# Patient Record
Sex: Male | Born: 1955 | Race: White | Hispanic: No | Marital: Married | Smoking: Former smoker
Health system: Southern US, Community
[De-identification: ages and names within clinical notes are randomized; demographics above are authoritative.]

## PROBLEM LIST (undated history)

## (undated) DIAGNOSIS — I10 Essential (primary) hypertension: Secondary | ICD-10-CM

## (undated) DIAGNOSIS — Z87891 Personal history of nicotine dependence: Secondary | ICD-10-CM

## (undated) DIAGNOSIS — IMO0002 Reserved for concepts with insufficient information to code with codable children: Secondary | ICD-10-CM

## (undated) DIAGNOSIS — Z86010 Personal history of colonic polyps: Secondary | ICD-10-CM

## (undated) DIAGNOSIS — F1011 Alcohol abuse, in remission: Secondary | ICD-10-CM

## (undated) DIAGNOSIS — M1710 Unilateral primary osteoarthritis, unspecified knee: Secondary | ICD-10-CM

## (undated) DIAGNOSIS — T7840XA Allergy, unspecified, initial encounter: Secondary | ICD-10-CM

## (undated) DIAGNOSIS — R7302 Impaired glucose tolerance (oral): Secondary | ICD-10-CM

## (undated) DIAGNOSIS — E782 Mixed hyperlipidemia: Secondary | ICD-10-CM

## (undated) DIAGNOSIS — G479 Sleep disorder, unspecified: Secondary | ICD-10-CM

## (undated) DIAGNOSIS — N529 Male erectile dysfunction, unspecified: Secondary | ICD-10-CM

## (undated) DIAGNOSIS — S335XXA Sprain of ligaments of lumbar spine, initial encounter: Secondary | ICD-10-CM

## (undated) DIAGNOSIS — E119 Type 2 diabetes mellitus without complications: Secondary | ICD-10-CM

## (undated) DIAGNOSIS — Z0000 Encounter for general adult medical examination without abnormal findings: Secondary | ICD-10-CM

## (undated) DIAGNOSIS — J309 Allergic rhinitis, unspecified: Secondary | ICD-10-CM

## (undated) DIAGNOSIS — E669 Obesity, unspecified: Secondary | ICD-10-CM

## (undated) DIAGNOSIS — F1010 Alcohol abuse, uncomplicated: Secondary | ICD-10-CM

## (undated) DIAGNOSIS — Z9889 Other specified postprocedural states: Secondary | ICD-10-CM

## (undated) DIAGNOSIS — K279 Peptic ulcer, site unspecified, unspecified as acute or chronic, without hemorrhage or perforation: Secondary | ICD-10-CM

## (undated) DIAGNOSIS — R7989 Other specified abnormal findings of blood chemistry: Secondary | ICD-10-CM

## (undated) DIAGNOSIS — F17200 Nicotine dependence, unspecified, uncomplicated: Secondary | ICD-10-CM

## (undated) DIAGNOSIS — J40 Bronchitis, not specified as acute or chronic: Secondary | ICD-10-CM

## (undated) HISTORY — DX: Nicotine dependence, unspecified, uncomplicated: F17.200

## (undated) HISTORY — DX: Sleep disorder, unspecified: G47.9

## (undated) HISTORY — DX: Impaired glucose tolerance (oral): R73.02

## (undated) HISTORY — DX: Obesity, unspecified: E66.9

## (undated) HISTORY — DX: Other specified abnormal findings of blood chemistry: R79.89

## (undated) HISTORY — DX: Peptic ulcer, site unspecified, unspecified as acute or chronic, without hemorrhage or perforation: K27.9

## (undated) HISTORY — DX: Other specified postprocedural states: Z98.890

## (undated) HISTORY — DX: Personal history of colonic polyps: Z86.010

## (undated) HISTORY — DX: Essential (primary) hypertension: I10

## (undated) HISTORY — DX: Allergic rhinitis, unspecified: J30.9

## (undated) HISTORY — DX: Sprain of ligaments of lumbar spine, initial encounter: S33.5XXA

## (undated) HISTORY — DX: Bronchitis, not specified as acute or chronic: J40

## (undated) HISTORY — DX: Mixed hyperlipidemia: E78.2

## (undated) HISTORY — DX: Unilateral primary osteoarthritis, unspecified knee: M17.10

## (undated) HISTORY — DX: Encounter for general adult medical examination without abnormal findings: Z00.00

## (undated) HISTORY — DX: Alcohol abuse, uncomplicated: F10.10

## (undated) HISTORY — DX: Reserved for concepts with insufficient information to code with codable children: IMO0002

## (undated) HISTORY — DX: Male erectile dysfunction, unspecified: N52.9

## (undated) HISTORY — DX: Personal history of nicotine dependence: Z87.891

## (undated) HISTORY — DX: Alcohol abuse, in remission: F10.11

## (undated) HISTORY — DX: Type 2 diabetes mellitus without complications: E11.9

## (undated) HISTORY — DX: Allergy, unspecified, initial encounter: T78.40XA

---

## 1980-08-18 DIAGNOSIS — IMO0002 Reserved for concepts with insufficient information to code with codable children: Secondary | ICD-10-CM

## 1980-08-18 HISTORY — DX: Reserved for concepts with insufficient information to code with codable children: IMO0002

## 1980-08-18 HISTORY — PX: VASECTOMY: SHX75

## 1985-08-18 HISTORY — PX: OTHER SURGICAL HISTORY: SHX169

## 1998-08-18 HISTORY — PX: ROTATOR CUFF REPAIR: SHX139

## 2006-06-27 ENCOUNTER — Ambulatory Visit (HOSPITAL_COMMUNITY): Admission: RE | Admit: 2006-06-27 | Discharge: 2006-06-27 | Payer: Self-pay | Admitting: Orthopedic Surgery

## 2006-08-05 ENCOUNTER — Ambulatory Visit (HOSPITAL_BASED_OUTPATIENT_CLINIC_OR_DEPARTMENT_OTHER): Admission: RE | Admit: 2006-08-05 | Discharge: 2006-08-05 | Payer: Self-pay | Admitting: Emergency Medicine

## 2006-08-16 ENCOUNTER — Ambulatory Visit: Payer: Self-pay | Admitting: Internal Medicine

## 2006-08-18 HISTORY — PX: OTHER SURGICAL HISTORY: SHX169

## 2006-11-16 ENCOUNTER — Ambulatory Visit (HOSPITAL_COMMUNITY): Admission: RE | Admit: 2006-11-16 | Discharge: 2006-11-16 | Payer: Self-pay | Admitting: Orthopedic Surgery

## 2007-01-19 ENCOUNTER — Ambulatory Visit: Payer: Self-pay | Admitting: Internal Medicine

## 2007-02-15 ENCOUNTER — Encounter: Payer: Self-pay | Admitting: Internal Medicine

## 2007-02-15 DIAGNOSIS — E669 Obesity, unspecified: Secondary | ICD-10-CM | POA: Insufficient documentation

## 2007-02-15 DIAGNOSIS — Z9889 Other specified postprocedural states: Secondary | ICD-10-CM

## 2007-02-15 DIAGNOSIS — K279 Peptic ulcer, site unspecified, unspecified as acute or chronic, without hemorrhage or perforation: Secondary | ICD-10-CM | POA: Insufficient documentation

## 2007-02-15 DIAGNOSIS — I10 Essential (primary) hypertension: Secondary | ICD-10-CM | POA: Insufficient documentation

## 2007-02-15 DIAGNOSIS — J309 Allergic rhinitis, unspecified: Secondary | ICD-10-CM

## 2007-02-15 HISTORY — DX: Peptic ulcer, site unspecified, unspecified as acute or chronic, without hemorrhage or perforation: K27.9

## 2007-02-15 HISTORY — DX: Allergic rhinitis, unspecified: J30.9

## 2007-02-15 HISTORY — DX: Essential (primary) hypertension: I10

## 2007-02-15 HISTORY — DX: Obesity, unspecified: E66.9

## 2007-02-15 HISTORY — DX: Other specified postprocedural states: Z98.890

## 2007-05-04 ENCOUNTER — Ambulatory Visit: Payer: Self-pay | Admitting: Internal Medicine

## 2007-08-13 ENCOUNTER — Encounter: Payer: Self-pay | Admitting: Internal Medicine

## 2007-09-28 ENCOUNTER — Ambulatory Visit: Payer: Self-pay | Admitting: Internal Medicine

## 2007-09-28 DIAGNOSIS — R252 Cramp and spasm: Secondary | ICD-10-CM

## 2007-09-28 DIAGNOSIS — J209 Acute bronchitis, unspecified: Secondary | ICD-10-CM

## 2007-09-28 LAB — CONVERTED CEMR LAB
BUN: 7 mg/dL (ref 6–23)
CO2: 30 meq/L (ref 19–32)
Calcium: 9.1 mg/dL (ref 8.4–10.5)
GFR calc Af Amer: 132 mL/min
GFR calc non Af Amer: 109 mL/min
Glucose, Bld: 136 mg/dL — ABNORMAL HIGH (ref 70–99)
Potassium: 4.2 meq/L (ref 3.5–5.1)

## 2008-01-25 ENCOUNTER — Telehealth: Payer: Self-pay | Admitting: Internal Medicine

## 2008-01-28 ENCOUNTER — Ambulatory Visit: Payer: Self-pay | Admitting: Internal Medicine

## 2008-01-28 LAB — CONVERTED CEMR LAB
Albumin: 4.2 g/dL (ref 3.5–5.2)
Alkaline Phosphatase: 66 units/L (ref 39–117)
BUN: 13 mg/dL (ref 6–23)
Basophils Relative: 0.9 % (ref 0.0–1.0)
Bilirubin Urine: NEGATIVE
Calcium: 9.3 mg/dL (ref 8.4–10.5)
Creatinine, Ser: 0.8 mg/dL (ref 0.4–1.5)
Direct LDL: 101.3 mg/dL
Eosinophils Relative: 2.8 % (ref 0.0–5.0)
GFR calc Af Amer: 132 mL/min
Glucose, Bld: 119 mg/dL — ABNORMAL HIGH (ref 70–99)
HCT: 44.3 % (ref 39.0–52.0)
Hemoglobin: 15.3 g/dL (ref 13.0–17.0)
MCV: 95.7 fL (ref 78.0–100.0)
Monocytes Absolute: 0.7 10*3/uL (ref 0.1–1.0)
Monocytes Relative: 8.5 % (ref 3.0–12.0)
Neutro Abs: 4.3 10*3/uL (ref 1.4–7.7)
Nitrite: NEGATIVE
Potassium: 3.6 meq/L (ref 3.5–5.1)
Specific Gravity, Urine: >=1.03
TSH: 0.93 microintl units/mL (ref 0.35–5.50)
Total CHOL/HDL Ratio: 5.6
Total Protein: 7.2 g/dL (ref 6.0–8.3)
Urobilinogen, UA: 0.2
WBC: 8.3 10*3/uL (ref 4.5–10.5)

## 2008-02-07 ENCOUNTER — Ambulatory Visit: Payer: Self-pay | Admitting: Internal Medicine

## 2008-08-07 ENCOUNTER — Ambulatory Visit: Payer: Self-pay | Admitting: Internal Medicine

## 2008-08-07 DIAGNOSIS — J069 Acute upper respiratory infection, unspecified: Secondary | ICD-10-CM | POA: Insufficient documentation

## 2008-08-14 ENCOUNTER — Telehealth: Payer: Self-pay | Admitting: Family Medicine

## 2009-05-10 ENCOUNTER — Ambulatory Visit: Payer: Self-pay | Admitting: Internal Medicine

## 2009-05-10 DIAGNOSIS — L03119 Cellulitis of unspecified part of limb: Secondary | ICD-10-CM

## 2009-05-10 DIAGNOSIS — L02419 Cutaneous abscess of limb, unspecified: Secondary | ICD-10-CM | POA: Insufficient documentation

## 2010-07-03 ENCOUNTER — Telehealth: Payer: Self-pay | Admitting: Family Medicine

## 2010-07-04 ENCOUNTER — Ambulatory Visit: Payer: Self-pay | Admitting: Family Medicine

## 2010-07-04 DIAGNOSIS — R7309 Other abnormal glucose: Secondary | ICD-10-CM

## 2010-07-04 DIAGNOSIS — Z87891 Personal history of nicotine dependence: Secondary | ICD-10-CM

## 2010-07-04 DIAGNOSIS — E782 Mixed hyperlipidemia: Secondary | ICD-10-CM

## 2010-07-04 DIAGNOSIS — F17200 Nicotine dependence, unspecified, uncomplicated: Secondary | ICD-10-CM

## 2010-07-04 HISTORY — DX: Nicotine dependence, unspecified, uncomplicated: F17.200

## 2010-07-04 HISTORY — DX: Personal history of nicotine dependence: Z87.891

## 2010-07-04 HISTORY — DX: Mixed hyperlipidemia: E78.2

## 2010-07-05 ENCOUNTER — Telehealth: Payer: Self-pay | Admitting: Family Medicine

## 2010-09-17 NOTE — Progress Notes (Signed)
Summary: switch pcp  Phone Note Call from Patient Call back at [phone]   Caller: Patient Call For: Gordy Savers  MD Summary of Call: pt would like to switch from dr k to dr blyth. Can I switch pt? Initial call taken by: Heron Sabins,  July 05, 2010 3:45 PM  Follow-up for Phone Call        yes Follow-up by: Gordy Savers  MD,  July 05, 2010 4:03 PM

## 2010-09-17 NOTE — Progress Notes (Signed)
Summary: PCP Change  Phone Note Other Incoming   Summary of Call: Wife wants both of them to be seen By Dr. Abner Greenspan & has requested change of PCP 07-03-2010.  Initial call taken by: Georgian Co,  July 03, 2010 11:37 AM  Follow-up for Phone Call        ok Follow-up by: Gordy Savers  MD,  July 03, 2010 12:04 PM  Additional Follow-up for Phone Call Additional follow up Details #1::        ok,  Additional Follow-up by: Danise Edge MD,  July 03, 2010 1:55 PM

## 2010-09-17 NOTE — Assessment & Plan Note (Signed)
Summary: New To Est//alp   Vital Signs:  Patient profile:   55 year old male Height:      69 inches (175.26 cm) Weight:      232 pounds (105.45 kg) BMI:     34.38 O2 Sat:      94 % on Room air Temp:     98.7 degrees F (37.06 degrees C) oral Pulse rate:   118 / minute BP sitting:   178 / 94  (left arm) Cuff size:   regular  Vitals Entered By: Josph Macho RMA (July 04, 2010 9:45 AM)  O2 Flow:  Room air CC: Establish new pt/ sinus and chest congestion, cough w/ phlegm (green) X off and on for 2 months/ CF Is Patient Diabetic? No   History of Present Illness: Patient is a 75-year-old Caucasian male in  today for worsening and persistent cough. his wife has recently begun here in our practice and he has decided he would like to do the same. He is a persistent smoker though he has cut back significantly and now smokes less than a pack per day. He has been struggling with sinus and chest congestion for roughly 2 months now his cough and his nose are productive of greenish phlegm. He become short of breath very easily minimal exertion. He is struggling with low-grade fevers chills, malaise myalgias, wheezing. Notes dyspnea on exertion and worsening cough occur when he exerts himself. He reports a long history of tachycardia and can feel when his heart races especially with exertion. Denies chest pressure. Denies any recent sore throat or ear pain no headache no nausea, vomiting, diarrhea or constipation. He has been taking multiple medications including NyQuil daytime and nighttime preparations which he has taken the last 24 hours. He has been taking some Tylenol arthritis for occasional back pain as well.  Preventive Screening-Counseling & Management  Alcohol-Tobacco     Smoking Cessation Counseling: YES  Caffeine-Diet-Exercise     Does Patient Exercise: no      Drug Use:  no.    Problems Prior to Update: 1)  Tobacco Abuse  (ICD-305.1) 2)  Mixed Hyperlipidemia  (ICD-272.2) 3)   Hyperglycemia  (ICD-790.29) 4)  Cellulitis, Legs  (ICD-682.6) 5)  Uri  (ICD-465.9) 6)  Physical Examination  (ICD-V70.0) 7)  Leg Cramps  (ICD-729.82) 8)  Acute Bronchitis  (ICD-466.0) 9)  Nissen Fundoplication, Hx of  (ICD-V15.2) 10)  Exogenous Obesity  (ICD-278.00) 11)  Peptic Ulcer Disease  (ICD-533.90) 12)  Hypertension  (ICD-401.9) 13)  Allergic Rhinitis  (ICD-477.9)  Current Problems (verified): 1)  Mixed Hyperlipidemia  (ICD-272.2) 2)  Hyperglycemia  (ICD-790.29) 3)  Cellulitis, Legs  (ICD-682.6) 4)  Uri  (ICD-465.9) 5)  Physical Examination  (ICD-V70.0) 6)  Leg Cramps  (ICD-729.82) 7)  Acute Bronchitis  (ICD-466.0) 8)  Nissen Fundoplication, Hx of  (ICD-V15.2) 9)  Exogenous Obesity  (ICD-278.00) 10)  Peptic Ulcer Disease  (ICD-533.90) 11)  Hypertension  (ICD-401.9) 12)  Allergic Rhinitis  (ICD-477.9)  Medications Prior to Update: 1)  Lisinopril-Hydrochlorothiazide 20-25 Mg Tabs (Lisinopril-Hydrochlorothiazide) .Marland Kitchen.. 1 Once Daily 2)  Toprol Xl 100 Mg Tb24 (Metoprolol Succinate) .... Take 1 Tablet By Mouth Once A Day 3)  Chantix Starting Month Pak 0.5 Mg X 11 & 1 Mg X 42  Misc (Varenicline Tartrate) 4)  Cephalexin 500 Mg Caps (Cephalexin) .... One Twice Daily  Current Medications (verified): 1)  Lisinopril-Hydrochlorothiazide 20-25 Mg Tabs (Lisinopril-Hydrochlorothiazide) .Marland Kitchen.. 1 Once Daily 2)  Toprol Xl 100 Mg Tb24 (Metoprolol Succinate) .Marland KitchenMarland KitchenMarland Kitchen  Take 1 Tablet By Mouth Once A Day 3)  Chantix Starting Month Pak 0.5 Mg X 11 & 1 Mg X 42  Misc (Varenicline Tartrate) 4)  Cephalexin 500 Mg Caps (Cephalexin) .... 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 No apparent alopecia or balding. Eyes:  No corneal or conjunctival inflammation noted. EOMI.  Ears:  External ear exam shows no significant lesions or deformities.  Otoscopic examination reveals clear canals, tympanic membranes are intact bilaterally without bulging, retraction, inflammation or discharge. Hearing is grossly normal bilaterally. Nose:  External nasal examination shows no deformity or inflammation. Nasal mucosa are pink and moist without lesions or exudates. Mouth:  Oral mucosa and oropharynx without lesions or exudates.  Difficult to visualize posterior oropharynx. Neck:  No deformities, masses, or tenderness noted. Lungs:  no wheezes, R decreased breath sounds, and L decreased breath sounds.   Heart:  Normal rate and regular rhythm. S1 and S2 normal without gallop, murmur, click, rub or other extra sounds. Abdomen:  Bowel sounds positive,abdomen soft and non-tender without masses, organomegaly or hernias noted. Obese Msk:  No deformity or scoliosis noted of thoracic or lumbar spine.   Pulses:  R and L carotid,dorsalis pedis and posterior tibial pulses are full and equal bilaterally Extremities:  No clubbing, cyanosis, edema, or deformity noted  Neurologic:  No cranial nerve deficits noted. Station and gait are normal. Plantar reflexes are down-going bilaterally. DTRs are symmetrical throughout. Sensory, motor and  coordinative functions appear intact. Cervical Nodes:  No lymphadenopathy noted Psych:  Cognition and judgment appear intact. Alert and cooperative with normal attention span and concentration. No apparent delusions, illusions, hallucinations   Impression & Recommendations:  Problem # 1:  ACUTE BRONCHITIS (ICD-466.0)  The following medications were removed from the medication list:    Cephalexin 500 Mg Caps (Cephalexin) ..... One twice daily His updated medication list for this problem includes:    Proair Hfa 108 (90 Base) Mcg/act Aers (Albuterol sulfate) .Marland Kitchen... 1-2 puffs by mouth q 4-6 hours as needed sob/wheeze/cough    Cefdinir 300 Mg Caps (Cefdinir) .Marland Kitchen... 1 cap by mouth two times a day x 10 days    Mucinex 600 Mg Xr12h-tab (Guaifenesin) .Marland Kitchen... 1 tab by mouth two times a day x 10 days    Tussionex Pennkinetic Er 10-8 Mg/7ml Lqcr (Hydrocod polst-chlorphen polst) .Marland Kitchen... 1 tsp by mouth at bedtime as needed cough Call if any concerns.  Problem # 2:  TOBACCO ABUSE (ICD-305.1)  The following medications were removed from the medication list:    Chantix Starting Month Pak 0.5 Mg X 11 & 1 Mg X 42 Misc (Varenicline tartrate) His updated medication list for this problem includes:    Nicotrol 10 Mg Inha (Nicotine) .Marland KitchenMarland KitchenMarland KitchenMarland Kitchen  1 cartridge by mouth q 2 hours as needed cravings. max of 10 a day  Orders: Tobacco use cessation intermediate 3-10 minutes (16109) Patient is willing to try to quit smoking again.  Problem # 3:  MIXED HYPERLIPIDEMIA (ICD-272.2) Avoid trans fats and check an FLP prior to next visit  Problem # 4:  HYPERTENSION (ICD-401.9)  His updated medication list for this problem includes:    Lisinopril-hydrochlorothiazide 20-25 Mg Tabs (Lisinopril-hydrochlorothiazide) .Marland Kitchen... 1 once daily    Toprol Xl 100 Mg Tb24 (Metoprolol succinate) .Marland Kitchen... Take 1 tablet by mouth at bedtime with the 50 mg tab    Metoprolol Succinate 50 Mg Xr24h-tab (Metoprolol succinate) .Marland Kitchen... 1 tab by mouth at bedtime  with the 100mg  tab minimize sodium  Problem # 5:  HYPERGLYCEMIA (ICD-790.29) Avoid simple carbs, check renal panel prior to next visit  Complete Medication List: 1)  Lisinopril-hydrochlorothiazide 20-25 Mg Tabs (Lisinopril-hydrochlorothiazide) .Marland Kitchen.. 1 once daily 2)  Toprol Xl 100 Mg Tb24 (Metoprolol succinate) .... Take 1 tablet by mouth at bedtime with the 50 mg tab 3)  Proair Hfa 108 (90 Base) Mcg/act Aers (Albuterol sulfate) .Marland Kitchen.. 1-2 puffs by mouth q 4-6 hours as needed sob/wheeze/cough 4)  Cefdinir 300 Mg Caps (Cefdinir) .Marland Kitchen.. 1 cap by mouth two times a day x 10 days 5)  Mucinex 600 Mg Xr12h-tab (Guaifenesin) .Marland Kitchen.. 1 tab by mouth two times a day x 10 days 6)  Nicotrol 10 Mg Inha (Nicotine) .Marland Kitchen.. 1 cartridge by mouth q 2 hours as needed cravings. max of 10 a day 7)  Metoprolol Succinate 50 Mg Xr24h-tab (Metoprolol succinate) .Marland Kitchen.. 1 tab by mouth at bedtime with the 100mg  tab 8)  Tussionex Pennkinetic Er 10-8 Mg/80ml Lqcr (Hydrocod polst-chlorphen polst) .Marland Kitchen.. 1 tsp by mouth at bedtime as needed cough  Patient Instructions: 1)  Please schedule a follow-up appointment in 1 month or as needed 2)  Tobacco is very bad for your health and your loved ones ! You should stop smoking !  3)  Stop smoking tips: Choose a quit date. Cut down before the quit date. Decide what you will do as a substitute when you feel the urge to smoke(gum, toothpick, exercise).  4)  It is important that you exercise reguarly at least 20 minutes 5 times a week. If you develop chest pain, have severe difficulty breathing, or feel very tired, stop exercising immediately and seek medical attention.  5)  You need to lose weight. Consider a lower calorie diet and regular exercise.  6)  It is not healthy for men to drink more then 2-3 drinks per day or for women to drink more then 1-2 drinks per day.  7)  Take 650 - 1000 mg of tylenol every 4-6 hours as needed for relief of pain or comfort of fever. Avoid taking more than 4000 mg in a  24 hour period( can cause liver damage in higher doses).  8)  Take your antibiotic as prescribed until ALL of it is gone, but stop if you develop a rash or swelling and contact our office as soon as possible.  9)  Acute Bronchitis symptoms for less then 10 days are not  helped by antibiotics. Take over the counter cough medications. Call if no improvement in 5-7 days, sooner if increasing cough, fever, or new symptoms ( shortness of breath, chest pain) .  10)  BMP prior to visit, ICD-9: 401.9 11)  Hepatic Panel prior to visit ICD-9: 401.9 12)  Lipid panel prior to visit ICD-9 :  272.4 13)  TSH prior to visit ICD-9 : 401.9 14)  CBC w/ Diff prior to visit ICD-9 : 401.9 15)  Urine- dip prior to visit ICD-9 : 401.9 16)  PSA prior to visit ICD-9: v70.0 17)  HgBA1c prior to visit  ICD-9: 790.29 18)  Urine Microalbumin prior to visit ICD-9 : 790.29 19)  Minimize white or simple carbs Prescriptions: TUSSIONEX PENNKINETIC ER 10-8 MG/5ML LQCR (HYDROCOD POLST-CHLORPHEN POLST) 1 tsp by mouth at bedtime as needed cough  #4 oz x 1   Entered and Authorized by:   Danise Edge MD   Signed by:   Danise Edge MD on 07/04/2010   Method used:   Print then Give to Patient   RxID:   [id number] TOPROL XL 100 MG TB24 (METOPROLOL SUCCINATE) Take 1 tablet by mouth at bedtime with the 50 mg tab  #90 x 1   Entered and Authorized by:   Danise Edge MD   Signed by:   Danise Edge MD on 07/04/2010   Method used:   Electronically to        [facility] [address] [id number] (retail)       [address]       [place], [place]  [address]       Ph: [phone]       Fax: [phone]   RxID:   [id number] METOPROLOL SUCCINATE 50 MG XR24H-TAB (METOPROLOL SUCCINATE) 1 tab by mouth at bedtime with the 100mg  tab  #90 x 1   Entered and Authorized by:   Danise Edge MD   Signed by:   Danise Edge MD on 07/04/2010   Method used:   Electronically to        [id number] ([facility]ail)       [address]       [place], [place]  [address]       Ph: [phone]       Fax: [phone]   RxID:   [id number] NICOTROL 10 MG INHA (NICOTINE) 1 cartridge by mouth q 2 hours as needed cravings. Max of 10 a day  #300 x 1   Entered and Authorized by:   Danise Edge MD   Signed by:   Danise Edge MD on 07/04/2010   Method used:   Electronically to        [facility] [phone][facility] (retail)       [address]       [place], [place]  [address]       Ph: [phone]       Fax: [phone]   RxID:   [id number] CEFDINIR 300 MG CAPS (CEFDINIR) 1 cap by mouth two times a day x 10 days  #20 x 0   Entered and Authorized by:   Danise Edge MD   Signed by:   Danise Edge MD on 07/04/2010   Method used:   [facility]ronically to        [facility] [phone][facility] (retail)       [address]       [place], [place]  [address]       Ph: [phone]       Fax: [phone]   RxID:   [id number] PROAIR HFA 108 (90 BASE) MCG/ACT AERS (  ALBUTEROL SULFATE) 1-2 puffs by mouth q 4-6 hours as needed sob/wheeze/cough  #1 x 0   Entered and Authorized by:   Danise Edge MD   Signed by:   Danise Edge MD on 07/04/2010   Method used:   Samples Given   RxID:   [id number]    Orders Added: 1)  Tobacco use cessation intermediate 3-10 minutes [99406] 2)  Est. Patient Level IV [[address]]

## 2010-10-17 ENCOUNTER — Ambulatory Visit (INDEPENDENT_AMBULATORY_CARE_PROVIDER_SITE_OTHER): Payer: BC Managed Care – PPO | Admitting: Family Medicine

## 2010-10-17 ENCOUNTER — Encounter: Payer: Self-pay | Admitting: Family Medicine

## 2010-10-17 DIAGNOSIS — R05 Cough: Secondary | ICD-10-CM | POA: Insufficient documentation

## 2010-10-17 DIAGNOSIS — S335XXA Sprain of ligaments of lumbar spine, initial encounter: Secondary | ICD-10-CM

## 2010-10-17 DIAGNOSIS — F17200 Nicotine dependence, unspecified, uncomplicated: Secondary | ICD-10-CM

## 2010-10-17 DIAGNOSIS — Z23 Encounter for immunization: Secondary | ICD-10-CM

## 2010-10-17 HISTORY — DX: Sprain of ligaments of lumbar spine, initial encounter: S33.5XXA

## 2010-10-21 ENCOUNTER — Encounter: Payer: Self-pay | Admitting: *Deleted

## 2010-10-23 ENCOUNTER — Ambulatory Visit (HOSPITAL_BASED_OUTPATIENT_CLINIC_OR_DEPARTMENT_OTHER)
Admission: RE | Admit: 2010-10-23 | Discharge: 2010-10-23 | Disposition: A | Discharge status: Home / Self Care | Payer: BC Managed Care – PPO | Source: Ambulatory Visit | Attending: Family Medicine | Admitting: Family Medicine

## 2010-10-23 ENCOUNTER — Encounter: Payer: Self-pay | Admitting: Family Medicine

## 2010-10-23 ENCOUNTER — Other Ambulatory Visit: Payer: Self-pay | Admitting: Family Medicine

## 2010-10-23 DIAGNOSIS — M549 Dorsalgia, unspecified: Secondary | ICD-10-CM

## 2010-10-23 DIAGNOSIS — M47817 Spondylosis without myelopathy or radiculopathy, lumbosacral region: Secondary | ICD-10-CM | POA: Insufficient documentation

## 2010-10-24 DIAGNOSIS — E119 Type 2 diabetes mellitus without complications: Secondary | ICD-10-CM

## 2010-10-24 DIAGNOSIS — E1169 Type 2 diabetes mellitus with other specified complication: Secondary | ICD-10-CM | POA: Insufficient documentation

## 2010-10-24 DIAGNOSIS — E669 Obesity, unspecified: Secondary | ICD-10-CM | POA: Insufficient documentation

## 2010-10-24 HISTORY — DX: Type 2 diabetes mellitus without complications: E11.9

## 2010-10-24 LAB — CONVERTED CEMR LAB
ALT: 25 units/L (ref 0–53)
AST: 17 units/L (ref 0–37)
Alkaline Phosphatase: 88 units/L (ref 39–117)
BUN: 15 mg/dL (ref 6–23)
Bacteria, UA: NONE SEEN
Basophils Relative: 1 % (ref 0–1)
Blood, UA: NEGATIVE
Casts: NONE SEEN /lpf
Cholesterol: 237 mg/dL — ABNORMAL HIGH (ref 0–200)
Creatinine, Ser: 0.85 mg/dL (ref 0.40–1.50)
Eosinophils Absolute: 0.2 10*3/uL (ref 0.0–0.7)
Indirect Bilirubin: 0.6 mg/dL (ref 0.0–0.9)
MCHC: 33.9 g/dL (ref 30.0–36.0)
MCV: 94.6 fL (ref 78.0–100.0)
Monocytes Absolute: 0.7 10*3/uL (ref 0.1–1.0)
Monocytes Relative: 8 % (ref 3–12)
Neutrophils Relative %: 58 % (ref 43–77)
Potassium: 4.7 meq/L (ref 3.5–5.3)
RBC: 5.33 M/uL (ref 4.22–5.81)
Total Protein: 7.7 g/dL (ref 6.0–8.3)
Triglycerides: 281 mg/dL — ABNORMAL HIGH (ref <150)
Urine Glucose: >1000 mg/dL — AB
pH: 6 (ref 5.0–8.0)

## 2010-10-24 NOTE — Assessment & Plan Note (Signed)
Summary: pain in back radiating into leg also needs bloodwork/dt   Vital Signs:  Patient profile:   55 year old male Height:      69 inches Weight:      230 pounds BMI:     34.09 O2 Sat:      94 % on Room air Temp:     98.3 degrees F Pulse rate:   107 / minute BP sitting:   139 / 91  (right arm) Cuff size:   large  Vitals Entered By: Francee Piccolo CMA Duncan Dull) (October 17, 2010 8:55 AM)  O2 Flow:  Room air CC: back pain//SP Is Patient Diabetic? No   History of Present Illness: Patient is in today for follow up on multiple medical concerns. His breathing is much better, he is coughing less and breathing better. Is only smoking 3 cigarettes or less a day. He is denying CP/palp/SOB at rest/fevers/chills/GI or GU c/o. His biggest concern today is some low back pain which started about 3 days ago. He as working in the yard doing some heavy lifting and his lower back began to ache that night. it was around the upper lumbar region and radiated to b/l flanks. on Saturday morning he woke up with the pain wrapping around actually to his abdomen both sides as well. He denies any incontinence or radicular symptoms although he does note some long standing issues with achiong in b/l legs which is unchanged. He reports position changes can make the pain significantly better and worse. Certain twists or bending can bring on sharper pains and when he changes positions again it improves back to a duller achy feeling which has been more persistent. He is moving his bowels daily without difficulty no bloody or tarry stool.   Current Medications (verified): 1)  Lisinopril-Hydrochlorothiazide 20-25 Mg Tabs (Lisinopril-Hydrochlorothiazide) .Marland Kitchen.. 1 Once Daily 2)  Toprol Xl 100 Mg Tb24 (Metoprolol Succinate) .... Take 1 Tablet By Mouth At Bedtime With The 50 Mg Tab 3)  Proair Hfa 108 (90 Base) Mcg/act Aers (Albuterol Sulfate) .Marland Kitchen.. 1-2 Puffs By Mouth Q 4-6 Hours As Needed Sob/wheeze/cough 4)  Nicotrol 10 Mg Inha  (Nicotine) .Marland Kitchen.. 1 Cartridge By Mouth Q 2 Hours As Needed Cravings. Max of 10 A Day 5)  Metoprolol Succinate 50 Mg Xr24h-Tab (Metoprolol Succinate) .Marland Kitchen.. 1 Tab By Mouth At Bedtime With The 100mg  Tab  Allergies (verified): No Known Drug Allergies  Past History:  Past medical history reviewed for relevance to current acute and chronic problems. Social history (including risk factors) reviewed for relevance to current acute and chronic problems.  Past Medical History: Reviewed history from 02/07/2008 and no changes required. Allergic rhinitis Hypertension Obesity Peptic ulcer disease 1982 impaired glucose tolerance  Social History: Reviewed history from 07/04/2010 and no changes required. 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 No apparent alopecia or balding. Mouth:  Oral mucosa and oropharynx without lesions or exudates.  Teeth in good repair. Lungs:  Normal respiratory effort, chest expands symmetrically. Lungs are clear to auscultation, no crackles or wheezes. Heart:  Normal rate and regular rhythm. S1 and S2 normal without gallop, murmur, click, rub or other extra sounds. Abdomen:  Bowel sounds positive,abdomen soft  and non-tender without masses, organomegaly or hernias noted. Extremities:  No clubbing, cyanosis, edema, or deformity noted with normal full range of motion of all joints.   Psych:  Cognition and judgment appear intact. Alert and cooperative with normal attention span and concentration. No apparent delusions, illusions, hallucinations   Impression & Recommendations:  Problem # 1:  BACK PAIN, LUMBAR  (ICD-724.2)  His updated medication list for this problem includes:    Cyclobenzaprine Hcl 10 Mg Tabs (Cyclobenzaprine hcl) .Marland Kitchen... 1 tab by mouth three times a day as needed pain may cause sedation so reserve for at bedtime use on  most days  Orders: T-PSA ([address]-60454) T-CBC w/Diff (09811-91478) T-Urinalysis (29562-13086) Radiology Referral (Radiology) Given 2 sample boxes Flector patches, to try and can alternate this with advil depending which one gives him relief. Call if symptoms worsen  Problem # 2:  TOBACCO ABUSE (ICD-305.1)  His updated medication list for this problem includes:    Nicotrol 10 Mg Inha (Nicotine) .Marland Kitchen... 1 cartridge by mouth q 2 hours as needed cravings. max of 10 a day Has cut down to roughly 3 or less cigarettes daily, he is committed to quitting completely as he is feeling better since he cut back  Orders: Tobacco use cessation intermediate 3-10 minutes (57846)  Problem # 3:  MIXED HYPERLIPIDEMIA (ICD-272.2)  Orders: T-Lipid Profile (96295-28413) Avoid trans fats and recheck levels. encouraged increased activity  Problem # 4:  HYPERTENSION (ICD-401.9)  His updated medication list for this problem includes:    Lisinopril-hydrochlorothiazide 20-25 Mg Tabs (Lisinopril-hydrochlorothiazide) .Marland Kitchen... 1 once daily    Toprol Xl 100 Mg Tb24 (Metoprolol succinate) .Marland Kitchen... Take 1 tablet by mouth at bedtime with the 50 mg tab    Metoprolol Succinate 50 Mg Xr24h-tab (Metoprolol succinate) .Marland Kitchen... 1 tab by mouth at bedtime with the 100mg  tab  Orders: T-Basic Metabolic Panel ([id number]) T-Hepatic Function [id number]) Well controlled, no changes  Problem # 5:  Preventive Health Care (ICD-V70.0) Given Tdap and flu shot today  Complete Medication List: 1)  Lisinopril-hydrochlorothiazide 20-25 Mg Tabs (Lisinopril-hydrochlorothiazide) .Marland Kitchen.. 1 once daily 2)  Toprol Xl 100 Mg Tb24 (Metoprolol succinate) .... Take 1 tablet by mouth at bedtime with the 50 mg tab 3)  Proair  Hfa 108 (90 Base) Mcg/act Aers (Albuterol sulfate) .Marland Kitchen.. 1-2 puffs by mouth q 4-6 hours as needed sob/wheeze/cough 4)  Nicotrol 10 Mg Inha (Nicotine) .Marland Kitchen.. 1 cartridge by mouth q 2 hours as needed cravings. max of 10 a day 5)  Metoprolol Succinate 50 Mg Xr24h-tab (Metoprolol succinate) .Marland Kitchen.. 1 tab by mouth at bedtime with the 100mg  tab 6)  Cyclobenzaprine Hcl 10 Mg Tabs (Cyclobenzaprine hcl) .Marland Kitchen.. 1 tab by mouth three times a day as needed pain may cause sedation so reserve for at bedtime use on  most days 7)  Flector 1.3 % Ptch (Diclofenac epolamine) .Marland Kitchen.. 1 patch to affected area topically q 12 hours as needed  Other Orders: T-TSH ([address]956-38756) Tdap => 46yrs IM (43329) Admin 1st Vaccine (51884) Flu Vaccine 11yrs + (16606) Admin of Any Addtl Vaccine (30160)  Patient Instructions: 1)  Please schedule a follow-up appointment in 1 to 2 months 2)  apply moist heat and stretch daily as tolerated Prescriptions: FLECTOR 1.3 % PTCH (DICLOFENAC EPOLAMINE) 1 patch to affected area topically q 12 hours as needed  #60 x 1   Entered and Authorized by:   Danise Edge MD   Signed by:   Danise Edge MD on 10/17/2010   Method used:   [facility] to  [id number]       [address]       [place], [place]  [address]       Ph: [phone]       Fax: [phone]   RxID:   [id number] CYCLOBENZAPRINE HCL 10 MG TABS (CYCLOBENZAPRINE HCL) 1 tab by mouth three times a day as needed pain May cause sedation so reserve for at bedtime use on  most days  #40 x 1   Entered and Authorized by:   Danise Edge MD   Signed by:   Danise Edge MD on 10/17/2010   Method used:   [facility] to        [facility] [phone]* (retail)       [address]       [place], [place]  [address]       Ph: [phone]       Fax: [phone]   RxID:   [id number]    Orders Added: 1)  T-Basic Metabolic Panel [phone] 2)  T-Hepatic  Function [phone] 3)  T-Lipid Profile [80061-22930] 4)  T-TSH [37628-31517] 5)  T-PSA [61607-37106] 6)  T-CBC w/Diff [26948-54627] 7)  T-Urinalysis [81003-65000] 8)  Tdap => 32yrs IM [90715] 9)  Admin 1st Vaccine [90471] 10)  Flu Vaccine 59yrs + [03500] 11)  Admin of Any Addtl Vaccine [93818] 12)  Radiology Referral [Radiology] 13)  Tobacco use cessation intermediate 3-10 minutes [99406]   Immunizations Administered:  Tetanus Vaccine:    Vaccine Type: Tdap    Site: right deltoid    Mfr: GlaxoSmithKline    Dose: 0.5 ml    Route: IM    Given by: Francee Piccolo CMA (AAMA)    Exp. Date: 06/06/2012    Lot #: [id number]  Influenza Vaccine # 1:    Vaccine Type: Fluvax 3+    Site: left deltoid    Mfr: Sanofi Pasteur    Dose: 0.5 ml    Route: IM    Given by: Francee Piccolo CMA (AAMA)    Exp. Date: 02/15/2011    Lot #: [id number]    VIS given: 03/12/10 version given October 17, 2010.  Flu Vaccine Consent Questions:    Do you have a history of severe allergic reactions to this vaccine? no    Any prior history of allergic reactions to egg and/or gelatin? no    Do you have a sensitivity to the preservative Thimersol? no    Do you have a past history of Guillan-Barre Syndrome? no    Do you currently have an acute febrile illness? no    Have you ever had a severe reaction to latex? no    Vaccine information given and explained to patient? yes

## 2010-10-25 ENCOUNTER — Other Ambulatory Visit: Payer: Self-pay | Admitting: Family Medicine

## 2010-10-29 ENCOUNTER — Ambulatory Visit (INDEPENDENT_AMBULATORY_CARE_PROVIDER_SITE_OTHER): Payer: BC Managed Care – PPO | Admitting: Family Medicine

## 2010-10-29 ENCOUNTER — Encounter: Payer: Self-pay | Admitting: Family Medicine

## 2010-10-29 DIAGNOSIS — E782 Mixed hyperlipidemia: Secondary | ICD-10-CM

## 2010-10-29 DIAGNOSIS — S335XXA Sprain of ligaments of lumbar spine, initial encounter: Secondary | ICD-10-CM

## 2010-10-29 DIAGNOSIS — E119 Type 2 diabetes mellitus without complications: Secondary | ICD-10-CM

## 2010-10-29 DIAGNOSIS — F17200 Nicotine dependence, unspecified, uncomplicated: Secondary | ICD-10-CM

## 2010-10-29 LAB — CONVERTED CEMR LAB: Hgb A1c MFr Bld: 11.7 % — ABNORMAL HIGH (ref <5.7)

## 2010-11-04 ENCOUNTER — Encounter: Payer: Self-pay | Admitting: Family Medicine

## 2010-11-05 NOTE — Assessment & Plan Note (Signed)
Summary: Fu sugars/dt   Vital Signs:  Patient profile:   55 year old male Height:      69 inches (175.26 cm) Weight:      230.50 pounds (104.77 kg) O2 Sat:      92 % on Room air Temp:     98.6 degrees F (37.00 degrees C) oral Pulse rate:   112 / minute BP sitting:   140 / 82  (right arm) Cuff size:   large  Vitals Entered By: Josph Macho RMA (October 29, 2010 8:35 AM)  O2 Flow:  Room air CC: Follow-up visit on sugar levels/ CF Is Patient Diabetic? Yes   History of Present Illness:  patient is a 55 year old Caucasian male who is in today to discuss options regarding his new diagnosis of diabetes mellitus. His recent blood work showed a random blood sugar of 322 and hemoglobin A1c of 11.7. He is developing some persistent fatigue with that that was related to his activity level. He does acknowledge heavy water and soda consumption ut again thought that was related to his other medications. He denies  abdominal pain urinary symptoms or change in bowels. He is having persistent low back discomfort with some left hip pain it is helped tremendously with topical patches. No radicular symptoms or incontinence. he started metformin as directed the other night and is tolerating that fine. Denies any diarrhea Donnell upset. is in agreement to start checking sugars and decreasing his carbs. he did quit smoking in November 2011 although he doesn't knowledge he has she did on a couple of occasions denies chest pain, palpitations, shortness of breath, fevers, chills, recent illness GI or GU complaints.  Current Medications (verified): 1)  Lisinopril-Hydrochlorothiazide 20-25 Mg Tabs (Lisinopril-Hydrochlorothiazide) .Marland Kitchen.. 1 Once Daily 2)  Toprol Xl 100 Mg Tb24 (Metoprolol Succinate) .... Take 1 Tablet By Mouth At Bedtime With The 50 Mg Tab 3)  Proair Hfa 108 (90 Base) Mcg/act Aers (Albuterol Sulfate) .Marland Kitchen.. 1-2 Puffs By Mouth Q 4-6 Hours As Needed Sob/wheeze/cough 4)  Nicotrol 10 Mg Inha (Nicotine) .Marland Kitchen.. 1  Cartridge By Mouth Q 2 Hours As Needed Cravings. Max of 10 A Day 5)  Metoprolol Succinate 50 Mg Xr24h-Tab (Metoprolol Succinate) .Marland Kitchen.. 1 Tab By Mouth At Bedtime With The 100mg  Tab 6)  Cyclobenzaprine Hcl 10 Mg Tabs (Cyclobenzaprine Hcl) .Marland Kitchen.. 1 Tab By Mouth Three Times A Day As Needed Pain May Cause Sedation So Reserve For At Bedtime Use On  Most Days 7)  Flector 1.3 % Ptch (Diclofenac Epolamine) .Marland Kitchen.. 1 Patch To Affected Area Topically Q 12 Hours As Needed 8)  Metformin Hcl 500 Mg Tabs (Metformin Hcl) .... Two Times A Day  Allergies (verified): No Known Drug Allergies  Past History:  Past medical history reviewed for relevance to current acute and chronic problems. Social history (including risk factors) reviewed for relevance to current acute and chronic problems.  Past Medical History: Reviewed history from 02/07/2008 and no changes required. Allergic rhinitis Hypertension Obesity Peptic ulcer disease 1982 impaired glucose tolerance  Social History: Reviewed history from 07/04/2010 and no changes required. Married Current Smoker  3/4 to 1 ppd, quit 11/11 exc for occasional slip ups Occupation: ar Audiological scientist Alcohol use-yes, 3-5 daily, beer Drug use-no Regular exercise-no, gardens, physical work  Review of Systems      See HPI  Physical Exam  General:  Well-developed,well-nourished,in no acute distress; alert,appropriate and cooperative throughout examination Head:  Normocephalic and atraumatic without obvious abnormalities. No apparent alopecia or balding.  Mouth:  Oral mucosa and oropharynx without lesions or exudates.  Teeth in good repair. Neck:  No deformities, masses, or tenderness noted. Lungs:  Normal respiratory effort, chest expands symmetrically. Lungs are clear to auscultation, no crackles or wheezes. Heart:  Normal rate and regular rhythm. S1 and S2 normal without gallop, murmur, click, rub or other extra sounds. Abdomen:  Bowel sounds  positive,abdomen soft and non-tender without masses, organomegaly or hernias noted. Extremities:  No clubbing, cyanosis, edema, or deformity noted with normal full range of motion of all joints.   Psych:  Cognition and judgment appear intact. Alert and cooperative with normal attention span and concentration. No apparent delusions, illusions, hallucinations   Impression & Recommendations:  Problem # 1:  DM (ICD-250.00)  His updated medication list for this problem includes:    Lisinopril-hydrochlorothiazide 20-25 Mg Tabs (Lisinopril-hydrochlorothiazide) .Marland Kitchen... 1 once daily    Metformin Hcl 500 Mg Tabs (Metformin hcl) .Marland Kitchen..Marland Kitchen Two times a day    Tradjenta 5 Mg Tabs (Linagliptin) .Marland Kitchen... 1 tab by mouth two times a day Given a Freestyle Lite meter and asked to check his sugars two times a day and as needed then record and bring lob in to visit in 2 weeks, given a handout on the Dash diet and asked to avoid simple carbs, did offer a Nutrition consult but patient declined at this time.  Problem # 2:  LUMBAR STRAIN (ICD-847.2) Patient is simproving, no heavy lifting, continue topical treatments and notify us if symptoms worsen  Problem # 3:  TOBACCO ABUSE (ICD-305.1)  His updated medication list for this problem includes:    Nicotrol 10 Mg Inha (Nicotine) .Marland Kitchen... 1 cartridge by mouth q 2 hours as needed cravings. max of 10 a day Needs complete and permanent cessation, patient is in agreement he will try to proceed with this plan  Problem # 4:  MIXED HYPERLIPIDEMIA (ICD-272.2) Avoid trans fats, add fish oil and likely start a low dose statin at the next visit  Complete Medication List: 1)  Lisinopril-hydrochlorothiazide 20-25 Mg Tabs (Lisinopril-hydrochlorothiazide) .Marland Kitchen.. 1 once daily 2)  Toprol Xl 100 Mg Tb24 (Metoprolol succinate) .... Take 1 tablet by mouth at bedtime with the 50 mg tab 3)  Proair Hfa 108 (90 Base) Mcg/act Aers (Albuterol sulfate) .Marland Kitchen.. 1-2 puffs by mouth q 4-6 hours as needed  sob/wheeze/cough 4)  Nicotrol 10 Mg Inha (Nicotine) .Marland Kitchen.. 1 cartridge by mouth q 2 hours as needed cravings. max of 10 a day 5)  Metoprolol Succinate 50 Mg Xr24h-tab (Metoprolol succinate) .Marland Kitchen.. 1 tab by mouth at bedtime with the 100mg  tab 6)  Cyclobenzaprine Hcl 10 Mg Tabs (Cyclobenzaprine hcl) .Marland Kitchen.. 1 tab by mouth three times a day as needed pain may cause sedation so reserve for at bedtime use on  most days 7)  Flector 1.3 % Ptch (Diclofenac epolamine) .Marland Kitchen.. 1 patch to affected area topically q 12 hours as needed 8)  Metformin Hcl 500 Mg Tabs (Metformin hcl) .... Two times a day 9)  Tradjenta 5 Mg Tabs (Linagliptin) .Marland Kitchen.. 1 tab by mouth two times a day 10)  Freestyle Lancets Misc (Lancets) .... Two times a day 11)  Freestyle Test Strp (Glucose blood) .... Two times a day  Patient Instructions: 1)  Please schedule a follow-up appointment in 2 weeks.  2)  Check your blood sugars regularly. If your readings are usually above:  300 or below 70 you should contact our office.  3)  See your eye doctor yearly to check for diabetic eye  damage. 4)  Check your feet each night  for sore areas, calluses or signs of infection.  5)  Avoid soda and minimize carbs, use brown carbs instead of white  6)  Check Blood sugars in am prior to eating and in evening 1-2 hours after eating and record bring numbers to next visit. 7)  Try the Dash Diet. 8)  No Smoking 9)  No Trans fats Prescriptions: FREESTYLE TEST  STRP (GLUCOSE BLOOD) two times a day  #60 x 3   Entered by:   Josph Macho RMA   Authorized by:   Danise Edge MD   Signed by:   Josph Macho RMA on 10/29/2010   Method used:   Electronically to        [facility]  [address] [phone][facility] (retail)       [address]       [place], [place]  [address]       Ph: [phone]       Fax: [phone]   RxID:   [id number] FREESTYLE LANCETS  MISC (LANCETS) two times a day  #60 x 3   Entered by:   Josph Macho RMA   Authorized by:    Danise Edge MD   Signed by:   Josph Macho RMA on 10/29/2010   Method used:   [facility]ronically to        [facility]  [address] [id number] (retail)       [address]       [place], [place]  [address]       Ph: [phone]       Fax: [phone]   RxID:   [id number] TRADJENTA 5 MG TABS (LINAGLIPTIN) 1 tab by mouth two times a day  #60 x 1   Entered and Authorized by:   Danise Edge MD   Signed by:   Danise Edge MD on 10/29/2010   Method used:   [facility]ronically to        [facility]  [address] [phone][facility] (retail)       [address]       [place], [place]  [address]       Ph: [phone]       Fax: [phone]   RxID:   [id number]    Orders Added: 1)  Est. Patient Level IV [37106]

## 2010-11-11 ENCOUNTER — Other Ambulatory Visit: Payer: Self-pay

## 2010-11-11 MED ORDER — METFORMIN HCL 500 MG PO TABS: 500.0000 mg | ORAL_TABLET | Freq: Two times a day (BID) | ORAL | Status: DC

## 2010-11-20 ENCOUNTER — Ambulatory Visit (INDEPENDENT_AMBULATORY_CARE_PROVIDER_SITE_OTHER): Payer: BC Managed Care – PPO | Admitting: Family Medicine

## 2010-11-20 ENCOUNTER — Encounter: Payer: Self-pay | Admitting: Family Medicine

## 2010-11-20 DIAGNOSIS — K279 Peptic ulcer, site unspecified, unspecified as acute or chronic, without hemorrhage or perforation: Secondary | ICD-10-CM

## 2010-11-20 DIAGNOSIS — E782 Mixed hyperlipidemia: Secondary | ICD-10-CM

## 2010-11-20 DIAGNOSIS — J309 Allergic rhinitis, unspecified: Secondary | ICD-10-CM

## 2010-11-20 DIAGNOSIS — E669 Obesity, unspecified: Secondary | ICD-10-CM

## 2010-11-20 DIAGNOSIS — E119 Type 2 diabetes mellitus without complications: Secondary | ICD-10-CM

## 2010-11-20 DIAGNOSIS — S335XXA Sprain of ligaments of lumbar spine, initial encounter: Secondary | ICD-10-CM

## 2010-11-20 DIAGNOSIS — I10 Essential (primary) hypertension: Secondary | ICD-10-CM

## 2010-11-20 DIAGNOSIS — F17200 Nicotine dependence, unspecified, uncomplicated: Secondary | ICD-10-CM

## 2010-11-20 MED ORDER — METFORMIN HCL 500 MG PO TABS: 500.0000 mg | ORAL_TABLET | Freq: Three times a day (TID) | ORAL | Status: DC

## 2010-11-20 NOTE — Assessment & Plan Note (Signed)
May use Loratadine prn if symptoms recur

## 2010-11-20 NOTE — Assessment & Plan Note (Signed)
Patient encouraged to attempt moderate weight loss, avoid simple carbs and trans fats, eat small and frequent meals and increase exercise

## 2010-11-20 NOTE — Progress Notes (Signed)
Phillip Doyle [id number] 1956/03/03 11/20/2010      Progress Note-Follow Up  Subjective  Chief Complaint  Chief Complaint  Patient presents with  . Blood Sugar Problem    follow up    HPI  Patient is in today for followup on his newly diagnosed diabetes and pain. He notes his pain is overall improved he does still have some stiffness and pain in his knees but finds this tolerable. Is not taking any significant amount of pain meds for this at this time. No significant back pain at present. He brings in his sugars his sugars range from 143-88. Most numbers in the a.m. are between 150 and 200. He denies polyuria polydipsia. He notes his fatigue is significantly improved at this time. Otherwise he reports he is doing well. He has cut down to very few cigarettes. He does note he had one this morning but did not have one yesterday. Says a pack lasts him a month at this point. He reports his breathing is better his cough is improved. He is no longer needing his albuterol frequently. He denies fevers, chills, congestion, allergic rhinitis. He denies chest pain, palpitations, shortness of breath, GI or GU complaints.  Past Medical History  Diagnosis Date  . Allergy     rhinitis  . Hypertension   . Obesity   . Ulcer 1982    peptic ulcer disease  . Impaired glucose tolerance   . Diabetes mellitus   . TOBACCO ABUSE 07/04/2010  . PEPTIC ULCER DISEASE 02/15/2007  . NISSEN FUNDOPLICATION, HX OF 02/15/2007  . Mixed hyperlipidemia 07/04/2010  . LUMBAR STRAIN 10/17/2010  . HYPERTENSION 02/15/2007  . EXOGENOUS OBESITY 02/15/2007  . DM 10/24/2010  . ALLERGIC RHINITIS 02/15/2007    Past Surgical History  Procedure Date  . Rotator cuff repair 2000  . Vasectomy 1982  . Fundiplication for hh and reflux 1987  . Negative stress test 2008    Family History  Problem Relation Age of Onset  . Asthma Mother   . Cancer Mother     uterine   . Alcohol abuse Father   . Stroke Maternal Grandmother     possibly    . Hypertension Maternal Grandfather   . Hypertension Brother   . Cancer Brother     prostate    History   Social History  . Marital Status: Married    Spouse Name: N/A    Number of Children: N/A  . Years of Education: N/A   Occupational History  . Not on file.   Social History Main Topics  . Smoking status: Former Smoker -- 1.0 packs/day    Quit date: 06/18/2010  . Smokeless tobacco: Never Used  . Alcohol Use: 21.0 oz/week    35 Cans of beer per week     3-5 beer daily  . Drug Use: No  . Sexually Active: Yes -- Male partner(s)   Other Topics Concern  . Not on file   Social History Narrative  . No narrative on file    Current Outpatient Prescriptions on File Prior to Visit  Medication Sig Dispense Refill  . cyclobenzaprine (FLEXERIL) 10 MG tablet Take 10 mg by mouth 3 (three) times daily as needed. For pain. May cause sedation so reserve for at bedtime use on most days       . Linagliptin (TRADJENTA) 5 MG TABS Take by mouth 2 (two) times daily.        Marland Kitchen lisinopril-hydrochlorothiazide (PRINZIDE,ZESTORETIC) 20-25 MG per tablet Take 1  tablet by mouth daily.        . metoprolol (TOPROL-XL) 100 MG 24 hr tablet Take 100 mg by mouth at bedtime. Take 1 tablet po qhs w/ the 50 mg tab       . metoprolol (TOPROL-XL) 50 MG 24 hr tablet Take 50 mg by mouth daily. Take 1 tablet po qhs with the 100 mg       . nicotine (NICOTROL) 10 MG inhaler Inhale 1 puff into the lungs as needed. 1 cartridge po q 2 hours prn cravings. Max of 10 a day       . NON FORMULARY Freestyle Lancets misc- two times a day       . NON FORMULARY Freestyle test strips- bid        . DISCONTD: metFORMIN (GLUCOPHAGE) 500 MG tablet Take 1 tablet (500 mg total) by mouth 2 (two) times daily with a meal.  60 tablet  2  . albuterol (PROAIR HFA) 108 (90 BASE) MCG/ACT inhaler Inhale 2 puffs into the lungs every 6 (six) hours as needed. For sob/ wheeze/ cough       . Diclofenac Epolamine (FLECTOR) 1.3 % PTCH Place onto  the skin. 1 patch to affected area topically q 12 hours as needed         No Known Allergies  Review of Systems  Review of Systems  Constitutional: Negative for fever and malaise/fatigue.  HENT: Negative for congestion.   Eyes: Negative for discharge.  Respiratory: Negative for shortness of breath.   Cardiovascular: Negative for chest pain, palpitations and leg swelling.  Gastrointestinal: Negative for nausea, abdominal pain and diarrhea.  Genitourinary: Negative for dysuria.  Musculoskeletal: Positive for joint pain. Negative for falls.       [B/l knees stiff and painful intermittently Skin: Negative for rash.  Neurological: Negative for loss of consciousness and headaches.  Endo/Heme/Allergies: Negative for polydipsia.  Psychiatric/Behavioral: Negative for depression and suicidal ideas. The patient is not nervous/anxious and does not have insomnia.     Objective  BP 140/82  Pulse 110  Temp(Src) 98.8 F (37.1 C) (Oral)  Ht 5\' 9"  (1.753 m)  Wt 232 lb 3.2 oz (105.325 kg)  BMI 34.29 kg/m2  SpO2 96%  Physical Exam  Physical Exam  Constitutional: He is oriented to person, place, and time. He appears well-developed and well-nourished. No distress.  HENT:  Nose: Nose normal.  Eyes: Conjunctivae are normal. Right eye exhibits discharge. Left eye exhibits no discharge.  Neck: Normal range of motion.  Cardiovascular: Normal rate, regular rhythm and normal heart sounds.   No murmur heard. Pulmonary/Chest: Effort normal and breath sounds normal.  Abdominal: Soft. Bowel sounds are normal.  Musculoskeletal: Normal range of motion.  Neurological: He is alert and oriented to person, place, and time.  Skin: He is not diaphoretic.  Psychiatric: He has a normal mood and affect. His behavior is normal. Judgment and thought content normal.    Lab Results  Component Value Date   TSH 1.303 10/23/2010   Lab Results  Component Value Date   WBC 9.3 10/23/2010   HGB 17.1* 10/23/2010   HCT  50.4 10/23/2010   MCV 94.6 10/23/2010   PLT 322 10/23/2010   Lab Results  Component Value Date   CREATININE 0.85 10/23/2010   BUN 15 10/23/2010   NA 134* 10/23/2010   K 4.7 10/23/2010   CL 94* 10/23/2010   CO2 26 10/23/2010   Lab Results  Component Value Date   ALT 25 10/23/2010  AST 17 10/23/2010   ALKPHOS 88 10/23/2010   BILITOT 0.7 10/23/2010   Lab Results  Component Value Date   CHOL 237* 10/23/2010   Lab Results  Component Value Date   HDL 34* 10/23/2010   Lab Results  Component Value Date   LDLCALC 147* 10/23/2010   Lab Results  Component Value Date   TRIG 281* 10/23/2010   Lab Results  Component Value Date   CHOLHDL 7.0 Ratio 10/23/2010     Assessment & Plan  TOBACCO ABUSE Patient repots his use has cut back dramatically. He admits to sneaking one this am but did not have any yesterday and at this point he says a pack will last him roughly a month. He is using his Nicotrol inhaler a couple to 3 x a day which has been helpful and he is fully committed to quitting altogether. He is offered further encouragement today  PEPTIC ULCER DISEASE Patient denies any concerning symptoms at today's visit. Has not been taking frequent OTC meds   MIXED HYPERLIPIDEMIA History noted, repeat FLP and add a fish oil cap daily, avoid trans fats and continue to monitor  LUMBAR STRAIN Improved somewhat only using meds infrequently at this time, he is encouraged to continue to attempt increased activity, dietary changes and weight loss  HYPERTENSION Adequately controlled on current meds, no changes  EXOGENOUS OBESITY Patient encouraged to attempt moderate weight loss, avoid simple carbs and trans fats, eat small and frequent meals and increase exercise  DM Patient brought in his sugar log for the past 2 weeks. His highest number is 265 and lowest was 143. Generally his numbers have ranged from 150 to 200 recently. He is tolerating Tradjenta and Metformin, will increase Metformin to tid and patient will  call if he has any difficulties  ALLERGIC RHINITIS May use Loratadine prn if symptoms recur

## 2010-11-20 NOTE — Assessment & Plan Note (Signed)
Patient repots his use has cut back dramatically. He admits to sneaking one this am but did not have any yesterday and at this point he says a pack will last him roughly a month. He is using his Nicotrol inhaler a couple to 3 x a day which has been helpful and he is fully committed to quitting altogether. He is offered further encouragement today

## 2010-11-20 NOTE — Assessment & Plan Note (Signed)
History noted, repeat FLP and add a fish oil cap daily, avoid trans fats and continue to monitor

## 2010-11-20 NOTE — Assessment & Plan Note (Signed)
Adequately controlled on current meds, no changes. 

## 2010-11-20 NOTE — Assessment & Plan Note (Signed)
Patient denies any concerning symptoms at today's visit. Has not been taking frequent OTC meds

## 2010-11-20 NOTE — Assessment & Plan Note (Signed)
Improved somewhat only using meds infrequently at this time, he is encouraged to continue to attempt increased activity, dietary changes and weight loss

## 2010-11-20 NOTE — Assessment & Plan Note (Signed)
Patient brought in his sugar log for the past 2 weeks. His highest number is 265 and lowest was 143. Generally his numbers have ranged from 150 to 200 recently. He is tolerating Tradjenta and Metformin, will increase Metformin to tid and patient will call if he has any difficulties

## 2010-11-20 NOTE — Patient Instructions (Signed)
Diabetes, Type 2 Diabetes is a lasting (chronic) disease. In type 2 diabetes, the pancreas does not make enough insulin (a hormone), and the body does not respond normally to the insulin that is made. This type of diabetes was also previously called adult onset diabetes. About 90% of all those who have diabetes have type 2. It usually occurs after the age of 35 but can occur at any age. CAUSES Unlike type 1 diabetes, which happens because insulin is no longer being made, type 2 diabetes happens because the body is making less insulin and has trouble using the insulin properly. SYMPTOMS  Drinking more than usual.   Urinating more than usual.   Blurred vision.   Dry, itchy skin.   Frequent infection like yeast infections in women.   More tired than usual (fatigue).  TREATMENT  Healthy eating.   Exercise.   Medication, if needed.   Monitoring blood glucose (sugar).   Seeing your caregiver regularly.  HOME CARE INSTRUCTIONS  Check your blood glucose (sugar) at least once daily. More frequent monitoring may be necessary, depending on your medications and on how well your diabetes is controlled. Your caregiver will advise you.   Take your medicine as directed by your caregiver.   Do not smoke.   Make wise food choices. Ask your caregiver for information. Weight loss can improve your diabetes.   Learn about low blood glucose (hypoglycemia) and how to treat it.   Get your eyes checked regularly.   Have a yearly physical exam. Have your blood pressure checked. Get your blood and urine tested.   Wear a pendant or bracelet saying that you have diabetes.   Check your feet every night for sores. Let your caregiver know if you have sores that are not healing.  SEEK MEDICAL CARE IF:  You are having problems keeping your blood glucose at target range.   You feel you might be having problems with your medicines.   You have symptoms of an illness that is not improving after 24  hours.   You have a sore or wound that is not healing.   You notice a change in vision or a new problem with your vision.   You develop a fever of more than 104.  Document Released: 08/04/2005 Document Re-Released: 08/26/2009 [facility] Patient Information 2011 [place], [place].  Start a fish oil cap daily, can freeze caps prior to taking them

## 2010-12-31 NOTE — Assessment & Plan Note (Signed)
[facility] OFFICE NOTE   Phillip Doyle, Phillip Doyle                           MRN:          [id number]  DATE:01/19/2007                            DOB:          September 24, 1955    A 55 year old gentleman seen today to establish with our practice.  He  has history of hypertension since 2000.  Earlier this year had right  rotator cuff surgery.  Apparently, he had an abnormal preoperative EKG,  and had a Cardiolite stress test that was negative.  He had a vasectomy  in 1982.  He has done well, still having physical therapy from his right  rotator cuff surgery.  In 1988, he underwent a Nissen fundoplication.  He has a remote history of peptic ulcer disease in 1982.  He has  seasonal allergic rhinitis.   SOCIAL HISTORY:  He is married.  Wife recently delivered a child 5 weeks  ago.  He retired from [employer] in 1996 after 20 years of service.   FAMILY HISTORY:  Father died at 40 of alcohol related issues.  Mother  died at 59 of uterine cancer, also history of asthma.  He has 5 brothers  and 7 sisters, 1 brother deceased of prostate cancer.   EXAMINATION:  Reveals an overweight male, in no acute distress.  Blood pressure is 140/80.  Fundi, ears, nose, and throat clear.  NECK:  No bruits or adenopathy.  CHEST:  Was clear.  CARDIOVASCULAR:  Normal heart sounds.  No murmurs.  ABDOMEN:  Obese, soft, and non-tender.  Surgical scars noted.  EXTERNAL GENITALIA:  Normal.  No hernias.  EXTREMITIES:  Negative.  Full peripheral pulses.   IMPRESSION:  1. Hypertension.  2. Exogenous obesity.  3. Status post right rotator cuff surgery.  4. Seasonal allergic rhinitis.   DISPOSITION:  1. Medical regimen unchanged.  2. Records will be obtained.  3. He will be rechecked in 3 months.  We will consider for lab      including PSA at that time.     Gordy Savers, MD  Electronically Signed    PFK/MedQ  DD: 01/19/2007  DT:  01/19/2007  Job #: [id number]

## 2011-01-03 NOTE — Op Note (Signed)
Phillip Doyle, Phillip Doyle NO.:  [id number]   MEDICAL RECORD NO.:  [id number]          PATIENT TYPE:  AMB   LOCATION:  SDS                          FACILITY:  [facility]   PHYSICIAN:  Almedia Balls. Ranell Patrick, M.D. DATE OF BIRTH:  Apr 27, 1956   DATE OF PROCEDURE:  11/16/2006  DATE OF DISCHARGE:                               OPERATIVE REPORT   PREOPERATIVE DIAGNOSIS:  Right shoulder pain secondary to rotator cuff  tear and acromioclavicular joint arthritis.   POSTOPERATIVE DIAGNOSIS:  Right shoulder pain secondary to rotator cuff  tear acromioclavicular joint arthritis.   PROCEDURES PERFORMED:  1. Right shoulder arthroscopy with arthroscopic subacromial      decompression.  2. Mini open rotator cuff repair.  3. Open disk clavicle excision.   SURGEON:  Almedia Balls. Ranell Patrick, M.D.   ASSISTANT:  Donnie Coffin. Dixon, PA-C.   ANESTHESIA:  General anesthesia, blood thinner, scalene block anesthesia  was used.   ESTIMATED BLOOD LOSS:  Minimal.   FLUID REPLACED:  1200 cc Crystalloid.   COUNTS:  Instrument count was correct.   COMPLICATIONS:  None.   Antibiotics given.   INDICATIONS:  Patient is a 55 year old male with a history of worsening  right shoulder pain secondary to MRI proven rotator cuff tear as well as  a chronic impingement and AC arthrosis.  Patient has failed conservative  management at this point.  Presents with persistent pain in his shoulder  and functional loss desiring operative treatment to restore function,  eliminate pain.  Informed consent was obtained.   DESCRIPTION OF PROCEDURE:  After an adequate level of anesthesia was  achieved the patient was positioned in modified beach chair position.  All neurovascular structures tied appropriately.  The right shoulder was  examined under anesthesia.  Full passive range of motion in the shoulder  was noted with no undue instability or tightness.  Following this we  went ahead and sterilely prepped and draped the  right shoulder in the  usual manner.  We entered the shoulder at the scapula using standard  endoscopic portal.  The anterolateral and posterior portals were created  in a similar fashion with infiltration of the skin 0.25% Marcaine with  epinephrine followed by incision with the #11 blade scalpel.  Introduction with the cannula into the joint using bone obturator,  diagnostic arthroscopy revealed normal superior labrum biceps anchor.  Glenohumeral articular cartilage in good shape.  Anterior and inferior  labrum normal.  Subscapularis and glenohumeral ligaments normal.  Posterior labrum intact.  Posterior rotator cuff intact.  Anterior cuff  intact.  At this point, the scope was placed in the subacromial space.  Significant synovitis was present and bursitis.  We performed a thorough  bursectomy followed by an aggressive acromioplasty creating a type 1  acromial shape and removing a large anterior spur.  We did release the  ligament.  We did not see compression of the subacromial outlet.  There  was a 1.5-2 cm rotator cuff tear noted on the bursal surface.  This  appeared to near full thickness with some loose flaps of  tissue.  We  debrided these using a motorized shaver and suspect that this involved  the majority of the anchor for the rotator cuff.  At this point we went  ahead and concluded the arthroscopy and made a saber incision overlying  the [facility] joint dissecting and carried sharply down through subcutaneous  tissues deltotrapezius fascia identified and split in line with the  fibers.  Subperiosteal dissection of distal clavicle performed followed  by excision of distal 7 mm of distal clavicle using an oscillating saw.  The patient's arthritic distal clavicle was removed.  We went ahead and  applied bone wax to the clavicle, thoroughly irrigating interval  followed by closure of the deltotrapezius fascia with interrupted 0  Vicryl suture followed by 2-0 Vicryl subcutaneous closure  and 4-0  Monocryl to the skin.  We then made a mini open incision in the  anterolateral head of the deltoid.  Dissection carried down through the  skin and subcutaneous tissues, the deltoid split in line with facet  raphe in the fascia down through the muscle into the rotator cuff.  We  went ahead and used a _______ retractor, identified the rotator cuff  tear easily, debrided all non viable rotator cuff tissue, freshened up  the rotator cuff footprint then graphed this with a nontraumatic rotator  cuff grasper and then used the Cobb elevator on both sides of the cuff,  both the joint side and the bursal side to mobilize the cuff so it had  nice balance.  Placed 2 modified Mason -Allen sutures with #2 FiberWire  suture and then a single 5.5 Arthrex bi-cortical screw anchor double  loaded with #2 FiberWire at the junction of the articular cartilage and  the rotator cuff footprint greater tuberosity.  Once we placed the  double loaded sutures up through in a mattress fashion through the  tendon we then took the 4 sutures that we had and took those through 3  drill holes out to the lateral humeral, lateral greater tuberosity.  We  made a nice, slow repair with the 2 horizontal mattress #2 FiberWires  that were attached to the anchor, applying the medial portion of the  footprint directly down to bone and then utilizing the free edge sutures  and bringing those out through drill holes out laterally giving [place] nice  repair lateral side of the footprint.  We did have to tuck under a dog  tail in the back with a 0 Vicryl suture which did give [place] a low profile  repair, took the shoulder through a full range of motion, no impingement  was noted.  We then irrigated thoroughly, closed the deltoid fascia and  deltoid to itself at that raphe, between the anterolateral heads using 0  Vicryl suture in a figure-of-eight fashion and simple fashion and then we went in and closed the subcu with 2-0 Vicryl  subcutaneous and 4-0  Monocryl on the skin and Steri-Strips were applied followed by a sterile  dressing.  The patient tolerated the surgery well.      Almedia Balls. Ranell Patrick, M.D.  Electronically Signed     SRN/MEDQ  D:  11/16/2006  T:  11/16/2006  Job:  [id number]

## 2011-01-03 NOTE — Procedures (Signed)
NAME:  Phillip Doyle, Phillip Doyle NO.:  [id number]   MEDICAL RECORD NO.:  [id number]          PATIENT TYPE:  OUT   LOCATION:  SLEEP CENTER                 FACILITY:  [facility]   PHYSICIAN:  Clinton D. Maple Hudson, MD, FCCP, FACPDATE OF BIRTH:  1956-11-[birth year]   DATE OF STUDY:  08/05/2006                            NOCTURNAL POLYSOMNOGRAM   INDICATION FOR STUDY:  Hypersomnia with sleep apnea.   EPWORTH SLEEPINESS SCORE:  10/24.   BMI:  36.9.   WEIGHT:  250 pounds.   HOME MEDICATION:  Metoprolol, lisinopril, HCTZ.   MEDICATIONS:   SLEEP ARCHITECTURE:  Total sleep time 360 minutes with sleep efficiency  78%.  Stage I was 16%, stage II 72%, stages III and IV were absent, REM  was 12% of total sleep time.  Sleep latency 18 minutes, REM latency 249  minutes, awake after sleep onset 83 minutes with very frequent  awakenings throughout the night.  Arousal index 25.5.  No bedtime  medication was taken.   RESPIRATORY DATA:  Apnea/hypopnea index (AHI, RDI) 16 obstructive events  per hour, indicating moderate obstructive sleep apnea/hypopnea syndrome.  There were 18 obstructive apneas and 78 hypopneas.  The events were not  positional.  REM AHI 50.5 per hour.  He did not have enough events and  sustained sleep in the first hours of the night to meet criteria for a  split study CPAP titration on the study night.   OXYGEN DATA:  Moderate to loud snoring with oxygen desaturation to a  nadir of 81%.  Mean oxygen saturation through the study was 91% on room  air.   CARDIAC DATA:  Normal sinus rhythm.   MOVEMENT-PARASOMNIA:  Occasional limb jerk with insignificant effect on  sleep.  No bathroom trips.   IMPRESSIONS-RECOMMENDATIONS:  1. Frequent awakenings and sleep fragmentation which patient indicated      same as usual.  Only some of these awakenings could be attributed      to respiratory events.  2. Moderate obstructive sleep apnea/hypopnea syndrome, apnea/hypopnea      index 16 per hour  with nonpositional events, moderate to loud      snoring at oxygen desaturation to a nadir of 81%.  3. He did not qualify for split protocol continuous positive airway      pressure titration on this study night, but would be eligible to      return for continuous positive airway pressure titration.  If      surgery is anticipated before he      can be formally titrated, suggest empiric continuous positive      airway pressure at 10 CWP for the immediate surgical time period.      Clinton D. Maple Hudson, MD, [facility], FACP  Diplomate, Biomedical engineer of Sleep Medicine  Electronically Signed     CDY/MEDQ  D:  08/16/2006 09:47:07  T:  08/16/2006 12:55:37  Job:  [id number]

## 2011-01-13 ENCOUNTER — Other Ambulatory Visit: Payer: Self-pay | Admitting: Family Medicine

## 2011-01-20 ENCOUNTER — Ambulatory Visit (INDEPENDENT_AMBULATORY_CARE_PROVIDER_SITE_OTHER): Payer: BC Managed Care – PPO | Admitting: Family Medicine

## 2011-01-20 ENCOUNTER — Encounter: Payer: Self-pay | Admitting: Family Medicine

## 2011-01-20 DIAGNOSIS — I10 Essential (primary) hypertension: Secondary | ICD-10-CM

## 2011-01-20 DIAGNOSIS — F17200 Nicotine dependence, unspecified, uncomplicated: Secondary | ICD-10-CM

## 2011-01-20 DIAGNOSIS — S335XXA Sprain of ligaments of lumbar spine, initial encounter: Secondary | ICD-10-CM

## 2011-01-20 DIAGNOSIS — E782 Mixed hyperlipidemia: Secondary | ICD-10-CM

## 2011-01-20 DIAGNOSIS — Z559 Problems related to education and literacy, unspecified: Secondary | ICD-10-CM

## 2011-01-20 DIAGNOSIS — E119 Type 2 diabetes mellitus without complications: Secondary | ICD-10-CM

## 2011-01-20 MED ORDER — METFORMIN HCL 1000 MG PO TABS: 1000.0000 mg | ORAL_TABLET | Freq: Two times a day (BID) | ORAL | Status: DC

## 2011-01-20 MED ORDER — NICOTINE 10 MG IN INHA: 1.0000 | RESPIRATORY_TRACT | Status: DC | PRN

## 2011-01-20 MED ORDER — METOPROLOL SUCCINATE ER 200 MG PO TB24: 200.0000 mg | ORAL_TABLET | Freq: Every day | ORAL | Status: DC

## 2011-01-20 MED ORDER — LISINOPRIL-HYDROCHLOROTHIAZIDE 20-25 MG PO TABS: 1.0000 | ORAL_TABLET | Freq: Every day | ORAL | Status: DC

## 2011-01-20 NOTE — Assessment & Plan Note (Signed)
At present his pain has resolved completely. Continue with increased activity and decreased po intake.

## 2011-01-20 NOTE — Assessment & Plan Note (Signed)
Patient has started to smoke again slightly. He notes smoking roughly a pack per week. He does acknowledge she does want a full liquid again and is asking for refill on the Nicotrol inhalers because he does believe that helped. He is counseled for at least 3 minutes regarding the need for cessation and the risks for increased health troubles if he continues to smoke.

## 2011-01-20 NOTE — Assessment & Plan Note (Signed)
Will repeat FLP next week and he is to avoid trans fats, continue fish oil supplements

## 2011-01-20 NOTE — Assessment & Plan Note (Signed)
Still mildly elevated with some persistent tachycardia, increase Toprol XL to 200mg  dialy and reassess.

## 2011-01-20 NOTE — Patient Instructions (Signed)
Diabetes and Exercise Regular exercise is important and can help:   Control blood glucose (sugar).   Decrease blood pressure.   Control blood lipids (cholesterol and triglycerides).   Improve overall health.  BENEFITS FROM EXERCISE:  Improved fitness.   Improved flexibility.   Improved endurance.   Increased bone density.   Weight control.   Increased muscle strength.   Decreased body fat.   Improvement of the body's use of a hormone called insulin.   Increased insulin sensitivity.   Reduction of insulin needs.   Helps you feel better.   Reduces stress and tension.  People with diabetes who add exercise to their lifestyle gain additional benefits.   Weight loss.   Reduces appetite.   Improves body's use of blood glucose (sugar).   Decreases risk factors for heart disease:   Lowering of cholesterol and triglycerides.   Raising the level of good cholesterol (high-density lipoproteins [HDL]).   Lowering blood sugar.   Decreases blood pressure.  TYPE 1 DIABETES AND EXERCISE  Exercise will usually lower your blood glucose.   If blood glucose is greater than 240 mg/dl, check urine ketones. If ketones are present, do not exercise.   Location of the insulin injection sites may need to be adjusted with exercise. Avoid injecting insulin into areas of the body that will be exercised. For example, avoid injecting insulin into:   The arms when playing tennis.   The legs when jogging. For more information, discuss this with your caregiver.   Keep a record of:   Food intake.   Type and amount of exercise.   Expected peak times of insulin action.   Blood glucose (sugar) levels.  Do this before, during and after exercise. Review your records with your caregiver(s). This will help you to develop guidelines for adjusting food intake and/or insulin amounts.  TYPE 2 DIABETES AND EXERCISE  Regular physical activity can help control blood glucose.   Exercise is  important because it may:   Increase the body's sensitivity to insulin.   Improve blood glucose control.   Exercise reduces the risk of heart disease. It decreases serum cholesterol and triglycerides. It also lowers blood pressure.   Those who take insulin or oral hypoglycemic agents should watch for signs of hypoglycemia. These signs include dizziness, shaking, sweating, chills and confusion.   Body water is lost during exercise. It must be replaced. This will help to avoid loss of body fluids (dehydration) and/or heat stroke.  Be sure to talk to your caregiver before starting an exercise program to make sure it is safe for you. Remember, any activity is better than none.  Document Released: 10/25/2003 Document Re-Released: 06/01/2009 [employer] Patient Information 2011 [employer]. 

## 2011-01-20 NOTE — Assessment & Plan Note (Signed)
As reviewed with patient. His sugars were ranging from 118-163 and he seen no numbers below 100. It is slightly too soon to check his hemoglobin A 1C and he'll return next week to have that done but in the meantime we'll increase his metformin  to1000 mg by mouth twice a day from 500 3 times a day to help further control his numbers. He is to report to the cords and to maintain increased activity level.

## 2011-01-20 NOTE — Progress Notes (Signed)
IZAIYAH KLEINMAN [id number] [birth year] 01/20/2011      Progress Note-Follow Up  Subjective  Chief Complaint  Chief Complaint  Patient presents with  . Follow-up    2 month follow up w/ labs    HPI  Patient is a 55 year old Caucasian male in today for followup of multiple medical problems. He reports his back and hip pain have resolved. He is exercis and eating better. He is eating smaller meals with less simple carbs and less fatty foods. He says overall he feels greatly improved. His fatigue is improving. Unfortunately he has begun to smoke again. Smokes about a pack per week but is willing to try quitting again. He denies any recent illness, fevers, chills, congestion or allergies. He denies any chest pain, palpitations, shortness of breath, GI or GU complaints at this time. His blood sugars have been ranging from 150 to 180 in the morning and was 163 this morning. In the afternoon they're as low as 118 and as high as 180.  Past Medical History  Diagnosis Date  . Allergy     rhinitis  . Hypertension   . Obesity   . Ulcer 1982    peptic ulcer disease  . Impaired glucose tolerance   . Diabetes mellitus   . TOBACCO ABUSE 07/04/2010  . PEPTIC ULCER DISEASE 02/15/2007  . NISSEN FUNDOPLICATION, HX OF 02/15/2007  . Mixed hyperlipidemia 07/04/2010  . LUMBAR STRAIN 10/17/2010  . HYPERTENSION 02/15/2007  . EXOGENOUS OBESITY 02/15/2007  . DM 10/24/2010  . ALLERGIC RHINITIS 02/15/2007    Past Surgical History  Procedure Date  . Rotator cuff repair 2000  . Vasectomy 1982  . Fundiplication for hh and reflux 1987  . Negative stress test 2008    Family History  Problem Relation Age of Onset  . Asthma Mother   . Cancer Mother     uterine   . Alcohol abuse Father   . Stroke Maternal Grandmother     possibly  . Hypertension Maternal Grandfather   . Hypertension Brother   . Cancer Brother     prostate    History   Social History  . Marital Status: Married    Spouse Name: N/A    Number  of Children: N/A  . Years of Education: N/A   Occupational History  . Not on file.   Social History Main Topics  . Smoking status: Current Some Day Smoker -- 1.0 packs/day    Types: Cigarettes  . Smokeless tobacco: Never Used   Comment: quit 06/18/10- started back   . Alcohol Use: 21.0 oz/week    35 Cans of beer per week     3-5 beer daily  . Drug Use: No  . Sexually Active: Yes -- Male partner(s)   Other Topics Concern  . Not on file   Social History Narrative  . No narrative on file    Current Outpatient Prescriptions on File Prior to Visit  Medication Sig Dispense Refill  . albuterol (PROAIR HFA) 108 (90 BASE) MCG/ACT inhaler Inhale 2 puffs into the lungs every 6 (six) hours as needed. For sob/ wheeze/ cough       . cyclobenzaprine (FLEXERIL) 10 MG tablet Take 10 mg by mouth 3 (three) times daily as needed. For pain. May cause sedation so reserve for at bedtime use on most days       . Diclofenac Epolamine (FLECTOR) 1.3 % PTCH Place onto the skin. 1 patch to affected area topically q 12 hours as needed       .  FREESTYLE LITE test strip       . Lancets (FREESTYLE) lancets       . NON FORMULARY Freestyle Lancets misc- two times a day       . NON FORMULARY Freestyle test strips- bid        . TRADJENTA 5 MG TABS tablet TAKE 1 TABLET BY MOUTH TWICE A DAY  60 tablet  1  . DISCONTD: lisinopril-hydrochlorothiazide (PRINZIDE,ZESTORETIC) 20-25 MG per tablet Take 1 tablet by mouth daily.        Marland Kitchen DISCONTD: metFORMIN (GLUCOPHAGE) 500 MG tablet Take 1 tablet (500 mg total) by mouth 3 (three) times daily with meals.  90 tablet  2  . DISCONTD: metoprolol (TOPROL-XL) 100 MG 24 hr tablet Take 100 mg by mouth at bedtime. Take 1 tablet po qhs w/ the 50 mg tab       . DISCONTD: metoprolol (TOPROL-XL) 50 MG 24 hr tablet Take 50 mg by mouth daily. Take 1 tablet po qhs with the 100 mg       . DISCONTD: nicotine (NICOTROL) 10 MG inhaler Inhale 1 puff into the lungs as needed. 1 cartridge po q 2  hours prn cravings. Max of 10 a day         No Known Allergies  Review of Systems  Review of Systems  Constitutional: Negative for fever and malaise/fatigue.  HENT: Negative for congestion.   Eyes: Negative for discharge.  Respiratory: Negative for shortness of breath.   Cardiovascular: Negative for chest pain, palpitations and leg swelling.  Gastrointestinal: Negative for nausea, abdominal pain and diarrhea.  Genitourinary: Negative for dysuria.  Musculoskeletal: Negative for falls.  Skin: Negative for rash.  Neurological: Negative for loss of consciousness and headaches.  Endo/Heme/Allergies: Negative for polydipsia.  Psychiatric/Behavioral: Negative for depression and suicidal ideas. The patient is not nervous/anxious and does not have insomnia.     Objective  BP 145/87  Pulse 105  Temp(Src) 98.2 F (36.8 C) (Oral)  Ht 5\' 9"  (1.753 m)  Wt 224 lb 12.8 oz (101.969 kg)  BMI 33.20 kg/m2  SpO2 95%  Physical Exam  Physical Exam  Constitutional: He is oriented to person, place, and time and well-developed, well-nourished, and in no distress. No distress.  HENT:  Head: Normocephalic and atraumatic.  Eyes: Conjunctivae are normal.  Neck: Neck supple. No thyromegaly present.  Cardiovascular: Normal rate, regular rhythm and normal heart sounds.   No murmur heard. Pulmonary/Chest: Effort normal and breath sounds normal. No respiratory distress.  Abdominal: He exhibits no distension and no mass. There is no tenderness.  Musculoskeletal: He exhibits no edema.  Neurological: He is alert and oriented to person, place, and time.  Skin: Skin is warm.  Psychiatric: Memory, affect and judgment normal.    Lab Results  Component Value Date   TSH 1.303 10/23/2010   Lab Results  Component Value Date   WBC 9.3 10/23/2010   HGB 17.1* 10/23/2010   HCT 50.4 10/23/2010   MCV 94.6 10/23/2010   PLT 322 10/23/2010   Lab Results  Component Value Date   CREATININE 0.85 10/23/2010   BUN 15  10/23/2010   NA 134* 10/23/2010   K 4.7 10/23/2010   CL 94* 10/23/2010   CO2 26 10/23/2010   Lab Results  Component Value Date   ALT 25 10/23/2010   AST 17 10/23/2010   ALKPHOS 88 10/23/2010   BILITOT 0.7 10/23/2010   Lab Results  Component Value Date   CHOL 237* 10/23/2010  Lab Results  Component Value Date   HDL 34* 10/23/2010   Lab Results  Component Value Date   LDLCALC 147* 10/23/2010   Lab Results  Component Value Date   TRIG 281* 10/23/2010   Lab Results  Component Value Date   CHOLHDL 7.0 Ratio 10/23/2010     Assessment & Plan  TOBACCO ABUSE Patient has started to smoke again slightly. He notes smoking roughly a pack per week. He does acknowledge she does want a full liquid again and is asking for refill on the Nicotrol inhalers because he does believe that helped. He is counseled for at least 3 minutes regarding the need for cessation and the risks for increased health troubles if he continues to smoke.  LUMBAR STRAIN At present his pain has resolved completely. Continue with increased activity and decreased po intake.  HYPERTENSION Still mildly elevated with some persistent tachycardia, increase Toprol XL to 200mg  dialy and reassess.  DM As reviewed with patient. His sugars were ranging from 118-163 and he seen no numbers below 100. It is slightly too soon to check his hemoglobin A 1C and he'll return next week to have that done but in the meantime we'll increase his metformin  to1000 mg by mouth twice a day from 500 3 times a day to help further control his numbers. He is to report to the cords and to maintain increased activity level.  MIXED HYPERLIPIDEMIA Will repeat FLP next week and he is to avoid trans fats, continue fish oil supplements

## 2011-01-28 ENCOUNTER — Other Ambulatory Visit (INDEPENDENT_AMBULATORY_CARE_PROVIDER_SITE_OTHER): Payer: BC Managed Care – PPO

## 2011-01-28 DIAGNOSIS — I10 Essential (primary) hypertension: Secondary | ICD-10-CM

## 2011-01-28 DIAGNOSIS — E119 Type 2 diabetes mellitus without complications: Secondary | ICD-10-CM

## 2011-01-28 LAB — HEPATIC FUNCTION PANEL
AST: 18 U/L (ref 0–37)
Albumin: 4.6 g/dL (ref 3.5–5.2)
Total Bilirubin: 0.7 mg/dL (ref 0.3–1.2)

## 2011-01-28 LAB — RENAL FUNCTION PANEL
CO2: 25 mEq/L (ref 19–32)
Creatinine, Ser: 0.7 mg/dL (ref 0.4–1.5)
GFR: 127.17 mL/min (ref >60.00)
Glucose, Bld: 133 mg/dL — ABNORMAL HIGH (ref 70–99)
Phosphorus: 3.5 mg/dL (ref 2.3–4.6)
Sodium: 133 mEq/L — ABNORMAL LOW (ref 135–145)

## 2011-01-28 LAB — LIPID PANEL
Cholesterol: 163 mg/dL (ref 0–200)
HDL: 41.4 mg/dL (ref >39.00)
LDL Cholesterol: 101 mg/dL — ABNORMAL HIGH (ref 0–99)
Triglycerides: 102 mg/dL (ref 0.0–149.0)
VLDL: 20.4 mg/dL (ref 0.0–40.0)

## 2011-01-28 LAB — CBC WITH DIFFERENTIAL/PLATELET
Basophils Absolute: 0.1 10*3/uL (ref 0.0–0.1)
Eosinophils Absolute: 0.2 10*3/uL (ref 0.0–0.7)
HCT: 46.8 % (ref 39.0–52.0)
Lymphs Abs: 2.3 10*3/uL (ref 0.7–4.0)
MCHC: 34.1 g/dL (ref 30.0–36.0)
Monocytes Relative: 8.2 % (ref 3.0–12.0)
Platelets: 337 10*3/uL (ref 150.0–400.0)
RDW: 13.1 % (ref 11.5–14.6)

## 2011-01-30 ENCOUNTER — Encounter: Payer: Self-pay | Admitting: *Deleted

## 2011-03-23 ENCOUNTER — Other Ambulatory Visit: Payer: Self-pay | Admitting: Family Medicine

## 2011-04-01 ENCOUNTER — Other Ambulatory Visit: Payer: Self-pay | Admitting: Family Medicine

## 2011-04-03 IMAGING — CR DG LUMBAR SPINE 2-3V
3 series · 3 of 3 positions shown · non-contrast
Comparison: None.

CLINICAL DATA: Back pain.

LUMBAR SPINE - 2-3 VIEW

[t l-spine a.p.]
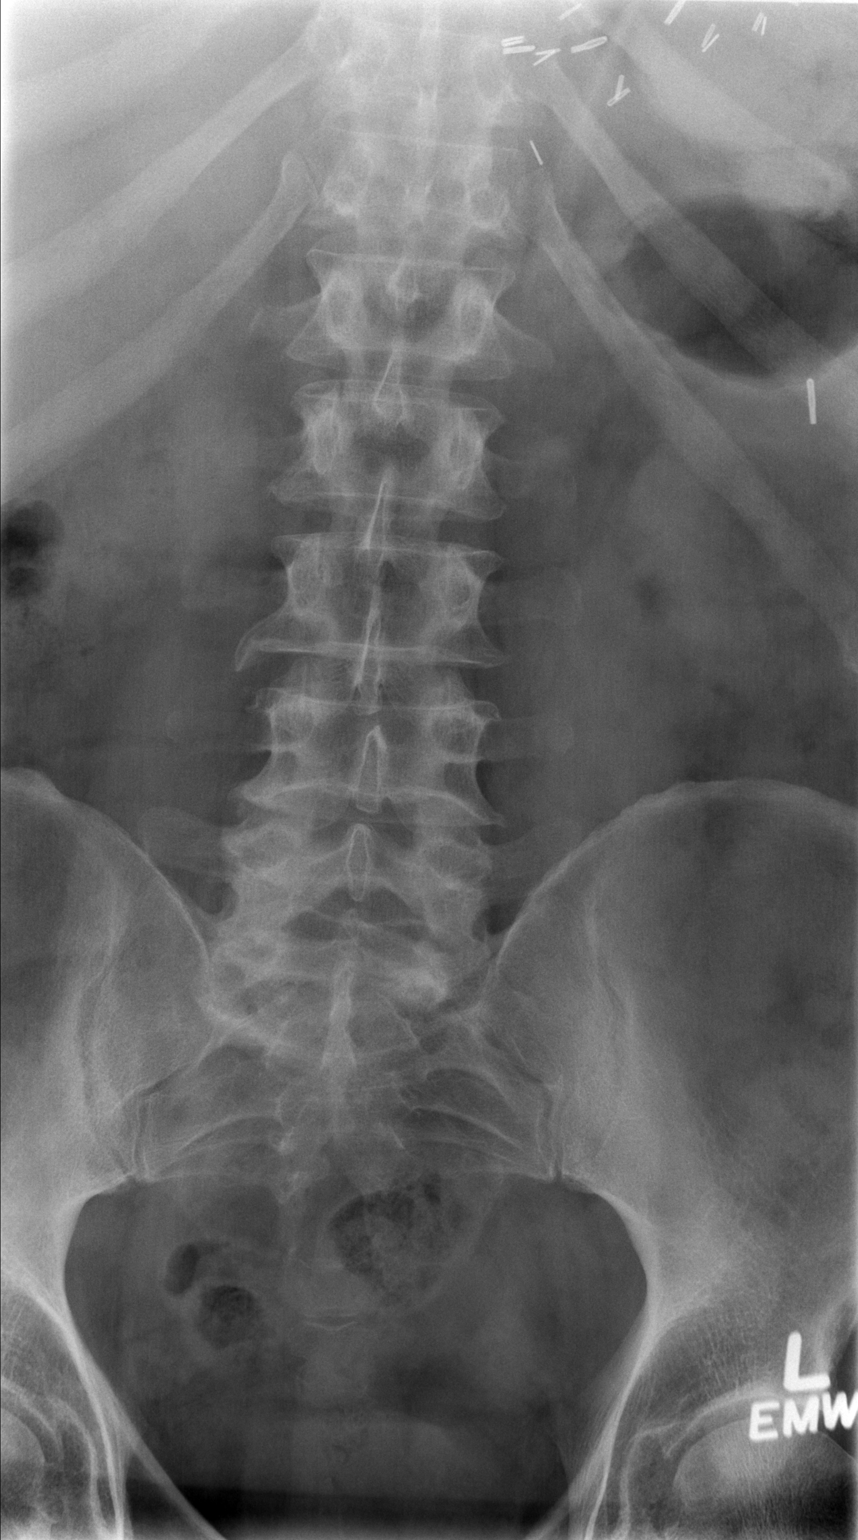

[t l-spine lat]
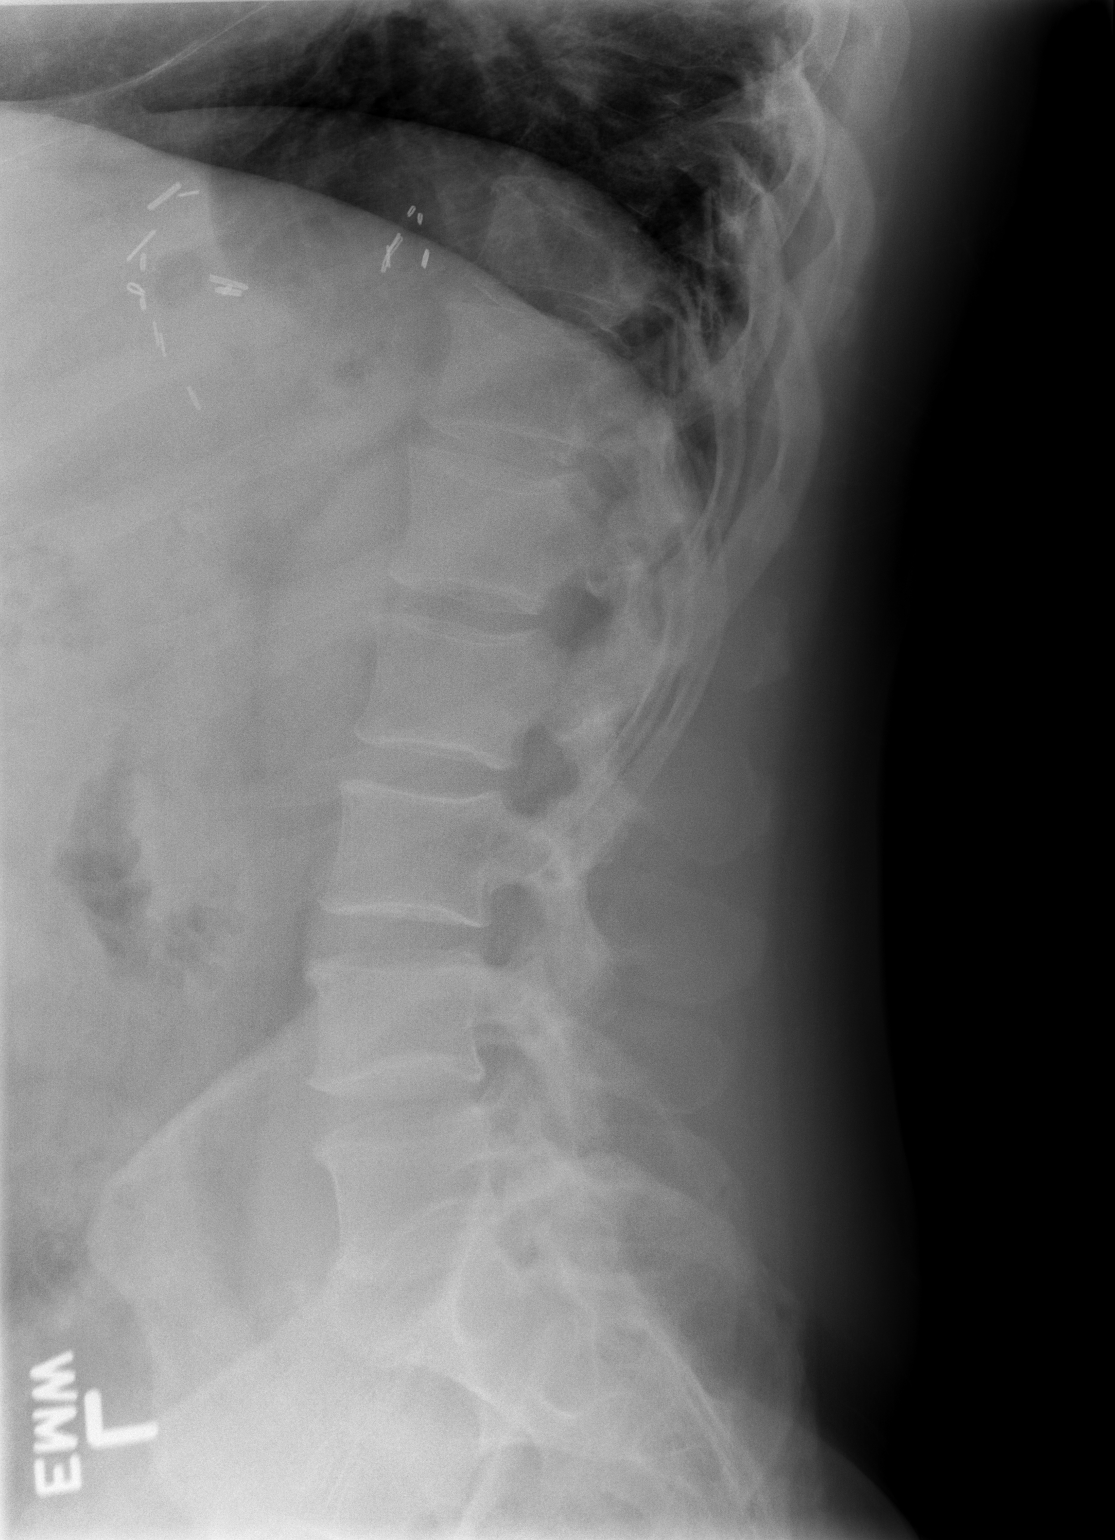

[t l-spine l5-s1 spot]
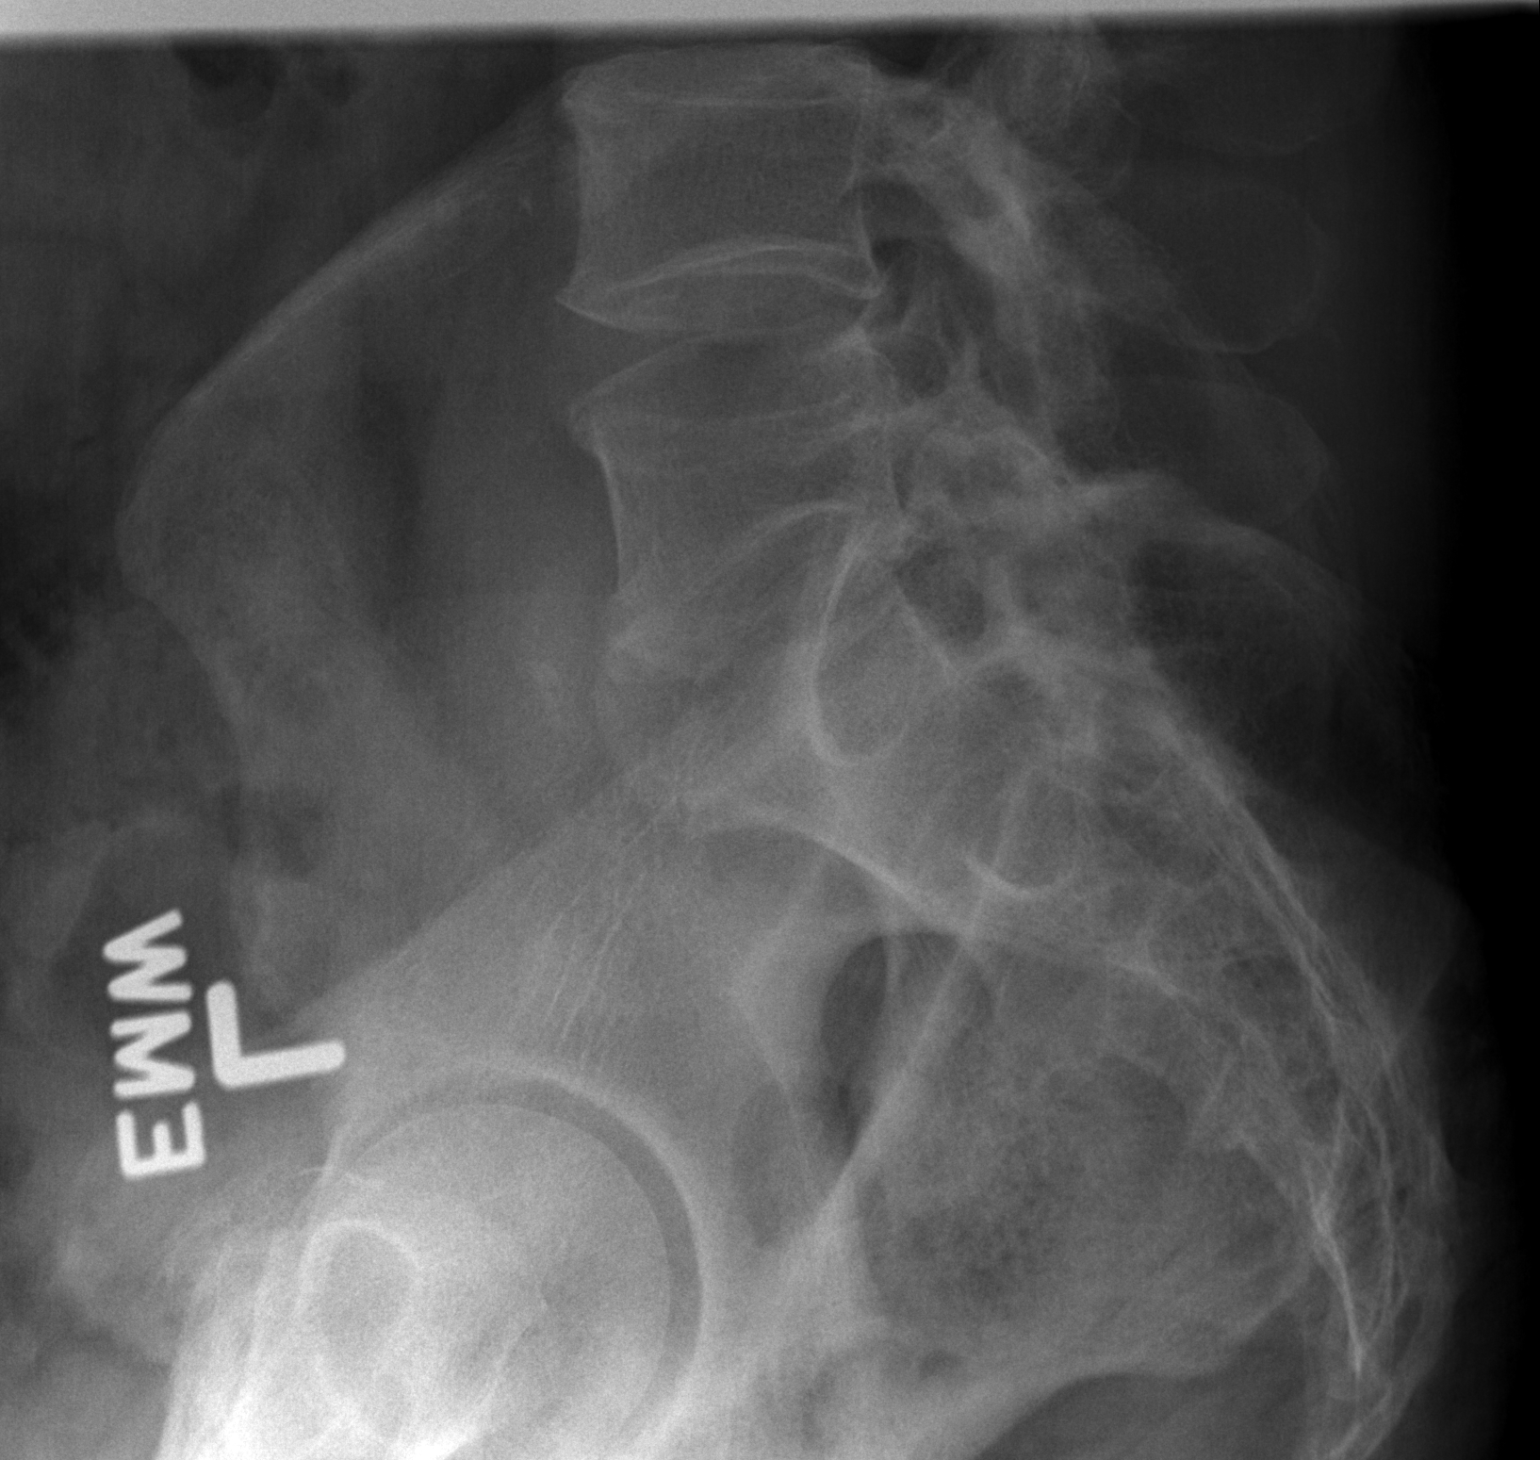

[3 of 3 positions shown; findings below may reference images not displayed]

FINDINGS: Vertebral body height and alignment are maintained.  Mild
anterior endplate spurring is noted at  L3-4 and L4-5.  Facet
arthropathy is also noted L4-5 and L5-S1.  Surgical clips in the
left upper quadrant noted.
IMPRESSION: 1.  No acute finding.
2.  Mild appearing lower lumbar spondylosis.

## 2011-04-16 ENCOUNTER — Other Ambulatory Visit: Payer: Self-pay | Admitting: Internal Medicine

## 2011-04-16 NOTE — Telephone Encounter (Signed)
Dr blyth pt  

## 2011-05-19 ENCOUNTER — Ambulatory Visit (INDEPENDENT_AMBULATORY_CARE_PROVIDER_SITE_OTHER): Payer: BC Managed Care – PPO | Admitting: Family Medicine

## 2011-05-19 ENCOUNTER — Encounter: Payer: Self-pay | Admitting: Family Medicine

## 2011-05-19 VITALS — BP 138/88 | HR 104 | Temp 98.5°F | Ht 69.0 in | Wt 211.4 lb

## 2011-05-19 DIAGNOSIS — E119 Type 2 diabetes mellitus without complications: Secondary | ICD-10-CM

## 2011-05-19 DIAGNOSIS — E669 Obesity, unspecified: Secondary | ICD-10-CM

## 2011-05-19 DIAGNOSIS — J3489 Other specified disorders of nose and nasal sinuses: Secondary | ICD-10-CM

## 2011-05-19 DIAGNOSIS — I10 Essential (primary) hypertension: Secondary | ICD-10-CM

## 2011-05-19 DIAGNOSIS — F17200 Nicotine dependence, unspecified, uncomplicated: Secondary | ICD-10-CM

## 2011-05-19 DIAGNOSIS — E782 Mixed hyperlipidemia: Secondary | ICD-10-CM

## 2011-05-19 DIAGNOSIS — E785 Hyperlipidemia, unspecified: Secondary | ICD-10-CM

## 2011-05-19 DIAGNOSIS — IMO0001 Reserved for inherently not codable concepts without codable children: Secondary | ICD-10-CM

## 2011-05-19 DIAGNOSIS — S335XXA Sprain of ligaments of lumbar spine, initial encounter: Secondary | ICD-10-CM

## 2011-05-19 DIAGNOSIS — Z23 Encounter for immunization: Secondary | ICD-10-CM

## 2011-05-19 LAB — RENAL FUNCTION PANEL
Albumin: 4.9 g/dL (ref 3.5–5.2)
Chloride: 95 mEq/L — ABNORMAL LOW (ref 96–112)
GFR: 141.08 mL/min (ref >60.00)
Phosphorus: 4 mg/dL (ref 2.3–4.6)
Potassium: 4.7 mEq/L (ref 3.5–5.1)
Sodium: 134 mEq/L — ABNORMAL LOW (ref 135–145)

## 2011-05-19 LAB — CBC WITH DIFFERENTIAL/PLATELET
Basophils Relative: 0.6 % (ref 0.0–3.0)
Eosinophils Relative: 0.7 % (ref 0.0–5.0)
Hemoglobin: 17.2 g/dL — ABNORMAL HIGH (ref 13.0–17.0)
Lymphocytes Relative: 17.7 % (ref 12.0–46.0)
Monocytes Relative: 7.9 % (ref 3.0–12.0)
Neutro Abs: 7.9 10*3/uL — ABNORMAL HIGH (ref 1.4–7.7)
RBC: 5.13 Mil/uL (ref 4.22–5.81)
WBC: 10.8 10*3/uL — ABNORMAL HIGH (ref 4.5–10.5)

## 2011-05-19 LAB — LIPID PANEL
Cholesterol: 182 mg/dL (ref 0–200)
HDL: 49 mg/dL (ref >39.00)
LDL Cholesterol: 120 mg/dL — ABNORMAL HIGH (ref 0–99)
Total CHOL/HDL Ratio: 4
Triglycerides: 67 mg/dL (ref 0.0–149.0)

## 2011-05-19 LAB — HEPATIC FUNCTION PANEL
Bilirubin, Direct: 0.2 mg/dL (ref 0.0–0.3)
Total Protein: 8.1 g/dL (ref 6.0–8.3)

## 2011-05-19 NOTE — Assessment & Plan Note (Addendum)
Down to less than 1/2 a ppd, encouraged to attempt quitting altogether, consider trying to develop new habits for the times in the day when he is most vulnerable to smoke a cigarette.

## 2011-05-19 NOTE — Patient Instructions (Signed)
Nicotine Addiction Nicotine can act as both a stimulant (excites/activates) and a sedative (calms/quiets). Immediately after exposure to nicotine, there is a "kick" caused in part by the drug's stimulation of the adrenal glands and resulting discharge of adrenaline (epinephrine). The rush of adrenaline stimulates the body and causes a sudden release of sugar. This means that smokers are always slightly hyperglycemic. Hyperglycemic means that the blood sugar is high, just like in diabetics. Nicotine also decreases the amount of insulin which helps control sugar levels in the body. There is an increase in blood pressure, breathing, and the rate of heart beats.  In addition, nicotine indirectly causes a release of dopamine in the brain that controls pleasure and motivation. A similar reaction is seen with other drugs of abuse, such as cocaine and heroin. This dopamine release is thought to cause the pleasurable sensations when smoking. In some different cases, nicotine can also create a calming effect, depending on sensitivity of the smoker's nervous system and the dose of nicotine taken. WHAT HAPPENS WHEN NICOTINE IS TAKEN FOR LONG PERIODS OF TIME?  Long-term use of nicotine results in addiction. It is difficult to stop.   Repeated use of nicotine creates tolerance. Higher doses of nicotine are needed to get the "kick."  When nicotine use is stopped, withdrawal may last a month or more. Withdrawal may begin within a few hours after the last cigarette. Symptoms peak within the first few days and may lessen within a few weeks. For some people, however, symptoms may last for months or longer. Withdrawal symptoms include:   Irritability.   Craving.   Learning and attention deficits.   Sleep disturbances.   Increased appetite.  Craving for tobacco may last for 6 months or longer. Many behaviors done while using nicotine can also play a part in the severity of withdrawal symptoms. For some people, the  feel, smell, and sight of a cigarette and the ritual of obtaining, handling, lighting, and smoking the cigarette are closely linked with the pleasure of smoking. When stopped, they also miss the related behaviors which make the withdrawal or craving worse. While nicotine gum and patches may lessen the drug aspects of withdrawal, cravings often persist. WHAT ARE THE MEDICAL CONSEQUENCES OF NICOTINE USE?  Nicotine addiction accounts for one-third of all cancers. The top cancer caused by tobacco is lung cancer. Lung cancer is the number one cancer killer of both men and women.   Smoking is also associated with cancers of the:   Mouth.  Pharynx.   Larynx.   Esophagus.  Stomach.   Pancreas.   Cervix.  Kidney.   Ureter.   Bladder.    Smoking also causes lung diseases such as lasting (chronic) bronchitis and emphysema.   It worsens asthma in adults and children.   Smoking increases the risk of heart disease, including:   Stroke.  Heart attack.  Vascular disease.  Aneurysm.   Passive or secondary smoke can also increase medical risks including:   Asthma in children.   Sudden Infant Death Syndrome (SIDS).   Additionally, dropped cigarettes are the leading cause of residential fire fatalities.   Nicotine poisoning has been reported from accidental ingestion of tobacco products by children and pets. Death usually results in a few minutes from respiratory failure (when a person stops breathing) caused by paralysis.  TREATMENT FOR NICOTINE ADDICTION  Medication. Nicotine replacement medicines such as nicotine gum and the patch are used to stop smoking. These medicines gradually lower the dosage of nicotine in  the body. These medicines do not contain the carbon monoxide and other toxins found in tobacco smoke.   Hypnotherapy.   Relaxation therapy.   Nicotine Anonymous (a 12-step support program). Find times and locations in your local yellow pages.  Document Released:  04/09/2004 Document Re-Released: 08/26/2009 [facility] Patient Information 2011 [place], [place].     DARK CHOCOLATE GOOD, CIGARETTES BAD

## 2011-05-22 ENCOUNTER — Ambulatory Visit: Payer: BC Managed Care – PPO | Admitting: Family Medicine

## 2011-05-26 ENCOUNTER — Encounter: Payer: Self-pay | Admitting: Family Medicine

## 2011-05-26 NOTE — Progress Notes (Signed)
Phillip Doyle [id number] Nov 20, 1955 05/26/2011      Progress Note-Follow Up  Subjective  Chief Complaint  Chief Complaint  Patient presents with  . Follow-up    4 month follow up    HPI  Patient is a 55 year old Caucasian male who is in today for followup on multiple medical problems including diabetes. He reports his blood sugars have been well controlled. His evening sugars tend to be 90-110. His highest numbers are often seen first thing in the morning 120s to 140s. Denies polyuria polydipsia or abdominal pain. No recent illness, fevers, chills, chest pain, palpitations, shortness breath, cough, GI or GU concerns. Smoking is down to roughly a third pack per day from a high of 4 packs per day he has had some mild nasal congestion but no rhinorrhea, fevers or chills  Past Medical History  Diagnosis Date  . Allergy     rhinitis  . Hypertension   . Obesity   . Ulcer 1982    peptic ulcer disease  . Impaired glucose tolerance   . Diabetes mellitus   . TOBACCO ABUSE 07/04/2010  . PEPTIC ULCER DISEASE 02/15/2007  . NISSEN FUNDOPLICATION, HX OF 02/15/2007  . Mixed hyperlipidemia 07/04/2010  . LUMBAR STRAIN 10/17/2010  . HYPERTENSION 02/15/2007  . EXOGENOUS OBESITY 02/15/2007  . DM 10/24/2010  . ALLERGIC RHINITIS 02/15/2007    Past Surgical History  Procedure Date  . Rotator cuff repair 2000  . Vasectomy 1982  . Fundiplication for hh and reflux 1987  . Negative stress test 2008    Family History  Problem Relation Age of Onset  . Asthma Mother   . Cancer Mother     uterine   . Alcohol abuse Father   . Stroke Maternal Grandmother     possibly  . Hypertension Maternal Grandfather   . Hypertension Brother   . Cancer Brother     prostate    History   Social History  . Marital Status: Married    Spouse Name: [name]    Number of Children: N/A  . Years of Education: N/A   Occupational History  .  [employer]   Social History Main Topics  . Smoking status: Current Some Day Smoker  -- 1.0 packs/day    Types: Cigarettes  . Smokeless tobacco: Never Used   Comment: quit 06/18/10- started back   . Alcohol Use: 21.0 oz/week    35 Cans of beer per week     3-5 beer daily  . Drug Use: No  . Sexually Active: Yes -- Male partner(s)   Other Topics Concern  . Not on file   Social History Narrative  . No narrative on file    Current Outpatient Prescriptions on File Prior to Visit  Medication Sig Dispense Refill  . albuterol (PROAIR HFA) 108 (90 BASE) MCG/ACT inhaler Inhale 2 puffs into the lungs every 6 (six) hours as needed. For sob/ wheeze/ cough       . cyclobenzaprine (FLEXERIL) 10 MG tablet TAKE 1 TABLET THREE TIMES A DAY AS NEEDED FOR PAIN  40 tablet  1  . Diclofenac Epolamine (FLECTOR) 1.3 % PTCH Place onto the skin. 1 patch to affected area topically q 12 hours as needed       . FREESTYLE LITE test strip       . Lancets (FREESTYLE) lancets       . lisinopril-hydrochlorothiazide (PRINZIDE,ZESTORETIC) 20-25 MG per tablet TAKE 1 TABLET EVERY DAY  90 tablet  2  . metFORMIN (GLUCOPHAGE)  1000 MG tablet Take 1 tablet (1,000 mg total) by mouth 2 (two) times daily with a meal.  60 tablet  5  . metoprolol (TOPROL XL) 200 MG 24 hr tablet Take 1 tablet (200 mg total) by mouth daily.  30 tablet  5  . nicotine (NICOTROL) 10 MG inhaler Inhale 1 puff into the lungs as needed. 1 cartridge po q 2 hours prn cravings. Max of 10 a day  42 each  5  . NON FORMULARY Freestyle Lancets misc- two times a day       . NON FORMULARY Freestyle test strips- bid        . TRADJENTA 5 MG TABS tablet TAKE 1 TABLET BY MOUTH TWICE A DAY  60 tablet  1    No Known Allergies  Review of Systems  Review of Systems  Constitutional: Negative for fever and malaise/fatigue.  HENT: Negative for congestion.   Eyes: Negative for discharge.  Respiratory: Negative for shortness of breath.   Cardiovascular: Negative for chest pain, palpitations and leg swelling.  Gastrointestinal: Negative for nausea,  abdominal pain and diarrhea.  Genitourinary: Negative for dysuria.  Musculoskeletal: Negative for falls.  Skin: Negative for rash.  Neurological: Negative for loss of consciousness and headaches.  Endo/Heme/Allergies: Negative for polydipsia.  Psychiatric/Behavioral: Negative for depression and suicidal ideas. The patient is not nervous/anxious and does not have insomnia.     Objective  BP 138/88  Pulse 104  Temp(Src) 98.5 F (36.9 C) (Oral)  Ht 5\' 9"  (1.753 m)  Wt 211 lb 6.4 oz (95.89 kg)  BMI 31.22 kg/m2  SpO2 96%  Physical Exam  Physical Exam  Constitutional: He is oriented to person, place, and time and well-developed, well-nourished, and in no distress. No distress.  HENT:  Head: Normocephalic and atraumatic.  Eyes: Conjunctivae are normal.  Neck: Neck supple. No thyromegaly present.  Cardiovascular: Normal rate, regular rhythm and normal heart sounds.   No murmur heard. Pulmonary/Chest: Effort normal and breath sounds normal. No respiratory distress.  Abdominal: He exhibits no distension and no mass. There is no tenderness.  Musculoskeletal: He exhibits no edema.  Neurological: He is alert and oriented to person, place, and time.  Skin: Skin is warm.  Psychiatric: Memory, affect and judgment normal.    Lab Results  Component Value Date   TSH 0.97 05/19/2011   Lab Results  Component Value Date   WBC 10.8* 05/19/2011   HGB 17.2* 05/19/2011   HCT 50.5 05/19/2011   MCV 98.4 05/19/2011   PLT 327.0 05/19/2011   Lab Results  Component Value Date   CREATININE 0.6 05/19/2011   BUN 10 05/19/2011   NA 134* 05/19/2011   K 4.7 05/19/2011   CL 95* 05/19/2011   CO2 27 05/19/2011   Lab Results  Component Value Date   ALT 24 05/19/2011   AST 20 05/19/2011   ALKPHOS 67 05/19/2011   BILITOT 0.9 05/19/2011   Lab Results  Component Value Date   CHOL 182 05/19/2011   Lab Results  Component Value Date   HDL 49.00 05/19/2011   Lab Results  Component Value Date   LDLCALC 120*  05/19/2011   Lab Results  Component Value Date   TRIG 67.0 05/19/2011   Lab Results  Component Value Date   CHOLHDL 4 05/19/2011     Assessment & Plan  TOBACCO ABUSE Down to less than 1/2 a ppd, encouraged to attempt quitting altogether, consider trying to develop new habits for the times in the  day when he is most vulnerable to smoke a cigarette.   LUMBAR STRAIN No recent back pain  EXOGENOUS OBESITY Good weight loss since changing his diet to fight his diabetes  MIXED HYPERLIPIDEMIA Avoid trans fats, add a Megared krill oil cap. Increase exercise  DM Great improvement in hgba1c now at 5.9. Continue current meds. Avoid simple carbs

## 2011-05-26 NOTE — Assessment & Plan Note (Signed)
Great improvement in hgba1c now at 5.9. Continue current meds. Avoid simple carbs

## 2011-05-26 NOTE — Assessment & Plan Note (Signed)
No recent back pain

## 2011-05-26 NOTE — Assessment & Plan Note (Signed)
Avoid trans fats, add a Megared krill oil cap. Increase exercise

## 2011-05-26 NOTE — Assessment & Plan Note (Signed)
Good weight loss since changing his diet to fight his diabetes

## 2011-05-29 ENCOUNTER — Other Ambulatory Visit: Payer: Self-pay | Admitting: Family Medicine

## 2011-07-10 ENCOUNTER — Other Ambulatory Visit: Payer: Self-pay | Admitting: Family Medicine

## 2011-07-23 ENCOUNTER — Other Ambulatory Visit: Payer: Self-pay | Admitting: Family Medicine

## 2011-08-20 ENCOUNTER — Other Ambulatory Visit: Payer: Self-pay | Admitting: Family Medicine

## 2011-08-20 ENCOUNTER — Encounter: Payer: Self-pay | Admitting: Family Medicine

## 2011-08-20 ENCOUNTER — Ambulatory Visit (INDEPENDENT_AMBULATORY_CARE_PROVIDER_SITE_OTHER): Payer: BC Managed Care – PPO | Admitting: Family Medicine

## 2011-08-20 DIAGNOSIS — E782 Mixed hyperlipidemia: Secondary | ICD-10-CM

## 2011-08-20 DIAGNOSIS — E119 Type 2 diabetes mellitus without complications: Secondary | ICD-10-CM

## 2011-08-20 DIAGNOSIS — F17200 Nicotine dependence, unspecified, uncomplicated: Secondary | ICD-10-CM

## 2011-08-20 DIAGNOSIS — I10 Essential (primary) hypertension: Secondary | ICD-10-CM

## 2011-08-20 DIAGNOSIS — E785 Hyperlipidemia, unspecified: Secondary | ICD-10-CM

## 2011-08-20 HISTORY — DX: Essential (primary) hypertension: I10

## 2011-08-20 LAB — CBC
MCHC: 34 g/dL (ref 30.0–36.0)
MCV: 98.8 fl (ref 78.0–100.0)

## 2011-08-20 LAB — LIPID PANEL
Cholesterol: 179 mg/dL (ref 0–200)
LDL Cholesterol: 104 mg/dL — ABNORMAL HIGH (ref 0–99)
Total CHOL/HDL Ratio: 3
Triglycerides: 104 mg/dL (ref 0.0–149.0)
VLDL: 20.8 mg/dL (ref 0.0–40.0)

## 2011-08-20 LAB — HEPATIC FUNCTION PANEL
ALT: 21 U/L (ref 0–53)
Alkaline Phosphatase: 54 U/L (ref 39–117)

## 2011-08-20 LAB — RENAL FUNCTION PANEL
Albumin: 4.7 g/dL (ref 3.5–5.2)
BUN: 13 mg/dL (ref 6–23)
CO2: 30 mEq/L (ref 19–32)
Creatinine, Ser: 0.7 mg/dL (ref 0.4–1.5)
GFR: 120.82 mL/min (ref >60.00)

## 2011-08-20 LAB — HEMOGLOBIN A1C: Hgb A1c MFr Bld: 5.6 % (ref 4.6–6.5)

## 2011-08-20 MED ORDER — METFORMIN HCL 1000 MG PO TABS: 1000.0000 mg | ORAL_TABLET | Freq: Two times a day (BID) | ORAL | Status: DC

## 2011-08-20 NOTE — Assessment & Plan Note (Signed)
Patient is doing much better, his bs have been 120-135 at worst. No polyuria, polydipsia, given samples of Trajendta and no changes in meds.

## 2011-08-20 NOTE — Patient Instructions (Signed)

## 2011-08-20 NOTE — Assessment & Plan Note (Signed)
Still smoking about a quarter pack per day, encouraged to try complete cessation today

## 2011-08-20 NOTE — Assessment & Plan Note (Signed)
Patient getting lipid panel, avoid trans fats continue current meds

## 2011-08-20 NOTE — Progress Notes (Signed)
Patient ID: Phillip Doyle, male   DOB: October 14, 1955, 56 y.o.   MRN: [id number] Phillip Doyle [id number] Apr 21, 1956 08/20/2011     Review of Systems  Constitutional: Negative for fever and malaise/fatigue.  HENT: Negative for congestion.   Eyes: Negative for discharge.  Respiratory: Negative for shortness of breath.   Cardiovascular: Negative for chest pain, palpitations and leg swelling.  Gastrointestinal: Negative for nausea, abdominal pain and diarrhea.  Genitourinary: Negative for dysuria.  Musculoskeletal: Negative for falls.  Skin: Negative for rash.  Neurological: Negative for loss of consciousness and headaches.  Endo/Heme/Allergies: Negative for polydipsia.  Psychiatric/Behavioral: Negative for depression and suicidal ideas. The patient is not nervous/anxious and does not have insomnia.         Physical Exam  Constitutional: He is oriented to person, place, and time and well-developed, well-nourished, and in no distress. No distress.  HENT:  Head: Normocephalic and atraumatic.  Eyes: Conjunctivae are normal.  Neck: Neck supple. No thyromegaly present.  Cardiovascular: Normal rate, regular rhythm and normal heart sounds.   No murmur heard. Pulmonary/Chest: Effort normal and breath sounds normal. No respiratory distress.  Abdominal: He exhibits no distension and no mass. There is no tenderness.  Musculoskeletal: He exhibits no edema.  Neurological: He is alert and oriented to person, place, and time.  Skin: Skin is warm.  Psychiatric: Memory, affect and judgment normalMarland Kitchen          Phillip Doyle [id number] Jul 21, 1956 08/20/2011      Progress Note-Follow Up  Subjective  Chief Complaint  Chief Complaint  Patient presents with  . Follow-up    3 month follow up    HPI  Patient is a 56 year old Caucasian male in today for followup on multiple medical problems. He reports overall feeling well. He has changed his diet significantly. He is eating less fast food, transect  and simple carbs and unfortunately he continues to smoke about a quarter pack per day. He is active physically in his job and at home but is not exercising regularly. He denies any polyuria or polydipsia. No recent illness, fevers, chills, chest pain, palpitations or shortness of breath, GI or GU complaints noted today.  Past Medical History  Diagnosis Date  . Allergy     rhinitis  . Hypertension   . Obesity   . Ulcer 1982    peptic ulcer disease  . Impaired glucose tolerance   . Diabetes mellitus   . TOBACCO ABUSE 07/04/2010  . PEPTIC ULCER DISEASE 02/15/2007  . NISSEN FUNDOPLICATION, HX OF 02/15/2007  . Mixed hyperlipidemia 07/04/2010  . LUMBAR STRAIN 10/17/2010  . HYPERTENSION 02/15/2007  . EXOGENOUS OBESITY 02/15/2007  . DM 10/24/2010  . ALLERGIC RHINITIS 02/15/2007  . HTN (hypertension) 08/20/2011    Past Surgical History  Procedure Date  . Rotator cuff repair 2000  . Vasectomy 1982  . Fundiplication for hh and reflux 1987  . Negative stress test 2008    Family History  Problem Relation Age of Onset  . Asthma Mother   . Cancer Mother     uterine   . Alcohol abuse Father   . Stroke Maternal Grandmother     possibly  . Hypertension Maternal Grandfather   . Hypertension Brother   . Cancer Brother     prostate    History   Social History  . Marital Status: Married    Spouse Name: [name]    Number of Children: N/A  . Years of Education: N/A   Occupational History  .  [employer]   Social History Main Topics  . Smoking status: Current Some Day Smoker -- 1.0 packs/day    Types: Cigarettes  . Smokeless tobacco: Never Used   Comment: quit 06/18/10- started back   . Alcohol Use: 21.0 oz/week    35 Cans of beer per week     3-5 beer daily  . Drug Use: No  . Sexually Active: Yes -- Male partner(s)   Other Topics Concern  . Not on file   Social History Narrative  . No narrative on file    Current Outpatient Prescriptions on File Prior to Visit  Medication Sig  Dispense Refill  . albuterol (PROAIR HFA) 108 (90 BASE) MCG/ACT inhaler Inhale 2 puffs into the lungs every 6 (six) hours as needed. For sob/ wheeze/ cough       . cyclobenzaprine (FLEXERIL) 10 MG tablet TAKE 1 TABLET THREE TIMES A DAY AS NEEDED FOR PAIN  40 tablet  1  . Diclofenac Epolamine (FLECTOR) 1.3 % PTCH Place onto the skin. 1 patch to affected area topically q 12 hours as needed       . FREESTYLE LITE test strip       . Lancets (FREESTYLE) lancets       . lisinopril-hydrochlorothiazide (PRINZIDE,ZESTORETIC) 20-25 MG per tablet TAKE 1 TABLET EVERY DAY  90 tablet  2  . metoprolol (TOPROL-XL) 200 MG 24 hr tablet TAKE 1 TABLET BY MOUTH EVERY DAY  30 tablet  5  . nicotine (NICOTROL) 10 MG inhaler Inhale 1 puff into the lungs as needed. 1 cartridge po q 2 hours prn cravings. Max of 10 a day  42 each  5  . NON FORMULARY Freestyle Lancets misc- two times a day       . NON FORMULARY Freestyle test strips- bid        . TRADJENTA 5 MG TABS tablet TAKE 1 TABLET BY MOUTH TWICE A DAY  60 tablet  1    No Known Allergies  Review of Systems  Objective  BP 152/94  Pulse 89  Temp(Src) 98.4 F (36.9 C) (Oral)  Ht 5\' 9"  (1.753 m)  Wt 207 lb 6.4 oz (94.076 kg)  BMI 30.63 kg/m2  SpO2 96%  Physical Exam  Physical Exam  Constitutional: He is oriented to person, place, and time and well-developed, well-nourished, and in no distress. No distress.  HENT:  Head: Normocephalic and atraumatic.  Eyes: Conjunctivae are normal.  Neck: Neck supple. No thyromegaly present.  Cardiovascular: Normal rate, regular rhythm and normal heart sounds.   No murmur heard. Pulmonary/Chest: Effort normal and breath sounds normal. No respiratory distress.  Abdominal: He exhibits no distension and no mass. There is no tenderness.  Musculoskeletal: He exhibits no edema.  Neurological: He is alert and oriented to person, place, and time.  Skin: Skin is warm.  Psychiatric: Memory, affect and judgment normal.     Lab Results  Component Value Date   TSH 0.97 05/19/2011   Lab Results  Component Value Date   WBC 10.8* 05/19/2011   HGB 17.2* 05/19/2011   HCT 50.5 05/19/2011   MCV 98.4 05/19/2011   PLT 327.0 05/19/2011   Lab Results  Component Value Date   CREATININE 0.6 05/19/2011   BUN 10 05/19/2011   NA 134* 05/19/2011   K 4.7 05/19/2011   CL 95* 05/19/2011   CO2 27 05/19/2011   Lab Results  Component Value Date   ALT 24 05/19/2011   AST 20 05/19/2011   ALKPHOS  67 05/19/2011   BILITOT 0.9 05/19/2011   Lab Results  Component Value Date   CHOL 182 05/19/2011   Lab Results  Component Value Date   HDL 49.00 05/19/2011   Lab Results  Component Value Date   LDLCALC 120* 05/19/2011   Lab Results  Component Value Date   TRIG 67.0 05/19/2011   Lab Results  Component Value Date   CHOLHDL 4 05/19/2011     Assessment & Plan  DM Patient is doing much better, his bs have been 120-135 at worst. No polyuria, polydipsia, given samples of Trajendta and no changes in meds.  MIXED HYPERLIPIDEMIA Patient getting lipid panel, avoid trans fats continue current meds  HTN (hypertension) Improved will not change meds today  TOBACCO ABUSE Still smoking about a quarter pack per day, encouraged to try complete cessation today

## 2011-08-20 NOTE — Assessment & Plan Note (Addendum)
Improved will not change meds today

## 2011-08-21 ENCOUNTER — Other Ambulatory Visit: Payer: Self-pay | Admitting: Family Medicine

## 2011-08-22 ENCOUNTER — Other Ambulatory Visit: Payer: Self-pay | Admitting: *Deleted

## 2011-08-22 NOTE — Telephone Encounter (Signed)
RC to pt.  Phillip Doyle sent 90 day RX of metformin for one year to pharmacy.  Advised I will make sure pharmacy has.  Verbal order given to Debra.

## 2011-09-23 ENCOUNTER — Other Ambulatory Visit: Payer: Self-pay | Admitting: Family Medicine

## 2011-10-28 ENCOUNTER — Telehealth: Payer: Self-pay

## 2011-10-28 ENCOUNTER — Other Ambulatory Visit: Payer: Self-pay

## 2011-10-28 MED ORDER — GLUCOSE BLOOD VI STRP: 1.0000 | ORAL_STRIP | Freq: Two times a day (BID) | Status: DC

## 2011-10-28 MED ORDER — FREESTYLE LANCETS MISC: Status: DC

## 2011-10-28 MED ORDER — GLUCOSE BLOOD VI STRP: ORAL_STRIP | Status: DC

## 2011-10-28 MED ORDER — FREESTYLE LANCETS MISC: 1.0000 | Freq: Two times a day (BID) | Status: DC

## 2011-10-28 NOTE — Telephone Encounter (Signed)
Pharmacy needs to know how many times pt is to test his BS? Patient states he checks once in am and once in pm.

## 2011-11-20 ENCOUNTER — Other Ambulatory Visit: Payer: Self-pay | Admitting: Family Medicine

## 2011-12-04 ENCOUNTER — Telehealth: Payer: Self-pay

## 2011-12-04 NOTE — Telephone Encounter (Signed)
FYI: Patient called stating he was going to send a fax over because his insurance will no longer pay for Tradjenta? The paperwork has 2 other names of medication it can be switched to? I will put paperwork on MD's desk when we get it.

## 2011-12-09 ENCOUNTER — Telehealth: Payer: Self-pay

## 2011-12-09 ENCOUNTER — Other Ambulatory Visit: Payer: Self-pay

## 2011-12-09 MED ORDER — SITAGLIPTIN PHOSPHATE 100 MG PO TABS: 100.0000 mg | ORAL_TABLET | Freq: Every day | ORAL | Status: DC

## 2011-12-09 NOTE — Telephone Encounter (Signed)
I left a message for patient to return my call. Need to inform pt we changed the Tradjenta to Januvia due to insurance reasons.

## 2011-12-09 NOTE — Telephone Encounter (Signed)
Pt informed

## 2012-01-20 ENCOUNTER — Other Ambulatory Visit: Payer: Self-pay | Admitting: *Deleted

## 2012-01-20 MED ORDER — METOPROLOL SUCCINATE ER 200 MG PO TB24: 200.0000 mg | ORAL_TABLET | Freq: Every day | ORAL | Status: DC

## 2012-01-20 MED ORDER — CYCLOBENZAPRINE HCL 10 MG PO TABS: 10.0000 mg | ORAL_TABLET | Freq: Three times a day (TID) | ORAL | Status: DC | PRN

## 2012-01-20 MED ORDER — LISINOPRIL-HYDROCHLOROTHIAZIDE 20-25 MG PO TABS: 1.0000 | ORAL_TABLET | Freq: Every day | ORAL | Status: DC

## 2012-01-20 NOTE — Telephone Encounter (Addendum)
Faxed refill request received from pharmacy for cyclobenzaprine Last filled by MD on 04/01/11, #40 x 1 Last seen on 08/20/11 Follow up 6 months scheduled on 02/23/12. Please advise refills. Request also for lisinopril-HCTZ, this is sent.

## 2012-02-17 ENCOUNTER — Ambulatory Visit (INDEPENDENT_AMBULATORY_CARE_PROVIDER_SITE_OTHER): Payer: BC Managed Care – PPO | Admitting: Family Medicine

## 2012-02-17 ENCOUNTER — Ambulatory Visit: Payer: BC Managed Care – PPO | Admitting: Family Medicine

## 2012-02-17 ENCOUNTER — Encounter: Payer: Self-pay | Admitting: Family Medicine

## 2012-02-17 VITALS — BP 136/86 | HR 83 | Temp 97.8°F | Ht 69.0 in | Wt 198.0 lb

## 2012-02-17 DIAGNOSIS — Z1211 Encounter for screening for malignant neoplasm of colon: Secondary | ICD-10-CM

## 2012-02-17 DIAGNOSIS — M179 Osteoarthritis of knee, unspecified: Secondary | ICD-10-CM

## 2012-02-17 DIAGNOSIS — Z0000 Encounter for general adult medical examination without abnormal findings: Secondary | ICD-10-CM

## 2012-02-17 DIAGNOSIS — E119 Type 2 diabetes mellitus without complications: Secondary | ICD-10-CM

## 2012-02-17 DIAGNOSIS — I10 Essential (primary) hypertension: Secondary | ICD-10-CM

## 2012-02-17 DIAGNOSIS — S335XXA Sprain of ligaments of lumbar spine, initial encounter: Secondary | ICD-10-CM

## 2012-02-17 DIAGNOSIS — IMO0002 Reserved for concepts with insufficient information to code with codable children: Secondary | ICD-10-CM

## 2012-02-17 DIAGNOSIS — E782 Mixed hyperlipidemia: Secondary | ICD-10-CM

## 2012-02-17 DIAGNOSIS — M1710 Unilateral primary osteoarthritis, unspecified knee: Secondary | ICD-10-CM

## 2012-02-17 DIAGNOSIS — F17200 Nicotine dependence, unspecified, uncomplicated: Secondary | ICD-10-CM

## 2012-02-17 DIAGNOSIS — N529 Male erectile dysfunction, unspecified: Secondary | ICD-10-CM

## 2012-02-17 DIAGNOSIS — E669 Obesity, unspecified: Secondary | ICD-10-CM

## 2012-02-17 HISTORY — DX: Male erectile dysfunction, unspecified: N52.9

## 2012-02-17 HISTORY — DX: Unilateral primary osteoarthritis, unspecified knee: M17.10

## 2012-02-17 HISTORY — DX: Osteoarthritis of knee, unspecified: M17.9

## 2012-02-17 LAB — CBC
Hemoglobin: 15.4 g/dL (ref 13.0–17.0)
MCHC: 33.5 g/dL (ref 30.0–36.0)
MCV: 99.1 fl (ref 78.0–100.0)
RDW: 13.3 % (ref 11.5–14.6)

## 2012-02-17 LAB — RENAL FUNCTION PANEL
CO2: 29 mEq/L (ref 19–32)
Calcium: 9.6 mg/dL (ref 8.4–10.5)
Chloride: 98 mEq/L (ref 96–112)
Potassium: 4.4 mEq/L (ref 3.5–5.1)
Sodium: 135 mEq/L (ref 135–145)

## 2012-02-17 LAB — LIPID PANEL
LDL Cholesterol: 83 mg/dL (ref 0–99)
Total CHOL/HDL Ratio: 3
Triglycerides: 93 mg/dL (ref 0.0–149.0)

## 2012-02-17 LAB — MICROALBUMIN / CREATININE URINE RATIO
Creatinine,U: 58.9 mg/dL
Microalb, Ur: 1.1 mg/dL (ref 0.0–1.9)

## 2012-02-17 LAB — HEPATIC FUNCTION PANEL
ALT: 15 U/L (ref 0–53)
AST: 11 U/L (ref 0–37)
Bilirubin, Direct: 0 mg/dL (ref 0.0–0.3)
Total Bilirubin: 0.7 mg/dL (ref 0.3–1.2)

## 2012-02-17 MED ORDER — SILDENAFIL CITRATE 100 MG PO TABS: 50.0000 mg | ORAL_TABLET | Freq: Every day | ORAL | Status: DC | PRN

## 2012-02-17 NOTE — Assessment & Plan Note (Signed)
Continues to loose weight and folow a good heart healthy diet with decreased simple carbs and fatty foods, encouraged further attempts

## 2012-02-17 NOTE — Assessment & Plan Note (Signed)
Well controlled at present his numbers have been well controlled in the 90 to 120 range, no concerning symptoms, check a hgba1c today

## 2012-02-17 NOTE — Assessment & Plan Note (Signed)
New c/o, given a trial of Viagra and encouraged to quit smoking to keep this from worsening further

## 2012-02-17 NOTE — Patient Instructions (Addendum)
Nicotine Addiction Nicotine can act as both a stimulant (excites/activates) and a sedative (calms/quiets). Immediately after exposure to nicotine, there is a "kick" caused in part by the drug's stimulation of the adrenal glands and resulting discharge of adrenaline (epinephrine). The rush of adrenaline stimulates the body and causes a sudden release of sugar. This means that smokers are always slightly hyperglycemic. Hyperglycemic means that the blood sugar is high, just like in diabetics. Nicotine also decreases the amount of insulin which helps control sugar levels in the body. There is an increase in blood pressure, breathing, and the rate of heart beats.  In addition, nicotine indirectly causes a release of dopamine in the brain that controls pleasure and motivation. A similar reaction is seen with other drugs of abuse, such as cocaine and heroin. This dopamine release is thought to cause the pleasurable sensations when smoking. In some different cases, nicotine can also create a calming effect, depending on sensitivity of the smoker's nervous system and the dose of nicotine taken. WHAT HAPPENS WHEN NICOTINE IS TAKEN FOR LONG PERIODS OF TIME?  Long-term use of nicotine results in addiction. It is difficult to stop.   Repeated use of nicotine creates tolerance. Higher doses of nicotine are needed to get the "kick."  When nicotine use is stopped, withdrawal may last a month or more. Withdrawal may begin within a few hours after the last cigarette. Symptoms peak within the first few days and may lessen within a few weeks. For some people, however, symptoms may last for months or longer. Withdrawal symptoms include:   Irritability.   Craving.   Learning and attention deficits.   Sleep disturbances.   Increased appetite.  Craving for tobacco may last for 6 months or longer. Many behaviors done while using nicotine can also play a part in the severity of withdrawal symptoms. For some people, the  feel, smell, and sight of a cigarette and the ritual of obtaining, handling, lighting, and smoking the cigarette are closely linked with the pleasure of smoking. When stopped, they also miss the related behaviors which make the withdrawal or craving worse. While nicotine gum and patches may lessen the drug aspects of withdrawal, cravings often persist. WHAT ARE THE MEDICAL CONSEQUENCES OF NICOTINE USE?  Nicotine addiction accounts for one-third of all cancers. The top cancer caused by tobacco is lung cancer. Lung cancer is the number one cancer killer of both men and women.   Smoking is also associated with cancers of the:   Mouth.   Pharynx.   Larynx.   Esophagus.   Stomach.   Pancreas.   Cervix.   Kidney.   Ureter.   Bladder.   Smoking also causes lung diseases such as lasting (chronic) bronchitis and emphysema.   It worsens asthma in adults and children.   Smoking increases the risk of heart disease, including:   Stroke.   Heart attack.   Vascular disease.   Aneurysm.   Passive or secondary smoke can also increase medical risks including:   Asthma in children.   Sudden Infant Death Syndrome (SIDS).   Additionally, dropped cigarettes are the leading cause of residential fire fatalities.   Nicotine poisoning has been reported from accidental ingestion of tobacco products by children and pets. Death usually results in a few minutes from respiratory failure (when a person stops breathing) caused by paralysis.  TREATMENT   Medication. Nicotine replacement medicines such as nicotine gum and the patch are used to stop smoking. These medicines gradually lower the dosage   of nicotine in the body. These medicines do not contain the carbon monoxide and other toxins found in tobacco smoke.   Hypnotherapy.   Relaxation therapy.   Nicotine Anonymous (a 12-step support program). Find times and locations in your local yellow pages.  Document Released: 04/09/2004 Document  Revised: 07/24/2011 Document Reviewed: 09/01/2007 [employer] Patient Information 2012 [employer]. 

## 2012-02-17 NOTE — Assessment & Plan Note (Signed)
Check lipids and continue fish oil caps.

## 2012-02-17 NOTE — Assessment & Plan Note (Signed)
Down to a third pack a day. Encouraged complete cessation

## 2012-02-17 NOTE — Progress Notes (Signed)
Patient ID: Phillip Doyle, male   DOB: 22-May-1956, 56 y.o.   MRN: [id number] ASANI MCBURNEY [id number] 12/14/1955 02/17/2012      Progress Note-Follow Up  Subjective  Chief Complaint  Chief Complaint  Patient presents with  . Follow-up    6 month    HPI  Patient is a 56 year old Caucasian male who is in today for followup on multiple medical problems. Overall is doing well. He continues to try and stay active and eat a heart healthy diet. Has lost even more weight. Blood sugars are ranging from 97-120 and he denies any polyuria or polydipsia. He reports his blood pressures have been in the 120s to 130s over 60s to 70s. Unfortunately continues to smoke about a third pack per day. Denies any shortness of breath, chest pain, palpitations, recent illness, fevers, GI or GU complaints. His knee and back pain have greatly improved with his weight loss. Overall he reports feeling significantly improved.  Past Medical History  Diagnosis Date  . Allergy     rhinitis  . Hypertension   . Obesity   . Ulcer 1982    peptic ulcer disease  . Impaired glucose tolerance   . Diabetes mellitus   . TOBACCO ABUSE 07/04/2010  . PEPTIC ULCER DISEASE 02/15/2007  . NISSEN FUNDOPLICATION, HX OF 02/15/2007  . Mixed hyperlipidemia 07/04/2010  . LUMBAR STRAIN 10/17/2010  . HYPERTENSION 02/15/2007  . EXOGENOUS OBESITY 02/15/2007  . DM 10/24/2010  . ALLERGIC RHINITIS 02/15/2007  . HTN (hypertension) 08/20/2011  . OA (osteoarthritis) of knee 02/17/2012  . ED (erectile dysfunction) 02/17/2012    Past Surgical History  Procedure Date  . Rotator cuff repair 2000  . Vasectomy 1982  . Fundiplication for hh and reflux 1987  . Negative stress test 2008    Family History  Problem Relation Age of Onset  . Asthma Mother   . Cancer Mother     uterine   . Alcohol abuse Father   . Stroke Maternal Grandmother     possibly  . Hypertension Maternal Grandfather   . Hypertension Brother   . Cancer Brother     prostate     History   Social History  . Marital Status: Married    Spouse Name: [name]    Number of Children: N/A  . Years of Education: N/A   Occupational History  .  [employer]   Social History Main Topics  . Smoking status: Current Everyday Smoker -- 0.3 packs/day    Types: Cigarettes  . Smokeless tobacco: Never Used   Comment: quit 06/18/10- started back   . Alcohol Use: 21.0 oz/week    35 Cans of beer per week     3-5 beer daily  . Drug Use: No  . Sexually Active: Yes -- Male partner(s)   Other Topics Concern  . Not on file   Social History Narrative  . No narrative on file    Current Outpatient Prescriptions on File Prior to Visit  Medication Sig Dispense Refill  . Acetaminophen (TYLENOL ARTHRITIS PAIN PO) Take by mouth as needed.        . fish oil-omega-3 fatty acids 1000 MG capsule Take 2 g by mouth daily.        Marland Kitchen glucose blood test strip 1 each by Other route 2 (two) times daily. Diag. Code 250.00  100 each  1  . Lancets (FREESTYLE) lancets 1 each by Other route 2 (two) times daily. diag code 250.00  100 each  1  .  lisinopril-hydrochlorothiazide (PRINZIDE,ZESTORETIC) 20-25 MG per tablet Take 1 tablet by mouth daily.  90 tablet  2  . metFORMIN (GLUCOPHAGE) 1000 MG tablet Take 1 tablet (1,000 mg total) by mouth 2 (two) times daily with a meal.  180 tablet  3  . metoprolol (TOPROL-XL) 200 MG 24 hr tablet Take 1 tablet (200 mg total) by mouth daily.  30 tablet  5  . nicotine (NICOTROL) 10 MG inhaler Inhale 1 puff into the lungs as needed. 1 cartridge po q 2 hours prn cravings. Max of 10 a day  42 each  5  . NON FORMULARY Freestyle Lancets misc- two times a day       . NON FORMULARY Freestyle test strips- bid        . sitaGLIPtin (JANUVIA) 100 MG tablet Take 1 tablet (100 mg total) by mouth daily.  30 tablet  3  . cyclobenzaprine (FLEXERIL) 10 MG tablet Take 1 tablet (10 mg total) by mouth 3 (three) times daily as needed for muscle spasms.  40 tablet  1  . Diclofenac Epolamine  (FLECTOR) 1.3 % PTCH Place onto the skin. 1 patch to affected area topically q 12 hours as needed       . sildenafil (VIAGRA) 100 MG tablet Take 0.5-1 tablets (50-100 mg total) by mouth daily as needed for erectile dysfunction.  10 tablet  3    No Known Allergies  Review of Systems  Review of Systems  Constitutional: Negative for fever and malaise/fatigue.  HENT: Negative for congestion.   Eyes: Negative for discharge.  Respiratory: Negative for shortness of breath.   Cardiovascular: Negative for chest pain, palpitations and leg swelling.  Gastrointestinal: Negative for nausea, abdominal pain and diarrhea.  Genitourinary: Negative for dysuria.  Musculoskeletal: Negative for falls.  Skin: Negative for rash.  Neurological: Negative for loss of consciousness and headaches.  Endo/Heme/Allergies: Negative for polydipsia.  Psychiatric/Behavioral: Negative for depression and suicidal ideas. The patient is not nervous/anxious and does not have insomnia.     Objective  BP 136/86  Pulse 83  Temp 97.8 F (36.6 C) (Temporal)  Ht 5\' 9"  (1.753 m)  Wt 198 lb (89.812 kg)  BMI 29.24 kg/m2  SpO2 97%  Physical Exam  Physical Exam  Constitutional: He is oriented to person, place, and time and well-developed, well-nourished, and in no distress. No distress.  HENT:  Head: Normocephalic and atraumatic.  Eyes: Conjunctivae are normal.  Neck: Neck supple. No thyromegaly present.  Cardiovascular: Normal rate, regular rhythm and normal heart sounds.   No murmur heard. Pulmonary/Chest: Effort normal and breath sounds normal. No respiratory distress.  Abdominal: He exhibits no distension and no mass. There is no tenderness.  Musculoskeletal: He exhibits no edema.       Foot exam DP and PT pulses 2 + b/l. No skin breakdown or lesions, hair on toes. No numbness or sensory deficiencies  Neurological: He is alert and oriented to person, place, and time.  Skin: Skin is warm.  Psychiatric: Memory,  affect and judgment normal.    Lab Results  Component Value Date   TSH 0.80 08/20/2011   Lab Results  Component Value Date   WBC 8.1 08/20/2011   HGB 15.6 08/20/2011   HCT 46.0 08/20/2011   MCV 98.8 08/20/2011   PLT 345.0 08/20/2011   Lab Results  Component Value Date   CREATININE 0.7 08/20/2011   BUN 13 08/20/2011   NA 138 08/20/2011   K 4.6 08/20/2011   CL 99 08/20/2011  CO2 30 08/20/2011   Lab Results  Component Value Date   ALT 21 08/20/2011   AST 15 08/20/2011   ALKPHOS 54 08/20/2011   BILITOT 0.8 08/20/2011   Lab Results  Component Value Date   CHOL 179 08/20/2011   Lab Results  Component Value Date   HDL 54.00 08/20/2011   Lab Results  Component Value Date   LDLCALC 104* 08/20/2011   Lab Results  Component Value Date   TRIG 104.0 08/20/2011   Lab Results  Component Value Date   CHOLHDL 3 08/20/2011     Assessment & Plan  TOBACCO ABUSE Down to a third pack a day. Encouraged complete cessation  EXOGENOUS OBESITY Continues to loose weight and folow a good heart healthy diet with decreased simple carbs and fatty foods, encouraged further attempts  HTN (hypertension) Improved control on repeat test, no change in meds today  DM Well controlled at present his numbers have been well controlled in the 90 to 120 range, no concerning symptoms, check a hgba1c today  LUMBAR STRAIN Back pain resolved with weight loss  MIXED HYPERLIPIDEMIA Check lipids and continue fish oil caps.  ED (erectile dysfunction) New c/o, given a trial of Viagra and encouraged to quit smoking to keep this from worsening further

## 2012-02-17 NOTE — Assessment & Plan Note (Signed)
Back pain resolved with weight loss

## 2012-02-17 NOTE — Assessment & Plan Note (Signed)
Improved control on repeat test, no change in meds today

## 2012-02-20 ENCOUNTER — Encounter: Payer: Self-pay | Admitting: Internal Medicine

## 2012-02-20 ENCOUNTER — Encounter: Payer: Self-pay | Admitting: Family Medicine

## 2012-02-23 ENCOUNTER — Ambulatory Visit: Payer: BC Managed Care – PPO | Admitting: Family Medicine

## 2012-03-18 HISTORY — PX: COLONOSCOPY: SHX174

## 2012-03-29 ENCOUNTER — Ambulatory Visit (AMBULATORY_SURGERY_CENTER): Payer: BC Managed Care – PPO

## 2012-03-29 VITALS — Ht 69.0 in | Wt 196.0 lb

## 2012-03-29 DIAGNOSIS — Z1211 Encounter for screening for malignant neoplasm of colon: Secondary | ICD-10-CM

## 2012-03-29 MED ORDER — NA SULFATE-K SULFATE-MG SULF 17.5-3.13-1.6 GM/177ML PO SOLN: 1.0000 | Freq: Once | ORAL | Status: AC

## 2012-04-01 ENCOUNTER — Telehealth: Payer: Self-pay | Admitting: Family Medicine

## 2012-04-01 NOTE — Telephone Encounter (Signed)
That question has to be posed to the gastroenterologist, they would probably have to reshedule to do both at the same time but I am not sure that the gastroenterologist would agree to do both at the same time given his risk factors.

## 2012-04-01 NOTE — Telephone Encounter (Signed)
LM for patient's wife to [facility]

## 2012-04-01 NOTE — Telephone Encounter (Signed)
He has a scheduled colonoscopy on 8/26. He's had a hiatal surgery back in 80's. Would it be to his advantage to also have an endoscopy done at the same time? Please only talk to patient's wife [name] [phone] cell, she is concerned about his anxiety already so only talk to her. [facility] advised patient that they would be "putting him out completely" so maybe this would be a good time to do the endo.

## 2012-04-07 NOTE — Telephone Encounter (Signed)
Read Dr Mariel Aloe response to patient's wife

## 2012-04-12 ENCOUNTER — Encounter: Payer: Self-pay | Admitting: Internal Medicine

## 2012-04-12 ENCOUNTER — Ambulatory Visit (AMBULATORY_SURGERY_CENTER): Payer: BC Managed Care – PPO | Admitting: Internal Medicine

## 2012-04-12 VITALS — BP 149/88 | HR 85 | Temp 96.3°F | Resp 18 | Ht 69.0 in | Wt 196.0 lb

## 2012-04-12 DIAGNOSIS — D126 Benign neoplasm of colon, unspecified: Secondary | ICD-10-CM

## 2012-04-12 DIAGNOSIS — K635 Polyp of colon: Secondary | ICD-10-CM

## 2012-04-12 DIAGNOSIS — Z1211 Encounter for screening for malignant neoplasm of colon: Secondary | ICD-10-CM

## 2012-04-12 DIAGNOSIS — D129 Benign neoplasm of anus and anal canal: Secondary | ICD-10-CM

## 2012-04-12 DIAGNOSIS — D128 Benign neoplasm of rectum: Secondary | ICD-10-CM

## 2012-04-12 LAB — GLUCOSE, CAPILLARY

## 2012-04-12 MED ORDER — SODIUM CHLORIDE 0.9 % IV SOLN: 500.0000 mL | INTRAVENOUS | Status: DC

## 2012-04-12 NOTE — Progress Notes (Signed)
Patient did not experience any of the following events: a burn prior to discharge; a fall within the facility; wrong site/side/patient/procedure/implant event; or a hospital transfer or hospital admission upon discharge from the facility. (G8907) Patient did not have preoperative order for IV antibiotic SSI prophylaxis. (G8918)  

## 2012-04-12 NOTE — Patient Instructions (Addendum)

## 2012-04-12 NOTE — Progress Notes (Signed)
The pt tolerated the colonoscopy very well. Maw   

## 2012-04-12 NOTE — Progress Notes (Signed)
11:48 2nd bag of normal saline hung by Marrion Coy, CRNA 500 ml. Maw

## 2012-04-12 NOTE — Op Note (Signed)
[facility] [address] [place], [address]   COLONOSCOPY PROCEDURE REPORT  PATIENT: Phillip, Doyle  MR#: [id number] BIRTHDATE: 1955-11-12 , 55  yrs. old GENDER: Male ENDOSCOPIST: Beverley Fiedler, MD REFERRED XB:JYNWG, Stacy PROCEDURE DATE:  04/12/2012 PROCEDURE:   Colonoscopy with snare polypectomy ASA CLASS:   Class III INDICATIONS:average risk screening and first colonoscopy. MEDICATIONS: MAC sedation, administered by CRNA and Propofol (Diprivan) 550 mg IV  DESCRIPTION OF PROCEDURE:   After the risks benefits and alternatives of the procedure were thoroughly explained, informed consent was obtained.  A digital rectal exam revealed no rectal mass.   The LB [id number]  endoscope was introduced through the anus and advanced to the cecum, which was identified by both the appendix and ileocecal valve. No adverse events experienced. The quality of the prep was Suprep good  The instrument was then slowly withdrawn as the colon was fully examined.    COLON FINDINGS: Six sessile polyps ranging between 3-48mm in size were found in the rectosigmoid colon.  Polypectomy was performed with a cold snare (5) and using snare cautery (1).  The resection was complete and the polyp tissue was completely retrieved.   Small internal hemorrhoids were found.  Retroflexed views revealed internal hemorrhoids.              The scope was withdrawn and the procedure completed. COMPLICATIONS: There were no complications.  ENDOSCOPIC IMPRESSION: 1.   Six sessile polyps ranging between 3-80mm in size were found in the rectosigmoid colon; polypectomy was performed with a cold snare and using snare cautery 2.   Small internal hemorrhoids  RECOMMENDATIONS: 1.  Hold aspirin, aspirin products, and anti-inflammatory medication for 1 week. 2.  Await pathology results 3.  If the polyps removed today are proven to be adenomatous (pre-cancerous) polyps, you will need a colonoscopy in 3  years. Otherwise you should continue to follow colorectal cancer screening guidelines for "routine risk" patients with a colonoscopy in 10 years.  You will receive a letter within 1-2 weeks with the results of your biopsy as well as final recommendations.  Please call my office if you have not received a letter after 3 weeks.  eSigned:  Beverley Fiedler, MD 04/12/2012 11:58 AM   cc: Reuel Derby, MD and The Patient   PATIENT NAME:  Phillip, Doyle MR#: [id number]

## 2012-04-13 ENCOUNTER — Telehealth: Payer: Self-pay

## 2012-04-13 NOTE — Telephone Encounter (Signed)
  Follow up Call-  Call back number 04/12/2012  Post procedure Call Back phone  # [phone]  Permission to leave phone message Yes     Patient questions:  Do you have a fever, pain , or abdominal swelling? no Pain Score  0 *  Have you tolerated food without any problems? yes  Have you been able to return to your normal activities? yes  Do you have any questions about your discharge instructions: Diet   no Medications  no Follow up visit  no  Do you have questions or concerns about your Care? no  Actions: * If pain score is 4 or above: No action needed, pain <4.  Per the pt, "you staff is the best office I have ever been to". Maw

## 2012-04-15 ENCOUNTER — Other Ambulatory Visit: Payer: Self-pay

## 2012-04-15 MED ORDER — SITAGLIPTIN PHOSPHATE 100 MG PO TABS: 100.0000 mg | ORAL_TABLET | Freq: Every day | ORAL | Status: DC

## 2012-04-20 ENCOUNTER — Encounter: Payer: Self-pay | Admitting: Internal Medicine

## 2012-07-12 ENCOUNTER — Other Ambulatory Visit: Payer: BC Managed Care – PPO

## 2012-07-13 ENCOUNTER — Other Ambulatory Visit (INDEPENDENT_AMBULATORY_CARE_PROVIDER_SITE_OTHER): Payer: BC Managed Care – PPO

## 2012-07-13 ENCOUNTER — Other Ambulatory Visit: Payer: Self-pay

## 2012-07-13 DIAGNOSIS — I10 Essential (primary) hypertension: Secondary | ICD-10-CM

## 2012-07-13 DIAGNOSIS — E785 Hyperlipidemia, unspecified: Secondary | ICD-10-CM

## 2012-07-13 DIAGNOSIS — E119 Type 2 diabetes mellitus without complications: Secondary | ICD-10-CM

## 2012-07-13 LAB — LIPID PANEL
HDL: 45.9 mg/dL (ref >39.00)
Total CHOL/HDL Ratio: 3
Triglycerides: 107 mg/dL (ref 0.0–149.0)
VLDL: 21.4 mg/dL (ref 0.0–40.0)

## 2012-07-13 LAB — RENAL FUNCTION PANEL
Chloride: 96 mEq/L (ref 96–112)
GFR: 114.87 mL/min (ref >60.00)
Glucose, Bld: 97 mg/dL (ref 70–99)
Phosphorus: 3 mg/dL (ref 2.3–4.6)
Potassium: 3.8 mEq/L (ref 3.5–5.1)
Sodium: 134 mEq/L — ABNORMAL LOW (ref 135–145)

## 2012-07-13 LAB — HEPATIC FUNCTION PANEL
ALT: 19 U/L (ref 0–53)
Albumin: 4.2 g/dL (ref 3.5–5.2)
Bilirubin, Direct: 0 mg/dL (ref 0.0–0.3)
Total Protein: 7.3 g/dL (ref 6.0–8.3)

## 2012-07-13 LAB — CBC
Hemoglobin: 13.9 g/dL (ref 13.0–17.0)
MCHC: 32.7 g/dL (ref 30.0–36.0)
MCV: 100.2 fl — ABNORMAL HIGH (ref 78.0–100.0)
Platelets: 293 10*3/uL (ref 150.0–400.0)
RDW: 13.8 % (ref 11.5–14.6)

## 2012-07-13 MED ORDER — METOPROLOL SUCCINATE ER 200 MG PO TB24: 200.0000 mg | ORAL_TABLET | Freq: Every day | ORAL | Status: DC

## 2012-07-14 ENCOUNTER — Ambulatory Visit (INDEPENDENT_AMBULATORY_CARE_PROVIDER_SITE_OTHER): Payer: BC Managed Care – PPO | Admitting: Family Medicine

## 2012-07-14 ENCOUNTER — Encounter: Payer: Self-pay | Admitting: Family Medicine

## 2012-07-14 VITALS — BP 144/87 | HR 77 | Temp 97.9°F | Ht 69.0 in | Wt 218.0 lb

## 2012-07-14 DIAGNOSIS — I10 Essential (primary) hypertension: Secondary | ICD-10-CM

## 2012-07-14 DIAGNOSIS — Z23 Encounter for immunization: Secondary | ICD-10-CM

## 2012-07-14 DIAGNOSIS — J40 Bronchitis, not specified as acute or chronic: Secondary | ICD-10-CM

## 2012-07-14 DIAGNOSIS — F17200 Nicotine dependence, unspecified, uncomplicated: Secondary | ICD-10-CM

## 2012-07-14 HISTORY — DX: Bronchitis, not specified as acute or chronic: J40

## 2012-07-14 MED ORDER — HYDROCOD POLST-CHLORPHEN POLST 10-8 MG/5ML PO LQCR: 5.0000 mL | Freq: Every evening | ORAL | Status: DC | PRN

## 2012-07-14 MED ORDER — AMOXICILLIN-POT CLAVULANATE 875-125 MG PO TABS: 1.0000 | ORAL_TABLET | Freq: Two times a day (BID) | ORAL | Status: DC

## 2012-07-14 MED ORDER — GUAIFENESIN ER 600 MG PO TB12: 600.0000 mg | ORAL_TABLET | Freq: Two times a day (BID) | ORAL | Status: DC

## 2012-07-14 NOTE — Patient Instructions (Addendum)
Start a probiotic cap while on antibiotics, such as Digestive Advantage or Align or a generic   Bronchitis Bronchitis is the body's way of reacting to injury and/or infection (inflammation) of the bronchi. Bronchi are the air tubes that extend from the windpipe into the lungs. If the inflammation becomes severe, it may cause shortness of breath. CAUSES  Inflammation may be caused by:  A virus.  Germs (bacteria).  Dust.  Allergens.  Pollutants and many other irritants. The cells lining the bronchial tree are covered with tiny hairs (cilia). These constantly beat upward, away from the lungs, toward the mouth. This keeps the lungs free of pollutants. When these cells become too irritated and are unable to do their job, mucus begins to develop. This causes the characteristic cough of bronchitis. The cough clears the lungs when the cilia are unable to do their job. Without either of these protective mechanisms, the mucus would settle in the lungs. Then you would develop pneumonia. Smoking is a common cause of bronchitis and can contribute to pneumonia. Stopping this habit is the single most important thing you can do to help yourself. TREATMENT   Your caregiver may prescribe an antibiotic if the cough is caused by bacteria. Also, medicines that open up your airways make it easier to breathe. Your caregiver may also recommend or prescribe an expectorant. It will loosen the mucus to be coughed up. Only take over-the-counter or prescription medicines for pain, discomfort, or fever as directed by your caregiver.  Removing whatever causes the problem (smoking, for example) is critical to preventing the problem from getting worse.  Cough suppressants may be prescribed for relief of cough symptoms.  Inhaled medicines may be prescribed to help with symptoms now and to help prevent problems from returning.  For those with recurrent (chronic) bronchitis, there may be a need for steroid medicines. SEEK  IMMEDIATE MEDICAL CARE IF:   During treatment, you develop more pus-like mucus (purulent sputum).  You have a fever.  Your baby is older than 3 months with a rectal temperature of 102 F (38.9 C) or higher.  Your baby is 56 months old or younger with a rectal temperature of 100.4 F (38 C) or higher.  You become progressively more ill.  You have increased difficulty breathing, wheezing, or shortness of breath. It is necessary to seek immediate medical care if you are elderly or sick from any other disease. MAKE SURE YOU:   Understand these instructions.  Will watch your condition.  Will get help right away if you are not doing well or get worse. Document Released: 08/04/2005 Document Revised: 10/27/2011 Document Reviewed: 06/13/2008 [facility] Patient Information 2013 [place], [place].

## 2012-07-14 NOTE — Assessment & Plan Note (Signed)
Encouraged increased rest and fluids. Start Mucinex 600 mg twice a day. Augmentin 875 twice a day is also prescribed. Encouraged probiotic as well report if no improvement.

## 2012-07-14 NOTE — Assessment & Plan Note (Signed)
Up slightly secondary to acute illness and affect is been taking products containing Sudafed. He is counseled that he should not take Sudafed or dextromethorphan products and expresses understanding will recheck his blood pressure at his upcoming visit.

## 2012-07-14 NOTE — Assessment & Plan Note (Signed)
Had been off the cigarettes completely so that a month ago when he started smoking one to 2 cigarettes daily. Counseled at length regarding the need to quit once again.

## 2012-07-14 NOTE — Progress Notes (Signed)
Patient ID: Phillip Doyle, male   DOB: November 24, 1955, 56 y.o.   MRN: [id number] EMMERSON TADDEI [id number] 08-Jun-1956 07/14/2012      Progress Note-Follow Up  Subjective  Chief Complaint  Chief Complaint  Patient presents with  . URI    chest congestion, cough w/phlegm (yellowish) X 1 week    HPI  Patient is a 56 year old Caucasian male who is in today with a one-week history of worsening congestion. He had been smoke free to 11 months ago when he started to smoke one to 2 cigarettes a day. I week the details and chest congestion which is now causing a significant cough keeping him up at night. The cough is productive of yellow phlegm. He hasn't sinus pressure and congestion as well as some rhinorrhea and postnasal drip. Is struggling with malaise and myalgias. Low-grade frontal headache and some wheezing also noted symptoms are worse when he lies down at night. He denies fevers or chills. Denies ear pain GI or GU symptoms. Has been using over-the-counter products including Tylenol Cold and sinus and NyQuil. Get some relief from these  Past Medical History  Diagnosis Date  . Allergy     rhinitis  . Hypertension   . Obesity   . Ulcer 1982    peptic ulcer disease  . Impaired glucose tolerance   . Diabetes mellitus   . TOBACCO ABUSE 07/04/2010  . PEPTIC ULCER DISEASE 02/15/2007  . NISSEN FUNDOPLICATION, HX OF 02/15/2007  . Mixed hyperlipidemia 07/04/2010  . LUMBAR STRAIN 10/17/2010  . HYPERTENSION 02/15/2007  . EXOGENOUS OBESITY 02/15/2007  . DM 10/24/2010  . ALLERGIC RHINITIS 02/15/2007  . HTN (hypertension) 08/20/2011  . OA (osteoarthritis) of knee 02/17/2012  . ED (erectile dysfunction) 02/17/2012  . Bronchitis 07/14/2012    Past Surgical History  Procedure Date  . Rotator cuff repair 2000  . Vasectomy 1982  . Fundiplication for hh and reflux 1987  . Negative stress test 2008    Family History  Problem Relation Age of Onset  . Asthma Mother   . Cancer Mother     uterine   . Alcohol  abuse Father   . Stroke Maternal Grandmother     possibly  . Hypertension Maternal Grandfather   . Hypertension Brother   . Cancer Brother     prostate  . Colon cancer Neg Hx     History   Social History  . Marital Status: Married    Spouse Name: [name]    Number of Children: N/A  . Years of Education: N/A   Occupational History  .  [employer]   Social History Main Topics  . Smoking status: Current Every Day Smoker    Types: Cigarettes  . Smokeless tobacco: Former Neurosurgeon    Quit date: 03/18/2012     Comment: quit 06/18/10- started back   . Alcohol Use: 0.0 oz/week    3-5 Cans of beer per week     Comment: 3-5 beer daily  . Drug Use: No  . Sexually Active: Yes -- Male partner(s)   Other Topics Concern  . Not on file   Social History Narrative  . No narrative on file    Current Outpatient Prescriptions on File Prior to Visit  Medication Sig Dispense Refill  . Acetaminophen (TYLENOL ARTHRITIS PAIN PO) Take by mouth as needed.        . cyclobenzaprine (FLEXERIL) 10 MG tablet Take 1 tablet (10 mg total) by mouth 3 (three) times daily as needed for muscle  spasms.  40 tablet  1  . fish oil-omega-3 fatty acids 1000 MG capsule Take 2 g by mouth daily.        Marland Kitchen glucose blood test strip 1 each by Other route 2 (two) times daily. Diag. Code 250.00  100 each  1  . Lancets (FREESTYLE) lancets 1 each by Other route 2 (two) times daily. diag code 250.00  100 each  1  . lisinopril-hydrochlorothiazide (PRINZIDE,ZESTORETIC) 20-25 MG per tablet Take 1 tablet by mouth daily.  90 tablet  2  . metFORMIN (GLUCOPHAGE) 1000 MG tablet Take 1 tablet (1,000 mg total) by mouth 2 (two) times daily with a meal.  180 tablet  3  . metoprolol (TOPROL-XL) 200 MG 24 hr tablet Take 1 tablet (200 mg total) by mouth daily.  30 tablet  0  . Multiple Vitamins-Minerals (PA MENS 50 PLUS VITAPAK PO) Take 1 capsule by mouth daily.      . NON FORMULARY Freestyle Lancets misc- two times a day       . NON FORMULARY  Freestyle test strips- bid        . sildenafil (VIAGRA) 100 MG tablet Take 0.5-1 tablets (50-100 mg total) by mouth daily as needed for erectile dysfunction.  10 tablet  3  . sitaGLIPtin (JANUVIA) 100 MG tablet Take 1 tablet (100 mg total) by mouth daily.  30 tablet  3    No Known Allergies  Review of Systems  Review of Systems  Constitutional: Positive for malaise/fatigue. Negative for fever and chills.  HENT: Positive for congestion.   Eyes: Negative for discharge.  Respiratory: Positive for sputum production and wheezing. Negative for shortness of breath.   Cardiovascular: Negative for chest pain, palpitations and leg swelling.  Gastrointestinal: Negative for nausea, abdominal pain and diarrhea.  Genitourinary: Negative for dysuria.  Musculoskeletal: Negative for falls.  Skin: Negative for rash.  Neurological: Positive for headaches. Negative for loss of consciousness.  Endo/Heme/Allergies: Negative for polydipsia.  Psychiatric/Behavioral: Negative for depression and suicidal ideas. The patient is not nervous/anxious and does not have insomnia.     Objective  BP 144/87  Pulse 77  Temp 97.9 F (36.6 C) (Temporal)  Ht 5\' 9"  (1.753 m)  Wt 218 lb (98.884 kg)  BMI 32.19 kg/m2  SpO2 97%  Physical Exam  Physical Exam  Constitutional: He is oriented to person, place, and time and well-developed, well-nourished, and in no distress. No distress.  HENT:  Head: Normocephalic and atraumatic.  Eyes: Conjunctivae normal are normal.  Neck: Neck supple. No thyromegaly present.  Cardiovascular: Normal rate, regular rhythm and normal heart sounds.   No murmur heard. Pulmonary/Chest: Effort normal and breath sounds normal. No respiratory distress.  Abdominal: He exhibits no distension and no mass. There is no tenderness.  Musculoskeletal: He exhibits no edema.  Neurological: He is alert and oriented to person, place, and time.  Skin: Skin is warm.  Psychiatric: Memory, affect and  judgment normal.    Lab Results  Component Value Date   TSH 0.40 07/13/2012   Lab Results  Component Value Date   WBC 8.5 07/13/2012   HGB 13.9 07/13/2012   HCT 42.7 07/13/2012   MCV 100.2* 07/13/2012   PLT 293.0 07/13/2012   Lab Results  Component Value Date   CREATININE 0.8 07/13/2012   BUN 10 07/13/2012   NA 134* 07/13/2012   K 3.8 07/13/2012   CL 96 07/13/2012   CO2 30 07/13/2012   Lab Results  Component Value Date  ALT 19 07/13/2012   AST 15 07/13/2012   ALKPHOS 52 07/13/2012   BILITOT 0.5 07/13/2012   Lab Results  Component Value Date   CHOL 148 07/13/2012   Lab Results  Component Value Date   HDL 45.90 07/13/2012   Lab Results  Component Value Date   LDLCALC 81 07/13/2012   Lab Results  Component Value Date   TRIG 107.0 07/13/2012   Lab Results  Component Value Date   CHOLHDL 3 07/13/2012     Assessment & Plan  HTN (hypertension) Up slightly secondary to acute illness and affect is been taking products containing Sudafed. He is counseled that he should not take Sudafed or dextromethorphan products and expresses understanding will recheck his blood pressure at his upcoming visit.  TOBACCO ABUSE Had been off the cigarettes completely so that a month ago when he started smoking one to 2 cigarettes daily. Counseled at length regarding the need to quit once again.  Bronchitis Encouraged increased rest and fluids. Start Mucinex 600 mg twice a day. Augmentin 875 twice a day is also prescribed. Encouraged probiotic as well report if no improvement.

## 2012-07-19 ENCOUNTER — Ambulatory Visit (INDEPENDENT_AMBULATORY_CARE_PROVIDER_SITE_OTHER): Payer: BC Managed Care – PPO | Admitting: Family Medicine

## 2012-07-19 ENCOUNTER — Encounter: Payer: Self-pay | Admitting: Family Medicine

## 2012-07-19 VITALS — BP 118/75 | HR 88 | Temp 98.5°F | Ht 69.0 in | Wt 217.0 lb

## 2012-07-19 DIAGNOSIS — E782 Mixed hyperlipidemia: Secondary | ICD-10-CM

## 2012-07-19 DIAGNOSIS — Z7185 Encounter for immunization safety counseling: Secondary | ICD-10-CM | POA: Insufficient documentation

## 2012-07-19 DIAGNOSIS — J309 Allergic rhinitis, unspecified: Secondary | ICD-10-CM

## 2012-07-19 DIAGNOSIS — I10 Essential (primary) hypertension: Secondary | ICD-10-CM

## 2012-07-19 DIAGNOSIS — J40 Bronchitis, not specified as acute or chronic: Secondary | ICD-10-CM

## 2012-07-19 DIAGNOSIS — E119 Type 2 diabetes mellitus without complications: Secondary | ICD-10-CM

## 2012-07-19 DIAGNOSIS — Z0000 Encounter for general adult medical examination without abnormal findings: Secondary | ICD-10-CM

## 2012-07-19 HISTORY — DX: Encounter for general adult medical examination without abnormal findings: Z00.00

## 2012-07-19 NOTE — Assessment & Plan Note (Signed)
Seasonal  Questionably, infrequent

## 2012-07-19 NOTE — Assessment & Plan Note (Signed)
Foot exam performed today, dry skin and some onychomycosis but no other concerns. Sugars are doing great. Has annual exam scheduled with optometry next month. No changes

## 2012-07-19 NOTE — Assessment & Plan Note (Signed)
Well controlled 

## 2012-07-19 NOTE — Assessment & Plan Note (Signed)
Well controlled, no changes 

## 2012-07-19 NOTE — Assessment & Plan Note (Signed)
Improving with medications, no changes.

## 2012-07-19 NOTE — Patient Instructions (Addendum)
Preventive Care for Adults, Male A healthy lifestyle and preventive care can promote health and wellness. Preventive health guidelines for men include the following key practices:  A routine yearly physical is a good way to check with your caregiver about your health and preventative screening. It is a chance to share any concerns and updates on your health, and to receive a thorough exam.  Visit your dentist for a routine exam and preventative care every 6 months. Brush your teeth twice a day and floss once a day. Good oral hygiene prevents tooth decay and gum disease.  The frequency of eye exams is based on your age, health, family medical history, use of contact lenses, and other factors. Follow your caregiver's recommendations for frequency of eye exams.  Eat a healthy diet. Foods like vegetables, fruits, whole grains, low-fat dairy products, and lean protein foods contain the nutrients you need without too many calories. Decrease your intake of foods high in solid fats, added sugars, and salt. Eat the right amount of calories for you.Get information about a proper diet from your caregiver, if necessary.  Regular physical exercise is one of the most important things you can do for your health. Most adults should get at least 150 minutes of moderate-intensity exercise (any activity that increases your heart rate and causes you to sweat) each week. In addition, most adults need muscle-strengthening exercises on 2 or more days a week.  Maintain a healthy weight. The body mass index (BMI) is a screening tool to identify possible weight problems. It provides an estimate of body fat based on height and weight. Your caregiver can help determine your BMI, and can help you achieve or maintain a healthy weight.For adults 20 years and older:  A BMI below 18.5 is considered underweight.  A BMI of 18.5 to 24.9 is normal.  A BMI of 25 to 29.9 is considered overweight.  A BMI of 30 and above is  considered obese.  Maintain normal blood lipids and cholesterol levels by exercising and minimizing your intake of saturated fat. Eat a balanced diet with plenty of fruit and vegetables. Blood tests for lipids and cholesterol should begin at age 20 and be repeated every 5 years. If your lipid or cholesterol levels are high, you are over 50, or you are a high risk for heart disease, you may need your cholesterol levels checked more frequently.Ongoing high lipid and cholesterol levels should be treated with medicines if diet and exercise are not effective.  If you smoke, find out from your caregiver how to quit. If you do not use tobacco, do not start.  If you choose to drink alcohol, do not exceed 2 drinks per day. One drink is considered to be 12 ounces (355 mL) of beer, 5 ounces (148 mL) of wine, or 1.5 ounces (44 mL) of liquor.  Avoid use of street drugs. Do not share needles with anyone. Ask for help if you need support or instructions about stopping the use of drugs.  High blood pressure causes heart disease and increases the risk of stroke. Your blood pressure should be checked at least every 1 to 2 years. Ongoing high blood pressure should be treated with medicines, if weight loss and exercise are not effective.  If you are 45 to 55 years old, ask your caregiver if you should take aspirin to prevent heart disease.  Diabetes screening involves taking a blood sample to check your fasting blood sugar level. This should be done once every 3 years,   after age 45, if you are within normal weight and without risk factors for diabetes. Testing should be considered at a younger age or be carried out more frequently if you are overweight and have at least 1 risk factor for diabetes.  Colorectal cancer can be detected and often prevented. Most routine colorectal cancer screening begins at the age of 50 and continues through age 75. However, your caregiver may recommend screening at an earlier age if you  have risk factors for colon cancer. On a yearly basis, your caregiver may provide home test kits to check for hidden blood in the stool. Use of a small camera at the end of a tube, to directly examine the colon (sigmoidoscopy or colonoscopy), can detect the earliest forms of colorectal cancer. Talk to your caregiver about this at age 50, when routine screening begins. Direct examination of the colon should be repeated every 5 to 10 years through age 75, unless early forms of pre-cancerous polyps or small growths are found.  Hepatitis C blood testing is recommended for all people born from 1945 through 1965 and any individual with known risks for hepatitis C.  Practice safe sex. Use condoms and avoid high-risk sexual practices to reduce the spread of sexually transmitted infections (STIs). STIs include gonorrhea, chlamydia, syphilis, trichomonas, herpes, HPV, and human immunodeficiency virus (HIV). Herpes, HIV, and HPV are viral illnesses that have no cure. They can result in disability, cancer, and death.  A one-time screening for abdominal aortic aneurysm (AAA) and surgical repair of large AAAs by sound wave imaging (ultrasonography) is recommended for ages 65 to 75 years who are current or former smokers.  Healthy men should no longer receive prostate-specific antigen (PSA) blood tests as part of routine cancer screening. Consult with your caregiver about prostate cancer screening.  Testicular cancer screening is not recommended for adult males who have no symptoms. Screening includes self-exam, caregiver exam, and other screening tests. Consult with your caregiver about any symptoms you have or any concerns you have about testicular cancer.  Use sunscreen with skin protection factor (SPF) of 30 or more. Apply sunscreen liberally and repeatedly throughout the day. You should seek shade when your shadow is shorter than you. Protect yourself by wearing long sleeves, pants, a wide-brimmed hat, and  sunglasses year round, whenever you are outdoors.  Once a month, do a whole body skin exam, using a mirror to look at the skin on your back. Notify your caregiver of new moles, moles that have irregular borders, moles that are larger than a pencil eraser, or moles that have changed in shape or color.  Stay current with required immunizations.  Influenza. You need a dose every fall (or winter). The composition of the flu vaccine changes each year, so being vaccinated once is not enough.  Pneumococcal polysaccharide. You need 1 to 2 doses if you smoke cigarettes or if you have certain chronic medical conditions. You need 1 dose at age 65 (or older) if you have never been vaccinated.  Tetanus, diphtheria, pertussis (Tdap, Td). Get 1 dose of Tdap vaccine if you are younger than age 65 years, are over 65 and have contact with an infant, are a healthcare worker, or simply want to be protected from whooping cough. After that, you need a Td booster dose every 10 years. Consult your caregiver if you have not had at least 3 tetanus and diphtheria-containing shots sometime in your life or have a deep or dirty wound.  HPV. This vaccine is recommended   for males 13 through 55 years of age. This vaccine may be given to men 22 through 55 years of age who have not completed the 3 dose series. It is recommended for men through age 26 who have sex with men or whose immune system is weakened because of HIV infection, other illness, or medications. The vaccine is given in 3 doses over 6 months.  Measles, mumps, rubella (MMR). You need at least 1 dose of MMR if you were born in 1957 or later. You may also need a 2nd dose.  Meningococcal. If you are age 19 to 21 years and a first-year college student living in a residence hall, or have one of several medical conditions, you need to get vaccinated against meningococcal disease. You may also need additional booster doses.  Zoster (shingles). If you are age 60 years or  older, you should get this vaccine.  Varicella (chickenpox). If you have never had chickenpox or you were vaccinated but received only 1 dose, talk to your caregiver to find out if you need this vaccine.  Hepatitis A. You need this vaccine if you have a specific risk factor for hepatitis A virus infection, or you simply wish to be protected from this disease. The vaccine is usually given as 2 doses, 6 to 18 months apart.  Hepatitis B. You need this vaccine if you have a specific risk factor for hepatitis B virus infection or you simply wish to be protected from this disease. The vaccine is given in 3 doses, usually over 6 months. Preventative Service / Frequency Ages 19 to 39  Blood pressure check.** / Every 1 to 2 years.  Lipid and cholesterol check.** / Every 5 years beginning at age 20.  Hepatitis C blood test.** / For any individual with known risks for hepatitis C.  Skin self-exam. / Monthly.  Influenza immunization.** / Every year.  Pneumococcal polysaccharide immunization.** / 1 to 2 doses if you smoke cigarettes or if you have certain chronic medical conditions.  Tetanus, diphtheria, pertussis (Tdap,Td) immunization. / A one-time dose of Tdap vaccine. After that, you need a Td booster dose every 10 years.  HPV immunization. / 3 doses over 6 months, if 26 and younger.  Measles, mumps, rubella (MMR) immunization. / You need at least 1 dose of MMR if you were born in 1957 or later. You may also need a 2nd dose.  Meningococcal immunization. / 1 dose if you are age 19 to 21 years and a first-year college student living in a residence hall, or have one of several medical conditions, you need to get vaccinated against meningococcal disease. You may also need additional booster doses.  Varicella immunization.** / Consult your caregiver.  Hepatitis A immunization.** / Consult your caregiver. 2 doses, 6 to 18 months apart.  Hepatitis B immunization.** / Consult your caregiver. 3 doses  usually over 6 months. Ages 40 to 64  Blood pressure check.** / Every 1 to 2 years.  Lipid and cholesterol check.** / Every 5 years beginning at age 20.  Fecal occult blood test (FOBT) of stool. / Every year beginning at age 50 and continuing until age 75. You may not have to do this test if you get colonoscopy every 10 years.  Flexible sigmoidoscopy** or colonoscopy.** / Every 5 years for a flexible sigmoidoscopy or every 10 years for a colonoscopy beginning at age 50 and continuing until age 75.  Hepatitis C blood test.** / For all people born from 1945 through 1965 and any   individual with known risks for hepatitis C.  Skin self-exam. / Monthly.  Influenza immunization.** / Every year.  Pneumococcal polysaccharide immunization.** / 1 to 2 doses if you smoke cigarettes or if you have certain chronic medical conditions.  Tetanus, diphtheria, pertussis (Tdap/Td) immunization.** / A one-time dose of Tdap vaccine. After that, you need a Td booster dose every 10 years.  Measles, mumps, rubella (MMR) immunization. / You need at least 1 dose of MMR if you were born in 1957 or later. You may also need a 2nd dose.  Varicella immunization.**/ Consult your caregiver.  Meningococcal immunization.** / Consult your caregiver.  Hepatitis A immunization.** / Consult your caregiver. 2 doses, 6 to 18 months apart.  Hepatitis B immunization.** / Consult your caregiver. 3 doses, usually over 6 months. Ages 65 and over  Blood pressure check.** / Every 1 to 2 years.  Lipid and cholesterol check.**/ Every 5 years beginning at age 20.  Fecal occult blood test (FOBT) of stool. / Every year beginning at age 50 and continuing until age 75. You may not have to do this test if you get colonoscopy every 10 years.  Flexible sigmoidoscopy** or colonoscopy.** / Every 5 years for a flexible sigmoidoscopy or every 10 years for a colonoscopy beginning at age 50 and continuing until age 75.  Hepatitis C blood  test.** / For all people born from 1945 through 1965 and any individual with known risks for hepatitis C.  Abdominal aortic aneurysm (AAA) screening.** / A one-time screening for ages 65 to 75 years who are current or former smokers.  Skin self-exam. / Monthly.  Influenza immunization.** / Every year.  Pneumococcal polysaccharide immunization.** / 1 dose at age 65 (or older) if you have never been vaccinated.  Tetanus, diphtheria, pertussis (Tdap, Td) immunization. / A one-time dose of Tdap vaccine if you are over 65 and have contact with an infant, are a healthcare worker, or simply want to be protected from whooping cough. After that, you need a Td booster dose every 10 years.  Varicella immunization. ** / Consult your caregiver.  Meningococcal immunization.** / Consult your caregiver.  Hepatitis A immunization. ** / Consult your caregiver. 2 doses, 6 to 18 months apart.  Hepatitis B immunization.** / Check with your caregiver. 3 doses, usually over 6 months. **Family history and personal history of risk and conditions may change your caregiver's recommendations. Document Released: 09/30/2001 Document Revised: 10/27/2011 Document Reviewed: 12/30/2010 [employer] Patient Information 2013 [employer].  

## 2012-07-19 NOTE — Assessment & Plan Note (Signed)
Has had colonoscopy, shots up to date. Discussed need for heart healthy diet and increased exercise. Only one cigarette since last weeks appt. Encouraged complete cessation

## 2012-07-19 NOTE — Progress Notes (Signed)
Patient ID: Phillip Doyle, male   DOB: [birth year], 56 y.o.   MRN: [id number] JAMINE HIGHFILL [id number] 08/26/1955 07/19/2012      Progress Note New Patient  Subjective  Chief Complaint  Chief Complaint  Patient presents with  . Annual Exam    physical    HPI  Patient is a 56 year old male in today for annual exam. Overall he feels well. He was then last week with a bad bronchitis but notes he is 90% improved. He still gets a cough but not overwhelming. Nonproductive and no fevers are noted. He has smoked only one cigarette since his last visit and reports she will continue to try to completely quit. No other recent acute complaints. He proceeded with his colonoscopy back in August and was pleased with his care. No other acute complaints. Notes his sugars have been well-controlled his energy level continues to be better. He denies any exercise and is frustrated about his weight gain. Continues to trainee smaller meals with less carbohydrates. No chest pain, palpitations, shortness of breath, back pain, GI or GU complaints noted today.  Past Medical History  Diagnosis Date  . Allergy     rhinitis  . Hypertension   . Obesity   . Ulcer 1982    peptic ulcer disease  . Impaired glucose tolerance   . Diabetes mellitus   . TOBACCO ABUSE 07/04/2010  . PEPTIC ULCER DISEASE 02/15/2007  . NISSEN FUNDOPLICATION, HX OF 02/15/2007  . Mixed hyperlipidemia 07/04/2010  . LUMBAR STRAIN 10/17/2010  . HYPERTENSION 02/15/2007  . EXOGENOUS OBESITY 02/15/2007  . DM 10/24/2010  . ALLERGIC RHINITIS 02/15/2007  . HTN (hypertension) 08/20/2011  . OA (osteoarthritis) of knee 02/17/2012  . ED (erectile dysfunction) 02/17/2012  . Bronchitis 07/14/2012  . Preventative health care 07/19/2012    Past Surgical History  Procedure Date  . Rotator cuff repair 2000  . Vasectomy 1982  . Fundiplication for hh and reflux 1987  . Negative stress test 2008    Family History  Problem Relation Age of Onset  . Asthma Mother   .  Cancer Mother     uterine   . Alcohol abuse Father   . Stroke Maternal Grandmother     possibly  . Hypertension Maternal Grandfather   . Hypertension Brother   . Cancer Brother     prostate  . Colon cancer Neg Hx     History   Social History  . Marital Status: Married    Spouse Name: [name]    Number of Children: N/A  . Years of Education: N/A   Occupational History  .  [employer]   Social History Main Topics  . Smoking status: Current Every Day Smoker    Types: Cigarettes  . Smokeless tobacco: Former Neurosurgeon    Quit date: 03/18/2012     Comment: quit 06/18/10- started back   . Alcohol Use: 0.0 oz/week    3-5 Cans of beer per week     Comment: 3-5 beer daily  . Drug Use: No  . Sexually Active: Yes -- Male partner(s)   Other Topics Concern  . Not on file   Social History Narrative  . No narrative on file    Current Outpatient Prescriptions on File Prior to Visit  Medication Sig Dispense Refill  . Acetaminophen (TYLENOL ARTHRITIS PAIN PO) Take by mouth as needed.        Marland Kitchen amoxicillin-clavulanate (AUGMENTIN) 875-125 MG per tablet Take 1 tablet by mouth 2 (two) times daily.  20 tablet  0  . chlorpheniramine-HYDROcodone (TUSSIONEX PENNKINETIC ER) 10-8 MG/5ML LQCR Take 5 mLs by mouth at bedtime as needed.  140 mL  1  . cyclobenzaprine (FLEXERIL) 10 MG tablet Take 1 tablet (10 mg total) by mouth 3 (three) times daily as needed for muscle spasms.  40 tablet  1  . fish oil-omega-3 fatty acids 1000 MG capsule Take 2 g by mouth daily.        Marland Kitchen glucose blood test strip 1 each by Other route 2 (two) times daily. Diag. Code 250.00  100 each  1  . guaiFENesin (MUCINEX) 600 MG 12 hr tablet Take 1 tablet (600 mg total) by mouth 2 (two) times daily.  20 tablet  0  . Lancets (FREESTYLE) lancets 1 each by Other route 2 (two) times daily. diag code 250.00  100 each  1  . lisinopril-hydrochlorothiazide (PRINZIDE,ZESTORETIC) 20-25 MG per tablet Take 1 tablet by mouth daily.  90 tablet  2  .  metFORMIN (GLUCOPHAGE) 1000 MG tablet Take 1 tablet (1,000 mg total) by mouth 2 (two) times daily with a meal.  180 tablet  3  . metoprolol (TOPROL-XL) 200 MG 24 hr tablet Take 1 tablet (200 mg total) by mouth daily.  30 tablet  0  . Multiple Vitamins-Minerals (PA MENS 50 PLUS VITAPAK PO) Take 1 capsule by mouth daily.      . NON FORMULARY Freestyle Lancets misc- two times a day       . NON FORMULARY Freestyle test strips- bid        . sildenafil (VIAGRA) 100 MG tablet Take 0.5-1 tablets (50-100 mg total) by mouth daily as needed for erectile dysfunction.  10 tablet  3  . sitaGLIPtin (JANUVIA) 100 MG tablet Take 1 tablet (100 mg total) by mouth daily.  30 tablet  3    No Known Allergies  Review of Systems  Review of Systems  Constitutional: Negative for fever, chills and malaise/fatigue.  HENT: Positive for congestion. Negative for hearing loss and nosebleeds.   Eyes: Negative for discharge.  Respiratory: Positive for cough. Negative for sputum production, shortness of breath and wheezing.   Cardiovascular: Negative for chest pain, palpitations and leg swelling.  Gastrointestinal: Negative for heartburn, nausea, vomiting, abdominal pain, diarrhea, constipation and blood in stool.  Genitourinary: Negative for dysuria, urgency, frequency and hematuria.  Musculoskeletal: Negative for myalgias, back pain and falls.  Skin: Negative for rash.  Neurological: Negative for dizziness, tremors, sensory change, focal weakness, loss of consciousness, weakness and headaches.  Endo/Heme/Allergies: Negative for polydipsia. Does not bruise/bleed easily.  Psychiatric/Behavioral: Negative for depression and suicidal ideas. The patient is not nervous/anxious and does not have insomnia.     Objective  BP 118/75  Pulse 88  Temp 98.5 F (36.9 C) (Temporal)  Ht 5\' 9"  (1.753 m)  Wt 217 lb (98.431 kg)  BMI 32.05 kg/m2  SpO2 97%  Physical Exam  Physical Exam  Constitutional: He is oriented to person,  place, and time and well-developed, well-nourished, and in no distress. No distress.  HENT:  Head: Normocephalic and atraumatic.  Eyes: Conjunctivae normal are normal.  Neck: Neck supple. No thyromegaly present.  Cardiovascular: Normal rate, regular rhythm and normal heart sounds.   No murmur heard. Pulmonary/Chest: Effort normal and breath sounds normal. No respiratory distress.  Abdominal: He exhibits no distension and no mass. There is no tenderness.  Musculoskeletal: He exhibits no edema.  Neurological: He is alert and oriented to person, place, and time.  Skin: Skin is warm.  Psychiatric: Memory, affect and judgment normal.       Assessment & Plan  Bronchitis Improving with medications, no changes.   ALLERGIC RHINITIS Seasonal  Questionably, infrequent   DM Foot exam performed today, dry skin and some onychomycosis but no other concerns. Sugars are doing great. Has annual exam scheduled with optometry next month. No changes  HTN (hypertension) Well controlled, no changes  Preventative health care Has had colonoscopy, shots up to date. Discussed need for heart healthy diet and increased exercise. Only one cigarette since last weeks appt. Encouraged complete cessation  MIXED HYPERLIPIDEMIA Well controlled

## 2012-08-09 ENCOUNTER — Other Ambulatory Visit: Payer: Self-pay | Admitting: Family Medicine

## 2012-08-16 ENCOUNTER — Other Ambulatory Visit: Payer: Self-pay | Admitting: Family Medicine

## 2012-09-16 ENCOUNTER — Other Ambulatory Visit: Payer: Self-pay | Admitting: Family Medicine

## 2012-10-08 ENCOUNTER — Other Ambulatory Visit: Payer: Self-pay | Admitting: Family Medicine

## 2012-10-13 ENCOUNTER — Other Ambulatory Visit: Payer: Self-pay | Admitting: Family Medicine

## 2012-11-17 ENCOUNTER — Ambulatory Visit (INDEPENDENT_AMBULATORY_CARE_PROVIDER_SITE_OTHER): Payer: BC Managed Care – PPO | Admitting: Family Medicine

## 2012-11-17 ENCOUNTER — Encounter: Payer: Self-pay | Admitting: Family Medicine

## 2012-11-17 VITALS — BP 130/76 | HR 119 | Temp 98.2°F | Ht 69.0 in | Wt 224.0 lb

## 2012-11-17 DIAGNOSIS — A088 Other specified intestinal infections: Secondary | ICD-10-CM

## 2012-11-17 DIAGNOSIS — A084 Viral intestinal infection, unspecified: Secondary | ICD-10-CM | POA: Insufficient documentation

## 2012-11-17 DIAGNOSIS — L259 Unspecified contact dermatitis, unspecified cause: Secondary | ICD-10-CM

## 2012-11-17 DIAGNOSIS — L309 Dermatitis, unspecified: Secondary | ICD-10-CM | POA: Insufficient documentation

## 2012-11-17 MED ORDER — FLUTICASONE PROPIONATE 0.05 % EX CREA: TOPICAL_CREAM | CUTANEOUS | Status: DC

## 2012-11-17 MED ORDER — HYOSCYAMINE SULFATE 0.125 MG SL SUBL: SUBLINGUAL_TABLET | SUBLINGUAL | Status: DC

## 2012-11-17 MED ORDER — PROMETHAZINE HCL 12.5 MG PO TABS: 12.5000 mg | ORAL_TABLET | Freq: Three times a day (TID) | ORAL | Status: DC | PRN

## 2012-11-17 NOTE — Assessment & Plan Note (Signed)
Generic cutivate 0.05% cream bid prn to LE rash.

## 2012-11-17 NOTE — Progress Notes (Signed)
OFFICE NOTE  11/17/2012  CC:  Chief Complaint  Patient presents with  . Abdominal Pain    radiates to back and down legs; nausea and diarrhea last night and this AM     HPI: Patient is a 57 y.o. Caucasian male who is here with his wife for nausea, abd pains, diarrhea. Was feeling normal/fine yesterday and then after he ate supper last evening he felt onset of abdominal pain--constant but with periods of cramping/worse intensity--diffusely, extends all around both sides and in low back diffusely.  Nausea but no vomiting.  Then he began to have frequent loose BMs, no blood or mucous noted. No fevers.  No others who ate same food last night have any similar symptoms. He has been able to tolerate some sips of water and a few crackers but hasn't tried anything more.  No dizziness.  Pertinent PMH:  Past Medical History  Diagnosis Date  . Allergy     rhinitis  . Hypertension   . Obesity   . Ulcer 1982    peptic ulcer disease  . Impaired glucose tolerance   . Diabetes mellitus   . TOBACCO ABUSE 07/04/2010  . PEPTIC ULCER DISEASE 02/15/2007  . NISSEN FUNDOPLICATION, HX OF 02/15/2007  . Mixed hyperlipidemia 07/04/2010  . LUMBAR STRAIN 10/17/2010  . HYPERTENSION 02/15/2007  . EXOGENOUS OBESITY 02/15/2007  . DM 10/24/2010  . ALLERGIC RHINITIS 02/15/2007  . HTN (hypertension) 08/20/2011  . OA (osteoarthritis) of knee 02/17/2012  . ED (erectile dysfunction) 02/17/2012  . Bronchitis 07/14/2012  . Preventative health care 07/19/2012    MEDS:  Outpatient Prescriptions Prior to Visit  Medication Sig Dispense Refill  . chlorpheniramine-HYDROcodone (TUSSIONEX PENNKINETIC ER) 10-8 MG/5ML LQCR Take 5 mLs by mouth at bedtime as needed.  140 mL  1  . cyclobenzaprine (FLEXERIL) 10 MG tablet Take 1 tablet (10 mg total) by mouth 3 (three) times daily as needed for muscle spasms.  40 tablet  1  . fish oil-omega-3 fatty acids 1000 MG capsule Take 2 g by mouth daily.        Marland Kitchen glucose blood test strip 1 each by  Other route 2 (two) times daily. Diag. Code 250.00  100 each  1  . JANUVIA 100 MG tablet TAKE 1 TABLET EVERY DAY  30 tablet  3  . Lancets (FREESTYLE) lancets 1 each by Other route 2 (two) times daily. diag code 250.00  100 each  1  . lisinopril-hydrochlorothiazide (PRINZIDE,ZESTORETIC) 20-25 MG per tablet TAKE 1 TABLET BY MOUTH EVERY DAY  90 tablet  0  . metFORMIN (GLUCOPHAGE) 1000 MG tablet TAKE 1 TABLET BY MOUTH TWICE A DAY WITH A MEAL  180 tablet  3  . metoprolol (TOPROL-XL) 200 MG 24 hr tablet TAKE 1 TABLET BY MOUTH DAILY  90 tablet  0  . Multiple Vitamins-Minerals (PA MENS 50 PLUS VITAPAK PO) Take 1 capsule by mouth daily.      . sildenafil (VIAGRA) 100 MG tablet Take 0.5-1 tablets (50-100 mg total) by mouth daily as needed for erectile dysfunction.  10 tablet  3  . Acetaminophen (TYLENOL ARTHRITIS PAIN PO) Take by mouth as needed.        Marland Kitchen amoxicillin-clavulanate (AUGMENTIN) 875-125 MG per tablet Take 1 tablet by mouth 2 (two) times daily.  20 tablet  0  . guaiFENesin (MUCINEX) 600 MG 12 hr tablet Take 1 tablet (600 mg total) by mouth 2 (two) times daily.  20 tablet  0  . metFORMIN (GLUCOPHAGE) 1000 MG tablet  Take 1 tablet (1,000 mg total) by mouth 2 (two) times daily with a meal.  180 tablet  3  . NON FORMULARY Freestyle Lancets misc- two times a day       . NON FORMULARY Freestyle test strips- bid         No facility-administered medications prior to visit.    PE: Blood pressure 130/76, pulse 119, temperature 98.2 F (36.8 C), temperature source Temporal, height 5\' 9"  (1.753 m), weight 224 lb (101.606 kg), SpO2 94.00%. Gen: alert, oriented x 4, NAD.  Beads of sweat on forehead. ENT:  Eyes: no injection, icteris, swelling, or exudate.  EOMI, PERRLA. Nose: no drainage or turbinate edema/swelling.  No injection or focal lesion.  Mouth: lips without lesion/swelling.  Oral mucosa pink and moist.    Oropharynx without erythema, exudate, or swelling.  Neck - No masses or thyromegaly or  limitation in range of motion CV: Regular, tachycardia (about 100-110), no m/r/g.   LUNGS: CTA bilat, nonlabored resps, good aeration in all lung fields. ABD: soft, nondistended, BS normal.  Mild diffuse TTP, without guarding or rebound.  No bruit, mass, or palpable HSM. EXT: no clubbing or cyanosis.  No signif edema.  He has several splotches of hyperpigmented macular rash on both lower legs, ant>post.  No erythema, no skin breakdown.  Some dry excoriations are noted where he has been scratching a lot.  LAB: none  IMPRESSION AND PLAN:  Viral gastroenteritis Discussed dx, self limiting illness, importance of trying to maintain hydration by pushing small amounts of clear liquids frequently--advance as tolerated. Phenergan 12.5mg , 1-2 q6h prn. Dexilant 60mg  qd (#10 samples given today), 1 qd x 5d, then 1 qd prn. Take H2 blocker today x 1 dose. HOLD METFORMIN until you are feeling well again. Take Imodium for diarrhea if you are having a loose BM more often than every 2 hours. Signs/symptoms to call or return for were reviewed and pt expressed understanding.   Dermatitis Generic cutivate 0.05% cream bid prn to LE rash.   An After Visit Summary was printed and given to the patient.  FOLLOW UP: prn

## 2012-11-17 NOTE — Patient Instructions (Addendum)
HOLD METFORMIN until you are feeling well again. Take Imodium for diarrhea if you are having a loose BM more often than every 2 hours.

## 2012-11-17 NOTE — Assessment & Plan Note (Addendum)
Discussed dx, self limiting illness, importance of trying to maintain hydration by pushing small amounts of clear liquids frequently--advance as tolerated. Phenergan 12.5mg , 1-2 q6h prn. Dexilant 60mg  qd (#10 samples given today), 1 qd x 5d, then 1 qd prn. Take H2 blocker today x 1 dose. HOLD METFORMIN until you are feeling well again. Take Imodium for diarrhea if you are having a loose BM more often than every 2 hours. Signs/symptoms to call or return for were reviewed and pt expressed understanding.

## 2012-12-16 ENCOUNTER — Other Ambulatory Visit: Payer: Self-pay | Admitting: Family Medicine

## 2013-01-11 ENCOUNTER — Other Ambulatory Visit (INDEPENDENT_AMBULATORY_CARE_PROVIDER_SITE_OTHER): Payer: BC Managed Care – PPO

## 2013-01-11 DIAGNOSIS — I10 Essential (primary) hypertension: Secondary | ICD-10-CM

## 2013-01-11 DIAGNOSIS — E785 Hyperlipidemia, unspecified: Secondary | ICD-10-CM

## 2013-01-11 LAB — RENAL FUNCTION PANEL
BUN: 12 mg/dL (ref 6–23)
Chloride: 95 mEq/L — ABNORMAL LOW (ref 96–112)
GFR: 107.99 mL/min (ref >60.00)
Phosphorus: 3.8 mg/dL (ref 2.3–4.6)
Potassium: 4.8 mEq/L (ref 3.5–5.1)

## 2013-01-11 LAB — CBC
HCT: 43.4 % (ref 39.0–52.0)
MCHC: 34.3 g/dL (ref 30.0–36.0)
RDW: 13.7 % (ref 11.5–14.6)
WBC: 8.9 10*3/uL (ref 4.5–10.5)

## 2013-01-11 LAB — HEPATIC FUNCTION PANEL
Albumin: 4.6 g/dL (ref 3.5–5.2)
Total Protein: 7.6 g/dL (ref 6.0–8.3)

## 2013-01-11 LAB — LIPID PANEL
Cholesterol: 169 mg/dL (ref 0–200)
LDL Cholesterol: 106 mg/dL — ABNORMAL HIGH (ref 0–99)
Triglycerides: 75 mg/dL (ref 0.0–149.0)
VLDL: 15 mg/dL (ref 0.0–40.0)

## 2013-01-11 LAB — HEMOGLOBIN A1C: Hgb A1c MFr Bld: 5.8 % (ref 4.6–6.5)

## 2013-01-11 NOTE — Progress Notes (Signed)
Labs only

## 2013-01-17 ENCOUNTER — Encounter: Payer: Self-pay | Admitting: Family Medicine

## 2013-01-17 ENCOUNTER — Ambulatory Visit: Payer: BC Managed Care – PPO | Admitting: Family Medicine

## 2013-01-17 ENCOUNTER — Ambulatory Visit (INDEPENDENT_AMBULATORY_CARE_PROVIDER_SITE_OTHER): Payer: BC Managed Care – PPO | Admitting: Family Medicine

## 2013-01-17 VITALS — BP 130/80 | HR 85 | Temp 98.7°F | Ht 69.0 in | Wt 227.0 lb

## 2013-01-17 DIAGNOSIS — Z860100 Personal history of colon polyps, unspecified: Secondary | ICD-10-CM

## 2013-01-17 DIAGNOSIS — E119 Type 2 diabetes mellitus without complications: Secondary | ICD-10-CM

## 2013-01-17 DIAGNOSIS — I10 Essential (primary) hypertension: Secondary | ICD-10-CM

## 2013-01-17 DIAGNOSIS — F17200 Nicotine dependence, unspecified, uncomplicated: Secondary | ICD-10-CM

## 2013-01-17 DIAGNOSIS — E782 Mixed hyperlipidemia: Secondary | ICD-10-CM

## 2013-01-17 HISTORY — DX: Personal history of colonic polyps: Z86.010

## 2013-01-17 HISTORY — DX: Personal history of colon polyps, unspecified: Z86.0100

## 2013-01-17 MED ORDER — SIMVASTATIN 10 MG PO TABS: 10.0000 mg | ORAL_TABLET | Freq: Every day | ORAL | Status: DC

## 2013-01-17 MED ORDER — METFORMIN HCL 1000 MG PO TABS: 1000.0000 mg | ORAL_TABLET | Freq: Two times a day (BID) | ORAL | Status: DC

## 2013-01-17 MED ORDER — LISINOPRIL-HYDROCHLOROTHIAZIDE 20-25 MG PO TABS: 1.0000 | ORAL_TABLET | Freq: Every day | ORAL | Status: DC

## 2013-01-17 MED ORDER — METOPROLOL SUCCINATE ER 200 MG PO TB24: 200.0000 mg | ORAL_TABLET | Freq: Every day | ORAL | Status: DC

## 2013-01-17 MED ORDER — SITAGLIPTIN PHOSPHATE 100 MG PO TABS: 100.0000 mg | ORAL_TABLET | Freq: Every day | ORAL | Status: DC

## 2013-01-17 NOTE — Assessment & Plan Note (Signed)
Well controlled today, no changes 

## 2013-01-17 NOTE — Progress Notes (Signed)
Patient ID: Judd Gaudier, male   DOB: [birth year], 57 y.o.   MRN: [id number] HAYDON DORRIS [id number] 11-30-1955 01/17/2013      Progress Note-Follow Up  Subjective  Chief Complaint  Chief Complaint  Patient presents with  . Follow-up    6 month    HPI  Patient is a 57 year old Caucasian male who is in today for followup. He is reporting good health. Unfortunately in smoking per day. He is working in a very stressful environment and finds himself reaching for cigarettes. Uses a cigarette occasionally. Has used Chantix in the past with some decent results is considering trying again. Otherwise no complaints. No chest pain, palpitations, shortness of breath, fevers, headaches, GI or GU complaints are noted today.  Past Medical History  Diagnosis Date  . Allergy     rhinitis  . Hypertension   . Obesity   . Ulcer 1982    peptic ulcer disease  . Impaired glucose tolerance   . Diabetes mellitus   . TOBACCO ABUSE 07/04/2010  . PEPTIC ULCER DISEASE 02/15/2007  . NISSEN FUNDOPLICATION, HX OF 02/15/2007  . Mixed hyperlipidemia 07/04/2010  . LUMBAR STRAIN 10/17/2010  . HYPERTENSION 02/15/2007  . EXOGENOUS OBESITY 02/15/2007  . DM 10/24/2010  . ALLERGIC RHINITIS 02/15/2007  . HTN (hypertension) 08/20/2011  . OA (osteoarthritis) of knee 02/17/2012  . ED (erectile dysfunction) 02/17/2012  . Bronchitis 07/14/2012  . Preventative health care 07/19/2012    Past Surgical History  Procedure Laterality Date  . Rotator cuff repair  2000  . Vasectomy  1982  . Fundiplication for hh and reflux  1987  . Negative stress test  2008  . Colonoscopy  03/2012    Several hyperplastic polyps    Family History  Problem Relation Age of Onset  . Asthma Mother   . Cancer Mother     uterine   . Alcohol abuse Father   . Stroke Maternal Grandmother     possibly  . Hypertension Maternal Grandfather   . Hypertension Brother   . Cancer Brother     prostate  . Colon cancer Neg Hx     History   Social History  .  Marital Status: Married    Spouse Name: [name]    Number of Children: N/A  . Years of Education: N/A   Occupational History  .  [employer]   Social History Main Topics  . Smoking status: Current Every Day Smoker    Types: Cigarettes  . Smokeless tobacco: Former Neurosurgeon    Quit date: 03/18/2012     Comment: quit 06/18/10- started back   . Alcohol Use: 0.0 oz/week    3-5 Cans of beer per week     Comment: 3-5 beer daily  . Drug Use: No  . Sexually Active: Yes -- Male partner(s)   Other Topics Concern  . Not on file   Social History Narrative  . No narrative on file    Current Outpatient Prescriptions on File Prior to Visit  Medication Sig Dispense Refill  . fish oil-omega-3 fatty acids 1000 MG capsule Take 2 g by mouth daily.        Marland Kitchen glucose blood test strip 1 each by Other route 2 (two) times daily. Diag. Code 250.00  100 each  1  . Lancets (FREESTYLE) lancets 1 each by Other route 2 (two) times daily. diag code 250.00  100 each  1  . Multiple Vitamins-Minerals (PA MENS 50 PLUS VITAPAK PO) Take 1 capsule by mouth  daily.       No current facility-administered medications on file prior to visit.    No Known Allergies  Review of Systems  Review of Systems  Constitutional: Negative for fever and malaise/fatigue.  HENT: Negative for congestion.   Eyes: Negative for discharge.  Respiratory: Negative for shortness of breath.   Cardiovascular: Negative for chest pain, palpitations and leg swelling.  Gastrointestinal: Negative for nausea, abdominal pain and diarrhea.  Genitourinary: Negative for dysuria.  Musculoskeletal: Negative for falls.  Skin: Negative for rash.  Neurological: Negative for loss of consciousness and headaches.  Endo/Heme/Allergies: Negative for polydipsia.  Psychiatric/Behavioral: Negative for depression and suicidal ideas. The patient is not nervous/anxious and does not have insomnia.     Objective  BP 130/80  Pulse 85  Temp(Src) 98.7 F (37.1 C)  (Oral)  Ht 5\' 9"  (1.753 m)  Wt 227 lb 0.6 oz (102.985 kg)  BMI 33.51 kg/m2  SpO2 96%  Physical Exam  Physical Exam  Constitutional: He is oriented to person, place, and time and well-developed, well-nourished, and in no distress. No distress.  HENT:  Head: Normocephalic and atraumatic.  Eyes: Conjunctivae are normal.  Neck: Neck supple. No thyromegaly present.  Cardiovascular: Normal rate, regular rhythm and normal heart sounds.   No murmur heard. Pulmonary/Chest: Effort normal and breath sounds normal. No respiratory distress.  Abdominal: He exhibits no distension and no mass. There is no tenderness.  Musculoskeletal: He exhibits no edema.  Neurological: He is alert and oriented to person, place, and time.  Skin: Skin is warm.  Psychiatric: Memory, affect and judgment normal.    Lab Results  Component Value Date   TSH 0.86 01/11/2013   Lab Results  Component Value Date   WBC 8.9 01/11/2013   HGB 14.9 01/11/2013   HCT 43.4 01/11/2013   MCV 95.1 01/11/2013   PLT 322.0 01/11/2013   Lab Results  Component Value Date   CREATININE 0.8 01/11/2013   BUN 12 01/11/2013   NA 134* 01/11/2013   K 4.8 01/11/2013   CL 95* 01/11/2013   CO2 31 01/11/2013   Lab Results  Component Value Date   ALT 27 01/11/2013   AST 18 01/11/2013   ALKPHOS 62 01/11/2013   BILITOT 0.5 01/11/2013   Lab Results  Component Value Date   CHOL 169 01/11/2013   Lab Results  Component Value Date   HDL 48.50 01/11/2013   Lab Results  Component Value Date   LDLCALC 106* 01/11/2013   Lab Results  Component Value Date   TRIG 75.0 01/11/2013   Lab Results  Component Value Date   CHOLHDL 3 01/11/2013     Assessment & Plan  DM hgba1c 5.8 up from 5.5 since switching to Januvia, sugar is 130 this am.   MIXED HYPERLIPIDEMIA Simvastatin is started today and co Q also, avoid trans fats  HTN (hypertension) Well controlled today, no changes  TOBACCO ABUSE Considering Chantix again, has used in past. Back to  1/2 ppd.

## 2013-01-17 NOTE — Assessment & Plan Note (Signed)
Considering Chantix again, has used in past. Back to 1/2 ppd.

## 2013-01-17 NOTE — Assessment & Plan Note (Addendum)
Simvastatin is started today and co Q also, avoid trans fats

## 2013-01-17 NOTE — Patient Instructions (Addendum)
Start CoQ 10 daily Minimize the simple carbs  Labs prior to visit hgba1c, lipid, renal, cbc, hepatic, tsh  Cholesterol Cholesterol is a white, waxy, fat-like protein needed by your body in small amounts. The liver makes all the cholesterol you need. It is carried from the liver by the blood through the blood vessels. Deposits (plaque) may build up on blood vessel walls. This makes the arteries narrower and stiffer. Plaque increases the risk for heart attack and stroke. You cannot feel your cholesterol level even if it is very high. The only way to know is by a blood test to check your lipid (fats) levels. Once you know your cholesterol levels, you should keep a record of the test results. Work with your caregiver to to keep your levels in the desired range. WHAT THE RESULTS MEAN:  Total cholesterol is a rough measure of all the cholesterol in your blood.  LDL is the so-called bad cholesterol. This is the type that deposits cholesterol in the walls of the arteries. You want this level to be low.  HDL is the good cholesterol because it cleans the arteries and carries the LDL away. You want this level to be high.  Triglycerides are fat that the body can either burn for energy or store. High levels are closely linked to heart disease. DESIRED LEVELS:  Total cholesterol below 200.  LDL below 100 for people at risk, below 70 for very high risk.  HDL above 50 is good, above 60 is best.  Triglycerides below 150. HOW TO LOWER YOUR CHOLESTEROL:  Diet.  Choose fish or white meat chicken and [place], roasted or baked. Limit fatty cuts of red meat, fried foods, and processed meats, such as sausage and lunch meat.  Eat lots of fresh fruits and vegetables. Choose whole grains, beans, pasta, potatoes and cereals.  Use only small amounts of olive, corn or canola oils. Avoid butter, mayonnaise, shortening or palm kernel oils. Avoid foods with trans-fats.  Use skim/nonfat milk and low-fat/nonfat  yogurt and cheeses. Avoid whole milk, cream, ice cream, egg yolks and cheeses. Healthy desserts include angel food cake, ginger snaps, animal crackers, hard candy, popsicles, and low-fat/nonfat frozen yogurt. Avoid pastries, cakes, pies and cookies.  Exercise.  A regular program helps decrease LDL and raises HDL.  Helps with weight control.  Do things that increase your activity level like gardening, walking, or taking the stairs.  Medication.  May be prescribed by your caregiver to help lowering cholesterol and the risk for heart disease.  You may need medicine even if your levels are normal if you have several risk factors. HOME CARE INSTRUCTIONS   Follow your diet and exercise programs as suggested by your caregiver.  Take medications as directed.  Have blood work done when your caregiver feels it is necessary. MAKE SURE YOU:   Understand these instructions.  Will watch your condition.  Will get help right away if you are not doing well or get worse. Document Released: 04/29/2001 Document Revised: 10/27/2011 Document Reviewed: 10/20/2007 [facility] Patient Information 2014 [place], [place].

## 2013-01-17 NOTE — Assessment & Plan Note (Signed)
hgba1c 5.8 up from 5.5 since switching to Januvia, sugar is 130 this am.

## 2013-02-16 ENCOUNTER — Telehealth: Payer: Self-pay | Admitting: Family Medicine

## 2013-02-16 DIAGNOSIS — E119 Type 2 diabetes mellitus without complications: Secondary | ICD-10-CM

## 2013-02-16 MED ORDER — SITAGLIPTIN PHOSPHATE 100 MG PO TABS: 100.0000 mg | ORAL_TABLET | Freq: Every day | ORAL | Status: DC

## 2013-02-16 NOTE — Telephone Encounter (Signed)
Refill- januvia 100mg  tablet. Take one tablet (100mg  total) by mouth daily. Qty 30 last fill 5.1.14

## 2013-04-17 ENCOUNTER — Other Ambulatory Visit: Payer: Self-pay | Admitting: Family Medicine

## 2013-04-19 NOTE — Telephone Encounter (Signed)
Rx Denied; Last Rx 06.02.14, #[id number]; pt should have remaining refill/SLS

## 2013-05-24 ENCOUNTER — Encounter: Payer: Self-pay | Admitting: Family Medicine

## 2013-05-24 ENCOUNTER — Ambulatory Visit (INDEPENDENT_AMBULATORY_CARE_PROVIDER_SITE_OTHER): Payer: BC Managed Care – PPO | Admitting: Family Medicine

## 2013-05-24 VITALS — BP 142/68 | HR 77 | Temp 98.5°F | Ht 69.0 in | Wt 232.1 lb

## 2013-05-24 DIAGNOSIS — F17200 Nicotine dependence, unspecified, uncomplicated: Secondary | ICD-10-CM

## 2013-05-24 DIAGNOSIS — J209 Acute bronchitis, unspecified: Secondary | ICD-10-CM

## 2013-05-24 DIAGNOSIS — J40 Bronchitis, not specified as acute or chronic: Secondary | ICD-10-CM

## 2013-05-24 DIAGNOSIS — E782 Mixed hyperlipidemia: Secondary | ICD-10-CM

## 2013-05-24 DIAGNOSIS — Z23 Encounter for immunization: Secondary | ICD-10-CM

## 2013-05-24 DIAGNOSIS — E785 Hyperlipidemia, unspecified: Secondary | ICD-10-CM

## 2013-05-24 DIAGNOSIS — E119 Type 2 diabetes mellitus without complications: Secondary | ICD-10-CM

## 2013-05-24 DIAGNOSIS — I10 Essential (primary) hypertension: Secondary | ICD-10-CM

## 2013-05-24 LAB — LIPID PANEL
Cholesterol: 133 mg/dL (ref 0–200)
HDL: 41 mg/dL (ref >39)
Total CHOL/HDL Ratio: 3.2 Ratio
Triglycerides: 88 mg/dL (ref <150)
VLDL: 18 mg/dL (ref 0–40)

## 2013-05-24 LAB — HEPATIC FUNCTION PANEL
ALT: 30 U/L (ref 0–53)
Bilirubin, Direct: 0.1 mg/dL (ref 0.0–0.3)
Indirect Bilirubin: 0.3 mg/dL (ref 0.0–0.9)

## 2013-05-24 LAB — RENAL FUNCTION PANEL
Calcium: 9.6 mg/dL (ref 8.4–10.5)
Glucose, Bld: 119 mg/dL — ABNORMAL HIGH (ref 70–99)
Potassium: 4.8 mEq/L (ref 3.5–5.3)
Sodium: 135 mEq/L (ref 135–145)

## 2013-05-24 MED ORDER — ALBUTEROL SULFATE HFA 108 (90 BASE) MCG/ACT IN AERS: 2.0000 | INHALATION_SPRAY | Freq: Four times a day (QID) | RESPIRATORY_TRACT | Status: DC | PRN

## 2013-05-24 NOTE — Progress Notes (Signed)
Patient ID: Phillip Doyle, male   DOB: [birth year], 57 y.o.   MRN: [id number] Phillip Doyle [id number] 07/24/1956 05/24/2013      Progress Note-Follow Up  Subjective  Chief Complaint  Chief Complaint  Patient presents with  . Follow-up    4 month  . Injections    flu    HPI  Patient was a 57 year old Caucasian male in today for followup. He feels mostly well although he has been struggling with some respiratory symptoms for the last 4 days. He notes a mild nonproductive cough and some congestion. Denies any fevers or chills. No malaise or myalgias. He denies any chest pain, palpitations, GI or GU complaints. He has had some mild wheezing but no significant shortness of breath. Prior to this illness he reports he had been feeling well. Has been taking medications as prescribed. Notes his blood sugars had been mostly in the 09/07/1928 range both fasting and at each bedtime  Past Medical History  Diagnosis Date  . Allergy     rhinitis  . Hypertension   . Obesity   . Ulcer 1982    peptic ulcer disease  . Impaired glucose tolerance   . Diabetes mellitus   . TOBACCO ABUSE 07/04/2010  . PEPTIC ULCER DISEASE 02/15/2007  . NISSEN FUNDOPLICATION, HX OF 02/15/2007  . Mixed hyperlipidemia 07/04/2010  . LUMBAR STRAIN 10/17/2010  . HYPERTENSION 02/15/2007  . EXOGENOUS OBESITY 02/15/2007  . DM 10/24/2010  . ALLERGIC RHINITIS 02/15/2007  . HTN (hypertension) 08/20/2011  . OA (osteoarthritis) of knee 02/17/2012  . ED (erectile dysfunction) 02/17/2012  . Bronchitis 07/14/2012  . Preventative health care 07/19/2012  . Personal history of colonic polyps 01/17/2013    colonoscoppy 2013, benign polyp advised repeat in 10 years    Past Surgical History  Procedure Laterality Date  . Rotator cuff repair  2000  . Vasectomy  1982  . Fundiplication for hh and reflux  1987  . Negative stress test  2008  . Colonoscopy  03/2012    Several hyperplastic polyps    Family History  Problem Relation Age of Onset  .  Asthma Mother   . Cancer Mother     uterine   . Alcohol abuse Father   . Stroke Maternal Grandmother     possibly  . Hypertension Maternal Grandfather   . Hypertension Brother   . Cancer Brother     prostate  . Colon cancer Neg Hx     History   Social History  . Marital Status: Married    Spouse Name: [name]    Number of Children: N/A  . Years of Education: N/A   Occupational History  .  [employer]   Social History Main Topics  . Smoking status: Current Every Day Smoker    Types: Cigarettes  . Smokeless tobacco: Former Neurosurgeon    Quit date: 03/18/2012     Comment: quit 06/18/10- started back   . Alcohol Use: 0.0 oz/week    3-5 Cans of beer per week     Comment: 3-5 beer daily  . Drug Use: No  . Sexual Activity: Yes    Partners: Female   Other Topics Concern  . Not on file   Social History Narrative  . No narrative on file    Current Outpatient Prescriptions on File Prior to Visit  Medication Sig Dispense Refill  . fish oil-omega-3 fatty acids 1000 MG capsule Take 2 g by mouth daily.        Marland Kitchen  glucose blood test strip 1 each by Other route 2 (two) times daily. Diag. Code 250.00  100 each  1  . Lancets (FREESTYLE) lancets 1 each by Other route 2 (two) times daily. diag code 250.00  100 each  1  . lisinopril-hydrochlorothiazide (PRINZIDE,ZESTORETIC) 20-25 MG per tablet Take 1 tablet by mouth daily.  90 tablet  1  . metFORMIN (GLUCOPHAGE) 1000 MG tablet Take 1 tablet (1,000 mg total) by mouth 2 (two) times daily with a meal.  180 tablet  1  . metoprolol (TOPROL-XL) 200 MG 24 hr tablet Take 1 tablet (200 mg total) by mouth daily.  90 tablet  1  . Multiple Vitamins-Minerals (PA MENS 50 PLUS VITAPAK PO) Take 1 capsule by mouth daily.      . simvastatin (ZOCOR) 10 MG tablet Take 1 tablet (10 mg total) by mouth at bedtime.  30 tablet  4  . sitaGLIPtin (JANUVIA) 100 MG tablet Take 1 tablet (100 mg total) by mouth daily.  30 tablet  4   No current facility-administered medications on  file prior to visit.    No Known Allergies  Review of Systems  Review of Systems  Constitutional: Negative for fever, chills and malaise/fatigue.  HENT: Positive for congestion and sore throat. Negative for ear pain.   Eyes: Negative for discharge.  Respiratory: Positive for cough. Negative for sputum production and shortness of breath.   Cardiovascular: Negative for chest pain, palpitations and leg swelling.  Gastrointestinal: Negative for nausea, abdominal pain and diarrhea.  Genitourinary: Negative for dysuria.  Musculoskeletal: Negative for myalgias and falls.  Skin: Negative for rash.  Neurological: Negative for loss of consciousness and headaches.  Endo/Heme/Allergies: Negative for polydipsia.  Psychiatric/Behavioral: Negative for depression and suicidal ideas. The patient is not nervous/anxious and does not have insomnia.     Objective  BP 142/68  Pulse 77  Temp(Src) 98.5 F (36.9 C) (Oral)  Ht 5\' 9"  (1.753 m)  Wt 232 lb 1.9 oz (105.289 kg)  BMI 34.26 kg/m2  SpO2 98%  Physical Exam  Physical Exam  Constitutional: He is oriented to person, place, and time and well-developed, well-nourished, and in no distress. No distress.  HENT:  Head: Normocephalic and atraumatic.  Eyes: Conjunctivae are normal.  Neck: Neck supple. No thyromegaly present.  Cardiovascular: Normal rate, regular rhythm and normal heart sounds.   No murmur heard. Pulmonary/Chest: Effort normal and breath sounds normal. No respiratory distress.  Abdominal: He exhibits no distension and no mass. There is no tenderness.  Musculoskeletal: He exhibits no edema.  Neurological: He is alert and oriented to person, place, and time.  Skin: Skin is warm.  Psychiatric: Memory, affect and judgment normal.    Lab Results  Component Value Date   TSH 0.86 01/11/2013   Lab Results  Component Value Date   WBC 8.9 01/11/2013   HGB 14.9 01/11/2013   HCT 43.4 01/11/2013   MCV 95.1 01/11/2013   PLT 322.0  01/11/2013   Lab Results  Component Value Date   CREATININE 0.8 01/11/2013   BUN 12 01/11/2013   NA 134* 01/11/2013   K 4.8 01/11/2013   CL 95* 01/11/2013   CO2 31 01/11/2013   Lab Results  Component Value Date   ALT 27 01/11/2013   AST 18 01/11/2013   ALKPHOS 62 01/11/2013   BILITOT 0.5 01/11/2013   Lab Results  Component Value Date   CHOL 169 01/11/2013   Lab Results  Component Value Date   HDL 48.50 01/11/2013  Lab Results  Component Value Date   LDLCALC 106* 01/11/2013   Lab Results  Component Value Date   TRIG 75.0 01/11/2013   Lab Results  Component Value Date   CHOLHDL 3 01/11/2013     Assessment & Plan  HTN (hypertension) Adequate control today, no changes  Bronchitis Symptoms started about 4 days ago, mild, encouraged probiotics, mucinex, and zinc, increase rest and hydration. Call if worse, Albuterol is given prn  DM Patient is doing well, no recent spikes in sugar. Continue current meds. Check hgba1c today  MIXED HYPERLIPIDEMIA Continue current meds, avoid trans fats, check lipids  TOBACCO ABUSE Down to 1/3 ppd, encouraged complete cessation

## 2013-05-24 NOTE — Assessment & Plan Note (Signed)
Adequate control today, no changes 

## 2013-05-24 NOTE — Assessment & Plan Note (Signed)
Symptoms started about 4 days ago, mild, encouraged probiotics, mucinex, and zinc, increase rest and hydration. Call if worse, Albuterol is given prn

## 2013-05-24 NOTE — Assessment & Plan Note (Signed)
Continue current meds, avoid trans fats, check lipids

## 2013-05-24 NOTE — Assessment & Plan Note (Signed)
Down to 1/3 ppd, encouraged complete cessation

## 2013-05-24 NOTE — Patient Instructions (Addendum)
Mucinex, Zinc and probiotic such as Digestive Advantage daily   Nicotine Addiction Nicotine can act as both a stimulant (excites/activates) and a sedative (calms/quiets). Immediately after exposure to nicotine, there is a "kick" caused in part by the drug's stimulation of the adrenal glands and resulting discharge of adrenaline (epinephrine). The rush of adrenaline stimulates the body and causes a sudden release of sugar. This means that smokers are always slightly hyperglycemic. Hyperglycemic means that the blood sugar is high, just like in diabetics. Nicotine also decreases the amount of insulin which helps control sugar levels in the body. There is an increase in blood pressure, breathing, and the rate of heart beats.  In addition, nicotine indirectly causes a release of dopamine in the brain that controls pleasure and motivation. A similar reaction is seen with other drugs of abuse, such as cocaine and heroin. This dopamine release is thought to cause the pleasurable sensations when smoking. In some different cases, nicotine can also create a calming effect, depending on sensitivity of the smoker's nervous system and the dose of nicotine taken. WHAT HAPPENS WHEN NICOTINE IS TAKEN FOR LONG PERIODS OF TIME?  Long-term use of nicotine results in addiction. It is difficult to stop.  Repeated use of nicotine creates tolerance. Higher doses of nicotine are needed to get the "kick." When nicotine use is stopped, withdrawal may last a month or more. Withdrawal may begin within a few hours after the last cigarette. Symptoms peak within the first few days and may lessen within a few weeks. For some people, however, symptoms may last for months or longer. Withdrawal symptoms include:   Irritability.  Craving.  Learning and attention deficits.  Sleep disturbances.  Increased appetite. Craving for tobacco may last for 6 months or longer. Many behaviors done while using nicotine can also play a part in  the severity of withdrawal symptoms. For some people, the feel, smell, and sight of a cigarette and the ritual of obtaining, handling, lighting, and smoking the cigarette are closely linked with the pleasure of smoking. When stopped, they also miss the related behaviors which make the withdrawal or craving worse. While nicotine gum and patches may lessen the drug aspects of withdrawal, cravings often persist. WHAT ARE THE MEDICAL CONSEQUENCES OF NICOTINE USE?  Nicotine addiction accounts for one-third of all cancers. The top cancer caused by tobacco is lung cancer. Lung cancer is the number one cancer killer of both men and women.  Smoking is also associated with cancers of the:  Mouth.  Pharynx.  Larynx.  Esophagus.  Stomach.  Pancreas.  Cervix.  Kidney.  Ureter.  Bladder.  Smoking also causes lung diseases such as lasting (chronic) bronchitis and emphysema.  It worsens asthma in adults and children.  Smoking increases the risk of heart disease, including:  Stroke.  Heart attack.  Vascular disease.  Aneurysm.  Passive or secondary smoke can also increase medical risks including:  Asthma in children.  Sudden Infant Death Syndrome (SIDS).  Additionally, dropped cigarettes are the leading cause of residential fire fatalities.  Nicotine poisoning has been reported from accidental ingestion of tobacco products by children and pets. Death usually results in a few minutes from respiratory failure (when a person stops breathing) caused by paralysis. TREATMENT   Medication. Nicotine replacement medicines such as nicotine gum and the patch are used to stop smoking. These medicines gradually lower the dosage of nicotine in the body. These medicines do not contain the carbon monoxide and other toxins found in tobacco smoke.  Hypnotherapy.  Relaxation therapy.  Nicotine Anonymous (a 12-step support program). Find times and locations in your local yellow pages. Document  Released: 04/09/2004 Document Revised: 10/27/2011 Document Reviewed: 09/01/2007 [facility] Patient Information 2014 [place], [place].

## 2013-05-24 NOTE — Addendum Note (Signed)
Addended by: Court Joy on: 05/24/2013 09:01 AM   Modules accepted: Orders

## 2013-05-24 NOTE — Assessment & Plan Note (Signed)
Patient is doing well, no recent spikes in sugar. Continue current meds. Check hgba1c today

## 2013-06-24 ENCOUNTER — Other Ambulatory Visit: Payer: Self-pay | Admitting: Family Medicine

## 2013-07-01 ENCOUNTER — Telehealth: Payer: Self-pay | Admitting: *Deleted

## 2013-07-01 NOTE — Telephone Encounter (Signed)
OK to restart Tradjenta and stop Januvia. Send rx to pharmacy for Tradjenta at old sig, same #, put on rx that he failed Januvia. Disp #30 3 rf and let patient know

## 2013-07-01 NOTE — Telephone Encounter (Signed)
Patient reports that his CBGs are "not in goal" with new blood sugar medication [Januvia] and request to change back to prior medication [Tradjenta]; understands that Insurance will not cover & states will pay OOP/SLS Please Advise.

## 2013-07-01 NOTE — Telephone Encounter (Signed)
Ok Tradjenta states bid in med history and said allergic reaction is why is was stopped?

## 2013-07-01 NOTE — Telephone Encounter (Signed)
No I remember stopping it due to cost and bid is fine, previous sig is required.

## 2013-07-01 NOTE — Telephone Encounter (Signed)
THE FOLLOWING ERROR CAME UP? pLEASE ADVISE   Dose Warning Report  linagliptin, 5 mg, Oral, 2 times daily  Significance: Exceeds Daily, Frequency  Ordered daily dose: 10 mg (5 mg 2 times daily) Recommended daily dose: 5 mg Exceeds maximum by: 100% (5 mg)  Ordered frequency: 2 times daily Recommended frequency: 1 doses/day Exceeds maximum by: 1 doses/day  Patient conditions are: Gender: Male Age: 57 y.o. Weight: 105.3 kg [Weight as of Tue May 24, 2013 0759]  Order conditions are: Ordering Mode: Outpatient Route: Oral and equivalent  Conditions Single Dose Daily Dose Frequency Duration Of Therapy Custom Rule?  Ordered Dose:  5 mg 10 mg 2 doses/day    Age: 57 y.o. - 57 y.o., Route: Oral, Diagnosis: 250.00, Dose Type: Maintenance Dose <=5 mg 5 mg 1 doses/day >=1 days No  Age: 57 y.o. - 57 y.o., Route: Oral, Dose Type: Maintenance Dose <=5 mg 5 mg 1 doses/day >=1 days No

## 2013-07-02 MED ORDER — LINAGLIPTIN 5 MG PO TABS: 5.0000 mg | ORAL_TABLET | Freq: Every day | ORAL | Status: DC

## 2013-07-02 NOTE — Telephone Encounter (Signed)
We will have to prescribe it just QD after reviewing

## 2013-07-15 ENCOUNTER — Ambulatory Visit (INDEPENDENT_AMBULATORY_CARE_PROVIDER_SITE_OTHER): Payer: BC Managed Care – PPO | Admitting: Family

## 2013-07-15 ENCOUNTER — Encounter: Payer: Self-pay | Admitting: Family

## 2013-07-15 VITALS — BP 146/80 | HR 105 | Temp 98.5°F | Resp 16 | Ht 69.0 in | Wt 238.0 lb

## 2013-07-15 DIAGNOSIS — M545 Low back pain, unspecified: Secondary | ICD-10-CM

## 2013-07-15 MED ORDER — TRAMADOL HCL 50 MG PO TABS: 50.0000 mg | ORAL_TABLET | Freq: Three times a day (TID) | ORAL | Status: DC | PRN

## 2013-07-15 MED ORDER — METHYLPREDNISOLONE 4 MG PO KIT: PACK | ORAL | Status: DC

## 2013-07-15 NOTE — Patient Instructions (Signed)
Check sugars twice daily while on medrol dose pak. Call if symptoms worsen or if not improved in 2-3 days.

## 2013-07-15 NOTE — Progress Notes (Signed)
Subjective:    Patient ID: Phillip Doyle, male    DOB: [birth year], 56 y.o.   MRN: [id number]  HPI  Phillip Doyle is a 57 yr old male who presents today with chief complaint of leg pain.  Pain is located left lateral leg and radiates don thel left leg.  Worse with laying down, improved with getting up and around. Denies hx of back pain or similar leg pain.  Symptoms started Sunday.  He has tried tylenol arthritis without significant improvement.    Review of Systems    see HPI  Past Medical History  Diagnosis Date  . Allergy     rhinitis  . Hypertension   . Obesity   . Ulcer 1982    peptic ulcer disease  . Impaired glucose tolerance   . Diabetes mellitus   . TOBACCO ABUSE 07/04/2010  . PEPTIC ULCER DISEASE 02/15/2007  . NISSEN FUNDOPLICATION, HX OF 02/15/2007  . Mixed hyperlipidemia 07/04/2010  . LUMBAR STRAIN 10/17/2010  . HYPERTENSION 02/15/2007  . EXOGENOUS OBESITY 02/15/2007  . DM 10/24/2010  . ALLERGIC RHINITIS 02/15/2007  . HTN (hypertension) 08/20/2011  . OA (osteoarthritis) of knee 02/17/2012  . ED (erectile dysfunction) 02/17/2012  . Bronchitis 07/14/2012  . Preventative health care 07/19/2012  . Personal history of colonic polyps 01/17/2013    colonoscoppy 2013, benign polyp advised repeat in 10 years    History   Social History  . Marital Status: Married    Spouse Name: [name]    Number of Children: N/A  . Years of Education: N/A   Occupational History  .  [employer]   Social History Main Topics  . Smoking status: Current Every Day Smoker    Types: Cigarettes  . Smokeless tobacco: Former Neurosurgeon    Quit date: 03/18/2012     Comment: quit 06/18/10- started back   . Alcohol Use: 0.0 oz/week    3-5 Cans of beer per week     Comment: 3-5 beer daily  . Drug Use: No  . Sexual Activity: Yes    Partners: Female   Other Topics Concern  . Not on file   Social History Narrative  . No narrative on file    Past Surgical History  Procedure Laterality Date  . Rotator cuff repair   2000  . Vasectomy  1982  . Fundiplication for hh and reflux  1987  . Negative stress test  2008  . Colonoscopy  03/2012    Several hyperplastic polyps    Family History  Problem Relation Age of Onset  . Asthma Mother   . Cancer Mother     uterine   . Alcohol abuse Father   . Stroke Maternal Grandmother     possibly  . Hypertension Maternal Grandfather   . Hypertension Brother   . Cancer Brother     prostate  . Colon cancer Neg Hx     No Known Allergies  Current Outpatient Prescriptions on File Prior to Visit  Medication Sig Dispense Refill  . albuterol (PROVENTIL HFA;VENTOLIN HFA) 108 (90 BASE) MCG/ACT inhaler Inhale 2 puffs into the lungs every 6 (six) hours as needed for wheezing.  1 Inhaler  0  . fish oil-omega-3 fatty acids 1000 MG capsule Take 2 g by mouth daily.        Marland Kitchen glucose blood test strip 1 each by Other route 2 (two) times daily. Diag. Code 250.00  100 each  1  . Lancets (FREESTYLE) lancets 1 each by Other route  2 (two) times daily. diag code 250.00  100 each  1  . linagliptin (TRADJENTA) 5 MG TABS tablet Take 1 tablet (5 mg total) by mouth daily. Patient failed Januvia  30 tablet  3  . lisinopril-hydrochlorothiazide (PRINZIDE,ZESTORETIC) 20-25 MG per tablet Take 1 tablet by mouth daily.  90 tablet  1  . metFORMIN (GLUCOPHAGE) 1000 MG tablet Take 1 tablet (1,000 mg total) by mouth 2 (two) times daily with a meal.  180 tablet  1  . metoprolol (TOPROL-XL) 200 MG 24 hr tablet Take 1 tablet (200 mg total) by mouth daily.  90 tablet  1  . Multiple Vitamins-Minerals (PA MENS 50 PLUS VITAPAK PO) Take 1 capsule by mouth daily.      . simvastatin (ZOCOR) 10 MG tablet TAKE 1 TABLET (10 MG TOTAL) BY MOUTH AT BEDTIME.  30 tablet  4   No current facility-administered medications on file prior to visit.    BP 146/80  Pulse 105  Temp(Src) 98.5 F (36.9 C) (Oral)  Resp 16  Ht 5\' 9"  (1.753 m)  Wt 238 lb (107.956 kg)  BMI 35.13 kg/m2  SpO2 96%    Objective:   Physical  Exam  Constitutional: He is oriented to person, place, and time. He appears well-developed and well-nourished. No distress.  HENT:  Head: Normocephalic and atraumatic.  Cardiovascular: Normal rate and regular rhythm.   No murmur heard. Pulmonary/Chest: Effort normal and breath sounds normal. No respiratory distress. He has no wheezes. He has no rales. He exhibits no tenderness.  Musculoskeletal: He exhibits no edema.       Thoracic back: He exhibits no tenderness.       Lumbar back: He exhibits no tenderness.  Bilateral LE strength is 5/5  Neurological: He is alert and oriented to person, place, and time.  Skin: Skin is warm and dry.  Psychiatric: He has a normal mood and affect. His behavior is normal. Judgment and thought content normal.          Assessment & Plan:

## 2013-07-15 NOTE — Progress Notes (Signed)
Pre visit review using our clinic review tool, if applicable. No additional management support is needed unless otherwise documented below in the visit note. 

## 2013-07-19 ENCOUNTER — Other Ambulatory Visit: Payer: Self-pay | Admitting: Family Medicine

## 2013-07-19 DIAGNOSIS — M545 Low back pain: Secondary | ICD-10-CM | POA: Insufficient documentation

## 2013-07-19 NOTE — Assessment & Plan Note (Signed)
Trial of medrol dose pak and flexeril.  If symptoms worsen or do not improve, consider MRI.

## 2013-07-28 ENCOUNTER — Ambulatory Visit (INDEPENDENT_AMBULATORY_CARE_PROVIDER_SITE_OTHER): Payer: BC Managed Care – PPO | Admitting: Physician Assistant

## 2013-07-28 ENCOUNTER — Encounter: Payer: Self-pay | Admitting: Physician Assistant

## 2013-07-28 VITALS — BP 138/72 | HR 98 | Temp 99.0°F | Ht 69.0 in | Wt 234.1 lb

## 2013-07-28 DIAGNOSIS — M545 Low back pain, unspecified: Secondary | ICD-10-CM

## 2013-07-28 MED ORDER — CYCLOBENZAPRINE HCL 10 MG PO TABS: 10.0000 mg | ORAL_TABLET | Freq: Three times a day (TID) | ORAL | Status: DC | PRN

## 2013-07-28 MED ORDER — TRAMADOL HCL 50 MG PO TABS: 50.0000 mg | ORAL_TABLET | Freq: Three times a day (TID) | ORAL | Status: DC | PRN

## 2013-07-28 NOTE — Patient Instructions (Signed)
Please take medications as prescribed.  Flexeril at bedtime.  Apply topical Aspercreme or Salon Pas to lower back.   You will be contacted by physical therapy to set up appt date/time.  You will also be contacted to set up MRI or your lower spine to assess for any nerve compression.

## 2013-07-28 NOTE — Progress Notes (Signed)
Pre visit review using our clinic review tool, if applicable. No additional management support is needed unless otherwise documented below in the visit note. 

## 2013-07-28 NOTE — Progress Notes (Signed)
Patient ID: Phillip Doyle, male   DOB: 1956-07-19, 57 y.o.   MRN: [id number]  Patient presents to clinic today complaining of persistent low back pain with radiation into his left leg. Patient states the pain shoots from his back all the way down his leg to his ankle. Also sometimes has a pulling sensation in his lower leg. Denies numbness or tingling of his foot. Denies leg weakness. Pain is exacerbated with rest and improved with motion. Patient was seen 2 weeks ago by Sandford Craze for similar symptoms. Was given a course of prednisone and tramadol to take for pain. Patient states the pain was not relieved with prednisone. Has just started taking tramadol with moderate relief of his symptoms. Patient states the pain keeps coming back. Patient has a history of lumbar spondylosis diagnosed on lumbar x-ray in 2012.  Patient denies recent trauma or injury to lower back. Patient has not been to physical therapy for this issue.   Past Medical History  Diagnosis Date  . Allergy     rhinitis  . Hypertension   . Obesity   . Ulcer 1982    peptic ulcer disease  . Impaired glucose tolerance   . Diabetes mellitus   . TOBACCO ABUSE 07/04/2010  . PEPTIC ULCER DISEASE 02/15/2007  . NISSEN FUNDOPLICATION, HX OF 02/15/2007  . Mixed hyperlipidemia 07/04/2010  . LUMBAR STRAIN 10/17/2010  . HYPERTENSION 02/15/2007  . EXOGENOUS OBESITY 02/15/2007  . DM 10/24/2010  . ALLERGIC RHINITIS 02/15/2007  . HTN (hypertension) 08/20/2011  . OA (osteoarthritis) of knee 02/17/2012  . ED (erectile dysfunction) 02/17/2012  . Bronchitis 07/14/2012  . Preventative health care 07/19/2012  . Personal history of colonic polyps 01/17/2013    colonoscoppy 2013, benign polyp advised repeat in 10 years    Current Outpatient Prescriptions on File Prior to Visit  Medication Sig Dispense Refill  . albuterol (PROVENTIL HFA;VENTOLIN HFA) 108 (90 BASE) MCG/ACT inhaler Inhale 2 puffs into the lungs every 6 (six) hours as needed for wheezing.  1  Inhaler  0  . fish oil-omega-3 fatty acids 1000 MG capsule Take 2 g by mouth daily.        Marland Kitchen glucose blood test strip 1 each by Other route 2 (two) times daily. Diag. Code 250.00  100 each  1  . Lancets (FREESTYLE) lancets 1 each by Other route 2 (two) times daily. diag code 250.00  100 each  1  . linagliptin (TRADJENTA) 5 MG TABS tablet Take 1 tablet (5 mg total) by mouth daily. Patient failed Januvia  30 tablet  3  . lisinopril-hydrochlorothiazide (PRINZIDE,ZESTORETIC) 20-25 MG per tablet Take 1 tablet by mouth daily.  90 tablet  1  . metFORMIN (GLUCOPHAGE) 1000 MG tablet Take 1 tablet (1,000 mg total) by mouth 2 (two) times daily with a meal.  180 tablet  1  . metoprolol (TOPROL-XL) 200 MG 24 hr tablet TAKE 1 TABLET (200 MG TOTAL) BY MOUTH DAILY.  90 tablet  1  . Multiple Vitamins-Minerals (PA MENS 50 PLUS VITAPAK PO) Take 1 capsule by mouth daily.      . simvastatin (ZOCOR) 10 MG tablet TAKE 1 TABLET (10 MG TOTAL) BY MOUTH AT BEDTIME.  30 tablet  4   No current facility-administered medications on file prior to visit.    No Known Allergies  Family History  Problem Relation Age of Onset  . Asthma Mother   . Cancer Mother     uterine   . Alcohol abuse Father   .  Stroke Maternal Grandmother     possibly  . Hypertension Maternal Grandfather   . Hypertension Brother   . Cancer Brother     prostate  . Colon cancer Neg Hx     History   Social History  . Marital Status: Married    Spouse Name: [name]    Number of Children: N/A  . Years of Education: N/A   Occupational History  .  [employer]   Social History Main Topics  . Smoking status: Current Every Day Smoker    Types: Cigarettes  . Smokeless tobacco: Former Neurosurgeon    Quit date: 03/18/2012     Comment: quit 06/18/10- started back   . Alcohol Use: 0.0 oz/week    3-5 Cans of beer per week     Comment: 3-5 beer daily  . Drug Use: No  . Sexual Activity: Yes    Partners: Female   Other Topics Concern  . None   Social History  Narrative  . None   Review of Systems - see history of present illness. All other review of systems are negative.  Filed Vitals:   07/28/13 0812  BP: 138/72  Pulse: 98  Temp: 99 F (37.2 C)   Physical Exam  Vitals reviewed. Constitutional: He is oriented to person, place, and time.  Well-developed, obese Caucasian gentleman in mild painful distress.  HENT:  Head: Normocephalic and atraumatic.  Eyes: Conjunctivae are normal.  Neck: Neck supple.  Cardiovascular: Normal rate, regular rhythm, normal heart sounds and intact distal pulses.   Pulmonary/Chest: Effort normal and breath sounds normal. No respiratory distress. He has no wheezes. He has no rales. He exhibits no tenderness.  Musculoskeletal:       Left knee: Normal.       Left ankle: Normal.       Lumbar back: He exhibits tenderness, pain and spasm. He exhibits no bony tenderness.       Left upper leg: He exhibits tenderness. He exhibits no bony tenderness, no swelling and no deformity.       Left lower leg: He exhibits no tenderness and no bony tenderness.  Neurological: He is alert and oriented to person, place, and time. No cranial nerve deficit. Gait normal.  Skin: Skin is warm and dry. No rash noted.  Psychiatric: Affect normal.   Recent Results (from the past 2160 hour(s))  RENAL FUNCTION PANEL     Status: Abnormal   Collection Time    05/24/13  8:58 AM      Result Value Range   Sodium 135  135 - 145 mEq/L   Potassium 4.8  3.5 - 5.3 mEq/L   Chloride 96  96 - 112 mEq/L   CO2 32  19 - 32 mEq/L   Glucose, Bld 119 (*) 70 - 99 mg/dL   BUN 13  6 - 23 mg/dL   Creat 1.61  0.96 - 0.45 mg/dL   Albumin 4.5  3.5 - 5.2 g/dL   Calcium 9.6  8.4 - 40.9 mg/dL   Phosphorus 3.4  2.3 - 4.6 mg/dL  HEPATIC FUNCTION PANEL     Status: None   Collection Time    05/24/13  8:58 AM      Result Value Range   Total Bilirubin 0.4  0.3 - 1.2 mg/dL   Bilirubin, Direct 0.1  0.0 - 0.3 mg/dL   Indirect Bilirubin 0.3  0.0 - 0.9 mg/dL    Alkaline Phosphatase 63  39 - 117 U/L   AST 15  0 - 37 U/L   ALT 30  0 - 53 U/L   Total Protein 7.2  6.0 - 8.3 g/dL   Albumin 4.5  3.5 - 5.2 g/dL  TSH     Status: None   Collection Time    05/24/13  8:58 AM      Result Value Range   TSH 0.872  0.350 - 4.500 uIU/mL  LIPID PANEL     Status: None   Collection Time    05/24/13  8:58 AM      Result Value Range   Cholesterol 133  0 - 200 mg/dL   Comment: ATP III Classification:           < 200        mg/dL        Desirable          200 - 239     mg/dL        Borderline High          >= 240        mg/dL        High         Triglycerides 88  <150 mg/dL   HDL 41  >40 mg/dL   Total CHOL/HDL Ratio 3.2     VLDL 18  0 - 40 mg/dL   LDL Cholesterol 74  0 - 99 mg/dL   Comment:       Total Cholesterol/HDL Ratio:CHD Risk                            Coronary Heart Disease Risk Table                                            Men       Women              1/2 Average Risk              3.4        3.3                  Average Risk              5.0        4.4               2X Average Risk              9.6        7.1               3X Average Risk             23.4       11.0     Use the calculated Patient Ratio above and the CHD Risk table      to determine the patient's CHD Risk.     ATP III Classification (LDL):           < 100        mg/dL         Optimal          100 - 129     mg/dL         Near or Above Optimal          130 - 159     mg/dL  Borderline High          160 - 189     mg/dL         High           > 190        mg/dL         Very High        HEMOGLOBIN A1C     Status: Abnormal   Collection Time    05/24/13  8:58 AM      Result Value Range   Hemoglobin A1C 6.1 (*) <5.7 %   Comment:                                                                            According to the ADA Clinical Practice Recommendations for 2011, when     HbA1c is used as a screening test:             >=6.5%   Diagnostic of Diabetes Mellitus                 (if abnormal result is confirmed)           5.7-6.4%   Increased risk of developing Diabetes Mellitus           References:Diagnosis and Classification of Diabetes Mellitus,Diabetes     Care,2011,34(Suppl 1):S62-S69 and Standards of Medical Care in             Diabetes - 2011,Diabetes Care,2011,34 (Suppl 1):S11-S61.         Mean Plasma Glucose 128 (*) <117 mg/dL    Assessment/Plan: Low back pain radiating to left leg Continue tramadol as needed for pain. Topical Aspercreme. Referral to physical therapy. We'll attempt to obtain MRI, pending insurance approval.

## 2013-07-30 NOTE — Assessment & Plan Note (Signed)
Continue tramadol as needed for pain. Topical Aspercreme. Referral to physical therapy. We'll attempt to obtain MRI, pending insurance approval.

## 2013-09-07 ENCOUNTER — Telehealth: Payer: Self-pay | Admitting: Family Medicine

## 2013-09-07 NOTE — Telephone Encounter (Signed)
PA form faxed over for Tradjent, form forward to nurse

## 2013-09-20 ENCOUNTER — Telehealth: Payer: Self-pay | Admitting: Family Medicine

## 2013-09-20 NOTE — Telephone Encounter (Signed)
Refill- freestyle lite test strip  Refill- freestyle lancets

## 2013-09-21 MED ORDER — GLUCOSE BLOOD VI STRP: ORAL_STRIP | Status: DC

## 2013-09-21 MED ORDER — FREESTYLE LANCETS MISC: 1.0000 | Freq: Two times a day (BID) | Status: DC

## 2013-10-09 ENCOUNTER — Other Ambulatory Visit: Payer: Self-pay | Admitting: Family Medicine

## 2013-10-12 ENCOUNTER — Other Ambulatory Visit: Payer: Self-pay | Admitting: Family Medicine

## 2013-11-12 ENCOUNTER — Other Ambulatory Visit: Payer: Self-pay | Admitting: Family Medicine

## 2013-11-14 ENCOUNTER — Telehealth: Payer: Self-pay | Admitting: Family Medicine

## 2013-11-14 NOTE — Telephone Encounter (Signed)
Received paperwork for PA on Tradjenta 5mg , forward to nurse

## 2013-11-20 ENCOUNTER — Other Ambulatory Visit: Payer: Self-pay | Admitting: Family Medicine

## 2013-11-21 ENCOUNTER — Other Ambulatory Visit: Payer: Self-pay

## 2013-11-21 ENCOUNTER — Telehealth: Payer: Self-pay | Admitting: Family Medicine

## 2013-11-21 DIAGNOSIS — Z125 Encounter for screening for malignant neoplasm of prostate: Secondary | ICD-10-CM

## 2013-11-21 DIAGNOSIS — I10 Essential (primary) hypertension: Secondary | ICD-10-CM

## 2013-11-21 DIAGNOSIS — E782 Mixed hyperlipidemia: Secondary | ICD-10-CM

## 2013-11-21 DIAGNOSIS — E119 Type 2 diabetes mellitus without complications: Secondary | ICD-10-CM

## 2013-11-21 NOTE — Telephone Encounter (Signed)
Received paperwork that Tradjenta was approved. Called his pharmacy to let them know to process the medication again.

## 2013-11-21 NOTE — Telephone Encounter (Signed)
Order entered

## 2013-11-21 NOTE — Telephone Encounter (Signed)
I entered orders for:  Lipid panel, LFTs, TSH, Hgba1c, renal function panel and CBC.  Are there any other orders that you would like entered?

## 2013-11-21 NOTE — Telephone Encounter (Signed)
PSA, thanks

## 2013-11-21 NOTE — Telephone Encounter (Signed)
Patient will be coming in sometime this week for cpe labs

## 2013-11-22 ENCOUNTER — Other Ambulatory Visit: Payer: Self-pay | Admitting: Family Medicine

## 2013-11-22 LAB — HEPATIC FUNCTION PANEL
ALK PHOS: 61 U/L (ref 39–117)
ALT: 21 U/L (ref 0–53)
AST: 14 U/L (ref 0–37)
Albumin: 4.6 g/dL (ref 3.5–5.2)
BILIRUBIN TOTAL: 0.4 mg/dL (ref 0.2–1.2)
Bilirubin, Direct: 0.1 mg/dL (ref 0.0–0.3)
Indirect Bilirubin: 0.3 mg/dL (ref 0.2–1.2)
TOTAL PROTEIN: 7 g/dL (ref 6.0–8.3)

## 2013-11-22 LAB — HEMOGLOBIN A1C
HEMOGLOBIN A1C: 5.9 % — AB (ref <5.7)
Mean Plasma Glucose: 123 mg/dL — ABNORMAL HIGH (ref <117)

## 2013-11-22 LAB — RENAL FUNCTION PANEL
ALBUMIN: 4.6 g/dL (ref 3.5–5.2)
BUN: 14 mg/dL (ref 6–23)
CO2: 28 mEq/L (ref 19–32)
CREATININE: 0.72 mg/dL (ref 0.50–1.35)
Calcium: 9.9 mg/dL (ref 8.4–10.5)
Chloride: 92 mEq/L — ABNORMAL LOW (ref 96–112)
GLUCOSE: 113 mg/dL — AB (ref 70–99)
Phosphorus: 3.8 mg/dL (ref 2.3–4.6)
Potassium: 4.8 mEq/L (ref 3.5–5.3)
Sodium: 131 mEq/L — ABNORMAL LOW (ref 135–145)

## 2013-11-22 LAB — CBC
HCT: 40.2 % (ref 39.0–52.0)
Hemoglobin: 14.9 g/dL (ref 13.0–17.0)
MCH: 32.1 pg (ref 26.0–34.0)
MCHC: 35.1 g/dL (ref 30.0–36.0)
MCV: 86.8 fL (ref 78.0–100.0)
PLATELETS: 363 10*3/uL (ref 150–400)
RBC: 4.64 MIL/uL (ref 4.22–5.81)
RDW: 13.2 % (ref 11.5–15.5)
WBC: 6.3 10*3/uL (ref 4.0–10.5)

## 2013-11-22 LAB — LIPID PANEL
CHOL/HDL RATIO: 3.3 ratio
Cholesterol: 154 mg/dL (ref 0–200)
HDL: 47 mg/dL (ref >39)
LDL Cholesterol: 86 mg/dL (ref 0–99)
TRIGLYCERIDES: 104 mg/dL (ref <150)
VLDL: 21 mg/dL (ref 0–40)

## 2013-11-23 LAB — PSA: PSA: 0.42 ng/mL (ref <=4.00)

## 2013-11-23 LAB — TSH: TSH: 0.718 u[IU]/mL (ref 0.350–4.500)

## 2013-11-24 ENCOUNTER — Encounter: Payer: BC Managed Care – PPO | Admitting: Family Medicine

## 2013-11-28 ENCOUNTER — Ambulatory Visit (INDEPENDENT_AMBULATORY_CARE_PROVIDER_SITE_OTHER): Payer: BC Managed Care – PPO | Admitting: Family Medicine

## 2013-11-28 ENCOUNTER — Telehealth: Payer: Self-pay | Admitting: Family Medicine

## 2013-11-28 ENCOUNTER — Encounter: Payer: Self-pay | Admitting: Family Medicine

## 2013-11-28 VITALS — BP 124/80 | HR 93 | Temp 98.3°F | Ht 69.0 in | Wt 223.0 lb

## 2013-11-28 DIAGNOSIS — I10 Essential (primary) hypertension: Secondary | ICD-10-CM

## 2013-11-28 DIAGNOSIS — F1011 Alcohol abuse, in remission: Secondary | ICD-10-CM | POA: Insufficient documentation

## 2013-11-28 DIAGNOSIS — M545 Low back pain, unspecified: Secondary | ICD-10-CM

## 2013-11-28 DIAGNOSIS — E782 Mixed hyperlipidemia: Secondary | ICD-10-CM

## 2013-11-28 DIAGNOSIS — E785 Hyperlipidemia, unspecified: Secondary | ICD-10-CM

## 2013-11-28 DIAGNOSIS — J209 Acute bronchitis, unspecified: Secondary | ICD-10-CM

## 2013-11-28 DIAGNOSIS — F17200 Nicotine dependence, unspecified, uncomplicated: Secondary | ICD-10-CM

## 2013-11-28 DIAGNOSIS — E871 Hypo-osmolality and hyponatremia: Secondary | ICD-10-CM

## 2013-11-28 DIAGNOSIS — E119 Type 2 diabetes mellitus without complications: Secondary | ICD-10-CM

## 2013-11-28 DIAGNOSIS — M79605 Pain in left leg: Secondary | ICD-10-CM

## 2013-11-28 DIAGNOSIS — Z0000 Encounter for general adult medical examination without abnormal findings: Secondary | ICD-10-CM

## 2013-11-28 DIAGNOSIS — F1010 Alcohol abuse, uncomplicated: Secondary | ICD-10-CM

## 2013-11-28 HISTORY — DX: Alcohol abuse, in remission: F10.11

## 2013-11-28 HISTORY — DX: Alcohol abuse, uncomplicated: F10.10

## 2013-11-28 LAB — RENAL FUNCTION PANEL
ALBUMIN: 5 g/dL (ref 3.5–5.2)
BUN: 14 mg/dL (ref 6–23)
CO2: 26 meq/L (ref 19–32)
Calcium: 10.4 mg/dL (ref 8.4–10.5)
Chloride: 91 mEq/L — ABNORMAL LOW (ref 96–112)
Creat: 0.77 mg/dL (ref 0.50–1.35)
GLUCOSE: 118 mg/dL — AB (ref 70–99)
POTASSIUM: 5.3 meq/L (ref 3.5–5.3)
Phosphorus: 4 mg/dL (ref 2.3–4.6)
SODIUM: 128 meq/L — AB (ref 135–145)

## 2013-11-28 MED ORDER — METOPROLOL SUCCINATE ER 200 MG PO TB24: 200.0000 mg | ORAL_TABLET | Freq: Every day | ORAL | Status: DC

## 2013-11-28 MED ORDER — SIMVASTATIN 10 MG PO TABS: 10.0000 mg | ORAL_TABLET | Freq: Every day | ORAL | Status: DC

## 2013-11-28 MED ORDER — CYCLOBENZAPRINE HCL 10 MG PO TABS: 10.0000 mg | ORAL_TABLET | Freq: Three times a day (TID) | ORAL | Status: DC | PRN

## 2013-11-28 MED ORDER — ALBUTEROL SULFATE HFA 108 (90 BASE) MCG/ACT IN AERS: 2.0000 | INHALATION_SPRAY | Freq: Four times a day (QID) | RESPIRATORY_TRACT | Status: DC | PRN

## 2013-11-28 MED ORDER — METFORMIN HCL 1000 MG PO TABS: 1000.0000 mg | ORAL_TABLET | Freq: Two times a day (BID) | ORAL | Status: DC

## 2013-11-28 MED ORDER — LINAGLIPTIN 5 MG PO TABS: 5.0000 mg | ORAL_TABLET | Freq: Every day | ORAL | Status: DC

## 2013-11-28 MED ORDER — LISINOPRIL-HYDROCHLOROTHIAZIDE 20-25 MG PO TABS: 1.0000 | ORAL_TABLET | Freq: Every day | ORAL | Status: DC

## 2013-11-28 NOTE — Assessment & Plan Note (Signed)
Patient encouraged to maintain heart healthy diet, regular exercise, adequate sleep. Consider daily probiotics. Take medications as prescribed 

## 2013-11-28 NOTE — Patient Instructions (Signed)
Can try Zantac or Pepcid when bad for Zantac can take up to 300 mg a day  Gastroesophageal Reflux Disease, Adult Gastroesophageal reflux disease (GERD) happens when acid from your stomach flows up into the esophagus. When acid comes in contact with the esophagus, the acid causes soreness (inflammation) in the esophagus. Over time, GERD may create small holes (ulcers) in the lining of the esophagus. CAUSES   Increased body weight. This puts pressure on the stomach, making acid rise from the stomach into the esophagus.  Smoking. This increases acid production in the stomach.  Drinking alcohol. This causes decreased pressure in the lower esophageal sphincter (valve or ring of muscle between the esophagus and stomach), allowing acid from the stomach into the esophagus.  Late evening meals and a full stomach. This increases pressure and acid production in the stomach.  A malformed lower esophageal sphincter. Sometimes, no cause is found. SYMPTOMS   Burning pain in the lower part of the mid-chest behind the breastbone and in the mid-stomach area. This may occur twice a week or more often.  Trouble swallowing.  Sore throat.  Dry cough.  Asthma-like symptoms including chest tightness, shortness of breath, or wheezing. DIAGNOSIS  Your caregiver may be able to diagnose GERD based on your symptoms. In some cases, X-rays and other tests may be done to check for complications or to check the condition of your stomach and esophagus. TREATMENT  Your caregiver may recommend over-the-counter or prescription medicines to help decrease acid production. Ask your caregiver before starting or adding any new medicines.  HOME CARE INSTRUCTIONS   Change the factors that you can control. Ask your caregiver for guidance concerning weight loss, quitting smoking, and alcohol consumption.  Avoid foods and drinks that make your symptoms worse, such as:  Caffeine or alcoholic drinks.  Chocolate.  Peppermint  or mint flavorings.  Garlic and onions.  Spicy foods.  Citrus fruits, such as oranges, lemons, or limes.  Tomato-based foods such as sauce, chili, salsa, and pizza.  Fried and fatty foods.  Avoid lying down for the 3 hours prior to your bedtime or prior to taking a nap.  Eat small, frequent meals instead of large meals.  Wear loose-fitting clothing. Do not wear anything tight around your waist that causes pressure on your stomach.  Raise the head of your bed 6 to 8 inches with wood blocks to help you sleep. Extra pillows will not help.  Only take over-the-counter or prescription medicines for pain, discomfort, or fever as directed by your caregiver.  Do not take aspirin, ibuprofen, or other nonsteroidal anti-inflammatory drugs (NSAIDs). SEEK IMMEDIATE MEDICAL CARE IF:   You have pain in your arms, neck, jaw, teeth, or back.  Your pain increases or changes in intensity or duration.  You develop nausea, vomiting, or sweating (diaphoresis).  You develop shortness of breath, or you faint.  Your vomit is green, yellow, black, or looks like coffee grounds or blood.  Your stool is red, bloody, or black. These symptoms could be signs of other problems, such as heart disease, gastric bleeding, or esophageal bleeding. MAKE SURE YOU:   Understand these instructions.  Will watch your condition.  Will get help right away if you are not doing well or get worse. Document Released: 05/14/2005 Document Revised: 10/27/2011 Document Reviewed: 02/21/2011 [facility] Patient Information 2014 [place], [place].

## 2013-11-28 NOTE — Assessment & Plan Note (Signed)
Encouraged complete cessation. Discussed need to quit as relates to risk of numerous cancers, cardiac and pulmonary disease as well as neurologic complications. Counseled for greater than 3 minutes 

## 2013-11-28 NOTE — Progress Notes (Signed)
Pre visit review using our clinic review tool, if applicable. No additional management support is needed unless otherwise documented below in the visit note. 

## 2013-11-28 NOTE — Assessment & Plan Note (Signed)
Well controlled, no changes to meds. Encouraged heart healthy diet such as the DASH diet and exercise as tolerated.  °

## 2013-11-28 NOTE — Assessment & Plan Note (Signed)
hgba1c acceptable, minimize simple carbs. Increase exercise as tolerated. Continue current meds 

## 2013-11-28 NOTE — Assessment & Plan Note (Signed)
Reports drinking roughly 4 beers on weekdays an das many as 12 on weekend days. Encouraged cut down if he is not ready to quit. Try to stick to 3 or less on weekends and titrate down to 6 or less on weekend days, he says he will try. Warned regarding health benefits of continuing

## 2013-11-28 NOTE — Telephone Encounter (Signed)
Lab order week of 05-20-2014 Return in about 6 months (around 05/30/2014) for follow up DM, HTN, hi chol, labs prior, lipid, renal, cbc, tsh, hepatic, hgba1c prior. ° °

## 2013-11-28 NOTE — Assessment & Plan Note (Signed)
Tolerating statin, encouraged heart healthy diet, avoid trans fats, minimize simple carbs and saturated fats. Increase exercise as tolerated 

## 2013-11-28 NOTE — Progress Notes (Signed)
Patient ID: Phillip Doyle, male   DOB: 1956/03/23, 58 y.o.   MRN: [id number] Phillip Doyle [id number] 11/23/1955 11/28/2013      Progress Note-Follow Up  Subjective  Chief Complaint  Chief Complaint  Patient presents with  . Annual Exam    HPI  Patient is a 58 year old male in today for routine medical care. He is doing fairly well at the moment. No recent illness. He denies any emergency room trips or acute concerns. Did have a stomach bug last month but it resolved. Had a couple episodes of diarrhea and heartburn. Unfortunately continues to smoke one to one and half packs per day. Blood sugars have been well-controlled and are often in the 120s and 130s in the morning and sometimes even lower during the day. Had his eye exam with his ophthalmologist coming up in June. No recent visual changes. Colonoscopy was in 2013. Denies CP/palp/SOB/HA/congestion/fevers/GI or GU c/o. Taking meds as prescribed  Past Medical History  Diagnosis Date  . Allergy     rhinitis  . Hypertension   . Obesity   . Ulcer 1982    peptic ulcer disease  . Impaired glucose tolerance   . Diabetes mellitus   . TOBACCO ABUSE 07/04/2010  . PEPTIC ULCER DISEASE 02/15/2007  . NISSEN FUNDOPLICATION, HX OF 3/47/4259  . Mixed hyperlipidemia 07/04/2010  . LUMBAR STRAIN 10/17/2010  . HYPERTENSION 02/15/2007  . EXOGENOUS OBESITY 02/15/2007  . DM 10/24/2010  . ALLERGIC RHINITIS 02/15/2007  . HTN (hypertension) 08/20/2011  . OA (osteoarthritis) of knee 02/17/2012  . ED (erectile dysfunction) 02/17/2012  . Bronchitis 07/14/2012  . Preventative health care 07/19/2012  . Personal history of colonic polyps 01/17/2013    colonoscoppy 2013, benign polyp advised repeat in 10 years    Past Surgical History  Procedure Laterality Date  . Rotator cuff repair  2000  . Vasectomy  1982  . Fundiplication for hh and reflux  1987  . Negative stress test  2008  . Colonoscopy  03/2012    Several hyperplastic polyps    Family History  Problem  Relation Age of Onset  . Asthma Mother   . Cancer Mother     uterine   . Alcohol abuse Father   . Stroke Maternal Grandmother     possibly  . Hypertension Maternal Grandfather   . Hypertension Brother   . Cancer Brother     prostate  . Colon cancer Neg Hx     History   Social History  . Marital Status: Married    Spouse Name: [name]    Number of Children: N/A  . Years of Education: N/A   Occupational History  .  [employer]   Social History Main Topics  . Smoking status: Current Every Day Smoker    Types: Cigarettes  . Smokeless tobacco: Former Systems developer    Quit date: 03/18/2012     Comment: quit 06/18/10- started back   . Alcohol Use: 0.0 oz/week    3-5 Cans of beer per week     Comment: 3-5 beer daily  . Drug Use: No  . Sexual Activity: Yes    Partners: Female   Other Topics Concern  . Not on file   Social History Narrative  . No narrative on file    Current Outpatient Prescriptions on File Prior to Visit  Medication Sig Dispense Refill  . albuterol (PROVENTIL HFA;VENTOLIN HFA) 108 (90 BASE) MCG/ACT inhaler Inhale 2 puffs into the lungs every 6 (six) hours as  needed for wheezing.  1 Inhaler  0  . cyclobenzaprine (FLEXERIL) 10 MG tablet Take 1 tablet (10 mg total) by mouth 3 (three) times daily as needed for muscle spasms.  30 tablet  0  . fish oil-omega-3 fatty acids 1000 MG capsule Take 2 g by mouth daily.        Marland Kitchen glucose blood test strip Check BS bid. DX: 250.00  100 each  1  . Lancets (FREESTYLE) lancets 1 each by Other route 2 (two) times daily. diag code 250.00  100 each  1  . lisinopril-hydrochlorothiazide (PRINZIDE,ZESTORETIC) 20-25 MG per tablet TAKE 1 TABLET BY MOUTH DAILY.  90 tablet  1  . metFORMIN (GLUCOPHAGE) 1000 MG tablet Take 1 tablet (1,000 mg total) by mouth 2 (two) times daily with a meal.  180 tablet  1  . metoprolol (TOPROL-XL) 200 MG 24 hr tablet TAKE 1 TABLET (200 MG TOTAL) BY MOUTH DAILY.  90 tablet  1  . Multiple Vitamins-Minerals (PA MENS 50 PLUS  VITAPAK PO) Take 1 capsule by mouth daily.      . simvastatin (ZOCOR) 10 MG tablet TAKE 1 TABLET BY MOUTH AT BEDTIME  30 tablet  2  . TRADJENTA 5 MG TABS tablet TAKE 1 TABLET BY MOUTH EVERY DAY  30 tablet  3   No current facility-administered medications on file prior to visit.    No Known Allergies  Review of Systems  Review of Systems  Constitutional: Negative for fever, chills and malaise/fatigue.  HENT: Negative for congestion, hearing loss and nosebleeds.   Eyes: Negative for discharge.  Respiratory: Negative for cough, sputum production, shortness of breath and wheezing.   Cardiovascular: Negative for chest pain, palpitations and leg swelling.  Gastrointestinal: Positive for heartburn and nausea. Negative for vomiting, abdominal pain, diarrhea, constipation and blood in stool.  Genitourinary: Negative for dysuria, urgency, frequency and hematuria.  Musculoskeletal: Negative for back pain, falls and myalgias.  Skin: Negative for rash.  Neurological: Negative for dizziness, tremors, sensory change, focal weakness, loss of consciousness, weakness and headaches.  Endo/Heme/Allergies: Negative for polydipsia. Does not bruise/bleed easily.  Psychiatric/Behavioral: Negative for depression and suicidal ideas. The patient is not nervous/anxious and does not have insomnia.     Objective  BP 124/80  Pulse 93  Temp(Src) 98.3 F (36.8 C) (Oral)  Ht 5\' 9"  (1.753 m)  Wt 223 lb (101.152 kg)  BMI 32.92 kg/m2  SpO2 95%  Physical Exam  Physical Exam  Constitutional: He is oriented to person, place, and time and well-developed, well-nourished, and in no distress. No distress.  HENT:  Head: Normocephalic and atraumatic.  Eyes: Conjunctivae are normal.  Neck: Neck supple. No thyromegaly present.  Cardiovascular: Normal rate, regular rhythm and normal heart sounds.   No murmur heard. Pulmonary/Chest: Effort normal and breath sounds normal. No respiratory distress.  Abdominal: He  exhibits no distension and no mass. There is no tenderness.  Musculoskeletal: He exhibits no edema.  Neurological: He is alert and oriented to person, place, and time.  Skin: Skin is warm.  Psychiatric: Memory, affect and judgment normal.    Lab Results  Component Value Date   TSH 0.718 11/21/2013   Lab Results  Component Value Date   WBC 6.3 11/21/2013   HGB 14.9 11/21/2013   HCT 40.2 11/21/2013   MCV 86.8 11/21/2013   PLT 363 11/21/2013   Lab Results  Component Value Date   CREATININE 0.72 11/21/2013   BUN 14 11/21/2013   NA 131* 11/21/2013  K 4.8 11/21/2013   CL 92* 11/21/2013   CO2 28 11/21/2013   Lab Results  Component Value Date   ALT 21 11/21/2013   AST 14 11/21/2013   ALKPHOS 61 11/21/2013   BILITOT 0.4 11/21/2013   Lab Results  Component Value Date   CHOL 154 11/21/2013   Lab Results  Component Value Date   HDL 47 11/21/2013   Lab Results  Component Value Date   LDLCALC 86 11/21/2013   Lab Results  Component Value Date   TRIG 104 11/21/2013   Lab Results  Component Value Date   CHOLHDL 3.3 11/21/2013     Assessment & Plan  MIXED HYPERLIPIDEMIA Tolerating statin, encouraged heart healthy diet, avoid trans fats, minimize simple carbs and saturated fats. Increase exercise as tolerated.   TOBACCO ABUSE Encouraged complete cessation. Discussed need to quit as relates to risk of numerous cancers, cardiac and pulmonary disease as well as neurologic complications. Counseled for greater than 3 minutes  HTN (hypertension) Well controlled, no changes to meds. Encouraged heart healthy diet such as the DASH diet and exercise as tolerated.   DM hgba1c acceptable, minimize simple carbs. Increase exercise as tolerated. Continue current meds  Preventative health care Patient encouraged to maintain heart healthy diet, regular exercise, adequate sleep. Consider daily probiotics. Take medications as prescribed  Alcohol abuse Reports drinking roughly 4 beers on weekdays an das many as 12 on  weekend days. Encouraged cut down if he is not ready to quit. Try to stick to 3 or less on weekends and titrate down to 6 or less on weekend days, he says he will try. Warned regarding health benefits of continuing

## 2013-11-28 NOTE — Telephone Encounter (Signed)
Relevant patient education assigned to patient using Emmi. ° °

## 2013-11-28 NOTE — Telephone Encounter (Signed)
Lab order week of 05-20-2014 Return in about 6 months (around 05/30/2014) for follow up DM, HTN, hi chol, labs prior, lipid, renal, cbc, tsh, hepatic, hgba1c prior.

## 2013-11-29 ENCOUNTER — Telehealth: Payer: Self-pay

## 2013-11-29 ENCOUNTER — Telehealth: Payer: Self-pay | Admitting: Family Medicine

## 2013-11-29 DIAGNOSIS — E871 Hypo-osmolality and hyponatremia: Secondary | ICD-10-CM

## 2013-11-29 NOTE — Telephone Encounter (Signed)
Message copied by Varney Daily on Tue Nov 29, 2013  9:20 AM ------      Message from: Penni Homans A      Created: Mon Nov 28, 2013 10:03 PM       Notify sodium lower, needs to decrease water and alcohol more and recheck renal panel next week. Watch for headaches and confusion.  ------

## 2013-11-29 NOTE — Telephone Encounter (Signed)
Patient informed and labs ordered

## 2013-11-29 NOTE — Telephone Encounter (Signed)
Relevant patient education assigned to patient using Emmi. ° °

## 2013-12-12 ENCOUNTER — Telehealth: Payer: Self-pay

## 2013-12-12 NOTE — Telephone Encounter (Signed)
Relevant patient education assigned to patient using Emmi. ° °

## 2013-12-16 ENCOUNTER — Ambulatory Visit (INDEPENDENT_AMBULATORY_CARE_PROVIDER_SITE_OTHER): Payer: BC Managed Care – PPO

## 2013-12-16 VITALS — BP 169/88 | HR 84 | Resp 16

## 2013-12-16 DIAGNOSIS — L600 Ingrowing nail: Secondary | ICD-10-CM

## 2013-12-16 DIAGNOSIS — B351 Tinea unguium: Secondary | ICD-10-CM

## 2013-12-16 DIAGNOSIS — S90129A Contusion of unspecified lesser toe(s) without damage to nail, initial encounter: Secondary | ICD-10-CM

## 2013-12-16 MED ORDER — CEPHALEXIN 500 MG PO CAPS: 500.0000 mg | ORAL_CAPSULE | Freq: Three times a day (TID) | ORAL | Status: DC

## 2013-12-16 NOTE — Progress Notes (Signed)
   Subjective:    Patient ID: Phillip Doyle, male    DOB: 07/21/1956, 58 y.o.   MRN: [id number]  HPI Comments: "I have a thick toenail"  Patient c/o tenderness 1st right toe for several months. The toenail is thick and discolored. He states that he injured the toe years ago as a child by dropping something on the toe. Since, the nail has gotten thicker and darker. He tries to trim and file down, but gotten where he can't anymore. Pain is worse with shoes.      Review of Systems  All other systems reviewed and are negative.      Objective:   Physical Exam Lower extremity objective findings as follows vascular status is intact with pedal pulses palpable DP and PT +2/4 bilateral Refill timed 3-4 seconds skin temperature warm turgor normal no edema rubor pallor or varicosities noted neurologically epicritic and proprioceptive sensations intact and symmetric is normal plantar response DTRs not listed neurologically skin color pigment and hair growth are normal there is dystrophy of the hallux nails bilateral right significant worse than left and this would have trauma years ago nails yellowed thickened brown discolored friable and painful tender palpation in ingrowing left hallux also shows some thickening yellowing may be treated topically with antifungal in the future. Likely suggest topical Fungi-Nail. Orthopedic biomechanical exam unremarkable rectus foot type no open wounds or ulcerations are noted no secondary infection is noted       Assessment & Plan:  Assessment this time is ingrowing nail with onychomycosis and history of contusion of right hallux nail plate this time per patient request my recommendation is an excision of nail plate with phenol matricectomy risk of his ulcers review this time local block is administered Betadine prep performed the nail plate is avulsed phenol meniscectomy for a Darco wash Silvadene cream and gauze dressing reapplied to the left great toe progression right  great toe patient given instructions for soaking and Betadine warm water and Tylenol as he for pain prescription for cephalexin also 40 to pharmacy 500 mg 3 times a day x7 days return in 2-3 weeks for followup and nail check contact us if any changes or exacerbations in the interim. Next  Harriet Masson DP

## 2013-12-16 NOTE — Patient Instructions (Signed)
Betadine Soak Instructions  Purchase an 8 oz. bottle of BETADINE solution (Povidone)  THE DAY AFTER THE PROCEDURE  Place 1 tablespoon of betadine solution in a quart of warm tap water.  Submerge your foot or feet with outer bandage intact for the initial soak; this will allow the bandage to become moist and wet for easy lift off.  Once you remove your bandage, continue to soak in the solution for 20 minutes.  This soak should be done twice a day.  Next, remove your foot or feet from solution, blot dry the affected area and cover.  You may use a band aid large enough to cover the area or use gauze and tape.  Apply other medications to the area as directed by the doctor such as cortisporin otic solution (ear drops) or neosporin.  IF YOUR SKIN BECOMES IRRITATED WHILE USING THESE INSTRUCTIONS, IT IS OKAY TO SWITCH TO EPSOM SALTS AND WATER OR WHITE VINEGAR AND WATER.  Recommendations for pain: Take 2 Tylenol or 2 Advil 3 times daily as needed for pain

## 2014-01-06 ENCOUNTER — Ambulatory Visit (INDEPENDENT_AMBULATORY_CARE_PROVIDER_SITE_OTHER): Payer: BC Managed Care – PPO

## 2014-01-06 VITALS — BP 144/71 | HR 88 | Resp 16

## 2014-01-06 DIAGNOSIS — Z09 Encounter for follow-up examination after completed treatment for conditions other than malignant neoplasm: Secondary | ICD-10-CM

## 2014-01-06 DIAGNOSIS — L600 Ingrowing nail: Secondary | ICD-10-CM

## 2014-01-06 DIAGNOSIS — S90129A Contusion of unspecified lesser toe(s) without damage to nail, initial encounter: Secondary | ICD-10-CM

## 2014-01-06 NOTE — Patient Instructions (Signed)

## 2014-01-06 NOTE — Progress Notes (Signed)
   Subjective:    Patient ID: Phillip Doyle, male    DOB: October 01, 1955, 58 y.o.   MRN: [id number]  HPI Comments: "It is good, no problems with it"  Follow up AP nail - total nail removal - 1st toe right   Toe Pain       Review of Systems no new findings or systemic changes noted    Objective:   Physical Exam Neurovascular status intact status post AP nail procedure avulsion and he'll matricectomy of the right hallux nail bed doing well there is slight pink granular base the nailbed minimal serous discharge drainage noted no ascending psoas lymphangitis no excessive pain tenderness discomfort assessment good postop progress patient replaced at this time.       Assessment & Plan:  Assessment good postop progress following AP nail procedure removal of mycotic deformed nail patient discharge to an as-needed basis for followup maintain Neosporin and Band-Aid dressing after soaking soap and water daily under day later dry at night contact us if there's he fails to resolve completely heal within the next month  Harriet Masson DPM

## 2014-03-01 ENCOUNTER — Other Ambulatory Visit: Payer: Self-pay | Admitting: Family Medicine

## 2014-03-01 ENCOUNTER — Telehealth: Payer: Self-pay

## 2014-03-01 NOTE — Telephone Encounter (Signed)
Diabetic Bundle:  Patient informed that he needs a nurse visit to check BP .  appt scheduled for 03-11-14 at 10:45 am  Pt voiced understanding

## 2014-03-06 ENCOUNTER — Ambulatory Visit (INDEPENDENT_AMBULATORY_CARE_PROVIDER_SITE_OTHER): Payer: BC Managed Care – PPO | Admitting: Family

## 2014-03-06 ENCOUNTER — Telehealth: Payer: Self-pay | Admitting: Family Medicine

## 2014-03-06 VITALS — BP 132/76 | HR 91 | Temp 98.5°F | Resp 18

## 2014-03-06 DIAGNOSIS — I10 Essential (primary) hypertension: Secondary | ICD-10-CM

## 2014-03-06 NOTE — Assessment & Plan Note (Signed)
BP stable on zestoretic and metoprolol. Continue same. Pt has follow up scheduled with Dr. [name] in October.

## 2014-03-06 NOTE — Telephone Encounter (Signed)
Relevant patient education assigned to patient using Emmi. ° °

## 2014-04-14 LAB — HM DIABETES EYE EXAM

## 2014-04-18 ENCOUNTER — Encounter: Payer: Self-pay | Admitting: Family Medicine

## 2014-05-09 ENCOUNTER — Other Ambulatory Visit: Payer: Self-pay | Admitting: Family Medicine

## 2014-05-29 ENCOUNTER — Ambulatory Visit (INDEPENDENT_AMBULATORY_CARE_PROVIDER_SITE_OTHER): Payer: BC Managed Care – PPO | Admitting: Family Medicine

## 2014-05-29 ENCOUNTER — Encounter: Payer: Self-pay | Admitting: Family Medicine

## 2014-05-29 VITALS — BP 140/74 | HR 87 | Temp 99.0°F | Ht 69.0 in | Wt 230.2 lb

## 2014-05-29 DIAGNOSIS — E1169 Type 2 diabetes mellitus with other specified complication: Secondary | ICD-10-CM

## 2014-05-29 DIAGNOSIS — F1010 Alcohol abuse, uncomplicated: Secondary | ICD-10-CM

## 2014-05-29 DIAGNOSIS — E669 Obesity, unspecified: Secondary | ICD-10-CM

## 2014-05-29 DIAGNOSIS — I10 Essential (primary) hypertension: Secondary | ICD-10-CM

## 2014-05-29 DIAGNOSIS — E119 Type 2 diabetes mellitus without complications: Secondary | ICD-10-CM

## 2014-05-29 DIAGNOSIS — Z720 Tobacco use: Secondary | ICD-10-CM

## 2014-05-29 DIAGNOSIS — Z23 Encounter for immunization: Secondary | ICD-10-CM

## 2014-05-29 DIAGNOSIS — F17200 Nicotine dependence, unspecified, uncomplicated: Secondary | ICD-10-CM

## 2014-05-29 DIAGNOSIS — E782 Mixed hyperlipidemia: Secondary | ICD-10-CM

## 2014-05-29 LAB — RENAL FUNCTION PANEL
Albumin: 4.6 g/dL (ref 3.5–5.2)
BUN: 13 mg/dL (ref 6–23)
CO2: 24 mEq/L (ref 19–32)
CREATININE: 0.8 mg/dL (ref 0.4–1.5)
Calcium: 9.7 mg/dL (ref 8.4–10.5)
Chloride: 93 mEq/L — ABNORMAL LOW (ref 96–112)
GFR: 107.46 mL/min (ref >60.00)
Glucose, Bld: 110 mg/dL — ABNORMAL HIGH (ref 70–99)
PHOSPHORUS: 3.6 mg/dL (ref 2.3–4.6)
Potassium: 4.3 mEq/L (ref 3.5–5.1)
SODIUM: 129 meq/L — AB (ref 135–145)

## 2014-05-29 LAB — HEPATIC FUNCTION PANEL
ALBUMIN: 4.6 g/dL (ref 3.5–5.2)
ALK PHOS: 57 U/L (ref 39–117)
ALT: 31 U/L (ref 0–53)
AST: 23 U/L (ref 0–37)
Bilirubin, Direct: 0 mg/dL (ref 0.0–0.3)
TOTAL PROTEIN: 8.6 g/dL — AB (ref 6.0–8.3)
Total Bilirubin: 0.7 mg/dL (ref 0.2–1.2)

## 2014-05-29 LAB — CBC
HEMATOCRIT: 48.3 % (ref 39.0–52.0)
Hemoglobin: 16.2 g/dL (ref 13.0–17.0)
MCHC: 33.5 g/dL (ref 30.0–36.0)
MCV: 95.9 fl (ref 78.0–100.0)
Platelets: 347 10*3/uL (ref 150.0–400.0)
RBC: 5.04 Mil/uL (ref 4.22–5.81)
RDW: 12.7 % (ref 11.5–15.5)
WBC: 11.4 10*3/uL — ABNORMAL HIGH (ref 4.0–10.5)

## 2014-05-29 LAB — HEMOGLOBIN A1C: Hgb A1c MFr Bld: 6.1 % (ref 4.6–6.5)

## 2014-05-29 LAB — LIPID PANEL
Cholesterol: 135 mg/dL (ref 0–200)
HDL: 45.9 mg/dL (ref >39.00)
LDL CALC: 69 mg/dL (ref 0–99)
NONHDL: 89.1
Total CHOL/HDL Ratio: 3
Triglycerides: 101 mg/dL (ref 0.0–149.0)
VLDL: 20.2 mg/dL (ref 0.0–40.0)

## 2014-05-29 LAB — TSH: TSH: 0.36 u[IU]/mL (ref 0.35–4.50)

## 2014-05-29 MED ORDER — LISINOPRIL-HYDROCHLOROTHIAZIDE 20-12.5 MG PO TABS: 1.0000 | ORAL_TABLET | Freq: Two times a day (BID) | ORAL | Status: DC

## 2014-05-29 NOTE — Patient Instructions (Signed)

## 2014-05-29 NOTE — Assessment & Plan Note (Signed)
Encouraged complete cessation. Discussed need to quit as relates to risk of numerous cancers, cardiac and pulmonary disease as well as neurologic complications. Counseled for greater than 3 minutes. Failed ecigs, patches, meds, gum. Encouraged to try again

## 2014-05-29 NOTE — Assessment & Plan Note (Signed)
Tolerating statin, encouraged heart healthy diet, avoid trans fats, minimize simple carbs and saturated fats. Increase exercise as tolerated 

## 2014-05-29 NOTE — Progress Notes (Signed)
Pre visit review using our clinic review tool, if applicable. No additional management support is needed unless otherwise documented below in the visit note. 

## 2014-05-29 NOTE — Assessment & Plan Note (Signed)
hgba1c acceptable, minimize simple carbs. Increase exercise as tolerated. Continue current meds. Recheck hgba1c today.

## 2014-06-04 ENCOUNTER — Encounter: Payer: Self-pay | Admitting: Family Medicine

## 2014-06-04 NOTE — Progress Notes (Signed)
Patient ID: Phillip Doyle, male   DOB: [birth year], 58 y.o.   MRN: [id number] Phillip Doyle [id number] 05/17/1956 06/04/2014      Progress Note-Follow Up  Subjective  Chief Complaint  Chief Complaint  Patient presents with  . Follow-up    6 month  . Injections    flu    HPI  Patient is a 58 year old male in today for routine medical care. Patient is in today for followup reports doing fairly well. Has cut down to just 2 beers per day. Unfortunately continues to smoke. No recent illness. No polyuria or polydipsia. Is routinely following a diabetic diet. Denies CP/palp/SOB/HA/congestion/fevers/GI or GU c/o. Taking meds as prescribed  Past Medical History  Diagnosis Date  . Allergy     rhinitis  . Hypertension   . Obesity   . Ulcer 1982    peptic ulcer disease  . Impaired glucose tolerance   . Diabetes mellitus   . TOBACCO ABUSE 07/04/2010  . PEPTIC ULCER DISEASE 02/15/2007  . NISSEN FUNDOPLICATION, HX OF 08/18/7508  . Mixed hyperlipidemia 07/04/2010  . LUMBAR STRAIN 10/17/2010  . HYPERTENSION 02/15/2007  . EXOGENOUS OBESITY 02/15/2007  . DM 10/24/2010  . ALLERGIC RHINITIS 02/15/2007  . HTN (hypertension) 08/20/2011  . OA (osteoarthritis) of knee 02/17/2012  . ED (erectile dysfunction) 02/17/2012  . Bronchitis 07/14/2012  . Preventative health care 07/19/2012  . Personal history of colonic polyps 01/17/2013    colonoscoppy 2013, benign polyp advised repeat in 10 years  . Alcohol abuse 11/28/2013    Past Surgical History  Procedure Laterality Date  . Rotator cuff repair  2000  . Vasectomy  1982  . Fundiplication for hh and reflux  1987  . Negative stress test  2008  . Colonoscopy  03/2012    Several hyperplastic polyps    Family History  Problem Relation Age of Onset  . Asthma Mother   . Cancer Mother     uterine   . Alcohol abuse Father   . Stroke Maternal Grandmother     possibly  . Hypertension Maternal Grandfather   . Hypertension Brother   . Cancer Brother     prostate   . Colon cancer Neg Hx     History   Social History  . Marital Status: Married    Spouse Name: [name]    Number of Children: N/A  . Years of Education: N/A   Occupational History  .  [employer]   Social History Main Topics  . Smoking status: Current Every Day Smoker    Types: Cigarettes  . Smokeless tobacco: Former Systems developer    Quit date: 03/18/2012     Comment: quit 06/18/10- started back   . Alcohol Use: 0.0 oz/week    3-5 Cans of beer per week     Comment: 3-5 beer daily  . Drug Use: No  . Sexual Activity: Yes    Partners: Female   Other Topics Concern  . Not on file   Social History Narrative  . No narrative on file    Current Outpatient Prescriptions on File Prior to Visit  Medication Sig Dispense Refill  . albuterol (PROVENTIL HFA;VENTOLIN HFA) 108 (90 BASE) MCG/ACT inhaler Inhale 2 puffs into the lungs every 6 (six) hours as needed for wheezing.  3 Inhaler  3  . cyclobenzaprine (FLEXERIL) 10 MG tablet Take 1 tablet (10 mg total) by mouth 3 (three) times daily as needed for muscle spasms.  30 tablet  1  . fish  oil-omega-3 fatty acids 1000 MG capsule Take 2 g by mouth daily.        Marland Kitchen glucose blood test strip Check BS bid. DX: 250.00  100 each  1  . Lancets (FREESTYLE) lancets 1 each by Other route 2 (two) times daily. diag code 250.00  100 each  1  . metFORMIN (GLUCOPHAGE) 1000 MG tablet Take 1 tablet (1,000 mg total) by mouth 2 (two) times daily with a meal.  180 tablet  3  . metoprolol (TOPROL-XL) 200 MG 24 hr tablet Take 1 tablet (200 mg total) by mouth daily.  90 tablet  3  . Multiple Vitamins-Minerals (PA MENS 50 PLUS VITAPAK PO) Take 1 capsule by mouth daily.      . simvastatin (ZOCOR) 10 MG tablet Take 1 tablet (10 mg total) by mouth daily at 6 PM.  90 tablet  3  . TRADJENTA 5 MG TABS tablet TAKE 1 TABLET BY MOUTH EVERY DAY  30 tablet  3   No current facility-administered medications on file prior to visit.    No Known Allergies  Review of Systems  Review of  Systems  Constitutional: Negative for fever and malaise/fatigue.  HENT: Negative for congestion.   Eyes: Negative for discharge.  Respiratory: Negative for shortness of breath.   Cardiovascular: Negative for chest pain, palpitations and leg swelling.  Gastrointestinal: Negative for nausea, abdominal pain and diarrhea.  Genitourinary: Negative for dysuria.  Musculoskeletal: Negative for falls.  Skin: Negative for rash.  Neurological: Negative for loss of consciousness and headaches.  Endo/Heme/Allergies: Negative for polydipsia.  Psychiatric/Behavioral: Negative for depression and suicidal ideas. The patient is not nervous/anxious and does not have insomnia.     Objective  BP 140/74  Pulse 87  Temp(Src) 99 F (37.2 C) (Oral)  Ht 5\' 9"  (1.753 m)  Wt 230 lb 3.2 oz (104.418 kg)  BMI 33.98 kg/m2  SpO2 97%  Physical Exam  Physical Exam  Constitutional: He is oriented to person, place, and time and well-developed, well-nourished, and in no distress. No distress.  HENT:  Head: Normocephalic and atraumatic.  Eyes: Conjunctivae are normal.  Neck: Neck supple. No thyromegaly present.  Cardiovascular: Normal rate, regular rhythm and normal heart sounds.   No murmur heard. Pulmonary/Chest: Effort normal and breath sounds normal. No respiratory distress.  Abdominal: He exhibits no distension and no mass. There is no tenderness.  Musculoskeletal: He exhibits no edema.  Neurological: He is alert and oriented to person, place, and time.  Skin: Skin is warm.  Psychiatric: Memory, affect and judgment normal.    Lab Results  Component Value Date   TSH 0.36 05/29/2014   Lab Results  Component Value Date   WBC 11.4* 05/29/2014   HGB 16.2 05/29/2014   HCT 48.3 05/29/2014   MCV 95.9 05/29/2014   PLT 347.0 05/29/2014   Lab Results  Component Value Date   CREATININE 0.8 05/29/2014   BUN 13 05/29/2014   NA 129* 05/29/2014   K 4.3 05/29/2014   CL 93* 05/29/2014   CO2 24 05/29/2014    Lab Results  Component Value Date   ALT 31 05/29/2014   AST 23 05/29/2014   ALKPHOS 57 05/29/2014   BILITOT 0.7 05/29/2014   Lab Results  Component Value Date   CHOL 135 05/29/2014   Lab Results  Component Value Date   HDL 45.90 05/29/2014   Lab Results  Component Value Date   LDLCALC 69 05/29/2014   Lab Results  Component Value Date  TRIG 101.0 05/29/2014   Lab Results  Component Value Date   CHOLHDL 3 05/29/2014     Assessment & Plan  TOBACCO ABUSE Encouraged complete cessation. Discussed need to quit as relates to risk of numerous cancers, cardiac and pulmonary disease as well as neurologic complications. Counseled for greater than 3 minutes. Failed ecigs, patches, meds, gum. Encouraged to try again  MIXED HYPERLIPIDEMIA Tolerating statin, encouraged heart healthy diet, avoid trans fats, minimize simple carbs and saturated fats. Increase exercise as tolerated  Diabetes mellitus type 2 in obese hgba1c acceptable, minimize simple carbs. Increase exercise as tolerated. Continue current meds. Recheck hgba1c today.   HTN (hypertension) Well controlled, no changes to meds. Encouraged heart healthy diet such as the DASH diet and exercise as tolerated.   Obesity Encouraged DASH diet, decrease po intake and increase exercise as tolerated. Needs 7-8 hours of sleep nightly. Avoid trans fats, eat small, frequent meals every 4-5 hours with lean proteins, complex carbs and healthy fats. Minimize simple carbs, GMO foods.  Alcohol abuse Has decreased to 2 beers daily and is encouraged to consider cutting down further to control his sugar.

## 2014-06-04 NOTE — Assessment & Plan Note (Signed)
Encouraged DASH diet, decrease po intake and increase exercise as tolerated. Needs 7-8 hours of sleep nightly. Avoid trans fats, eat small, frequent meals every 4-5 hours with lean proteins, complex carbs and healthy fats. Minimize simple carbs, GMO foods. 

## 2014-06-04 NOTE — Assessment & Plan Note (Signed)
Well controlled, no changes to meds. Encouraged heart healthy diet such as the DASH diet and exercise as tolerated.  °

## 2014-06-04 NOTE — Assessment & Plan Note (Signed)
Has decreased to 2 beers daily and is encouraged to consider cutting down further to control his sugar.

## 2014-07-07 ENCOUNTER — Telehealth: Payer: Self-pay | Admitting: Family Medicine

## 2014-07-07 NOTE — Telephone Encounter (Signed)
Caller name:  Zyrion Relation to pt: self Call back number: [phone] Pharmacy[facility] [address]  Reason for call:   Patient wants an rx for chantix. Patient states that "it's time to quit"

## 2014-07-08 ENCOUNTER — Other Ambulatory Visit: Payer: Self-pay | Admitting: Family Medicine

## 2014-07-08 MED ORDER — VARENICLINE TARTRATE 1 MG PO TABS: 1.0000 mg | ORAL_TABLET | Freq: Two times a day (BID) | ORAL | Status: DC

## 2014-07-08 MED ORDER — VARENICLINE TARTRATE 0.5 MG X 11 & 1 MG X 42 PO MISC: ORAL | Status: DC

## 2014-07-08 NOTE — Telephone Encounter (Signed)
Notify I called in the Chantix, just let him know that if he starts it and he has increased irritability or depression he has to stop the med and let me know.

## 2014-07-10 NOTE — Telephone Encounter (Signed)
Pt informed and rx sent 

## 2014-08-21 ENCOUNTER — Other Ambulatory Visit (INDEPENDENT_AMBULATORY_CARE_PROVIDER_SITE_OTHER): Payer: BC Managed Care – PPO

## 2014-08-21 DIAGNOSIS — E669 Obesity, unspecified: Secondary | ICD-10-CM

## 2014-08-21 DIAGNOSIS — Z720 Tobacco use: Secondary | ICD-10-CM

## 2014-08-21 DIAGNOSIS — F17200 Nicotine dependence, unspecified, uncomplicated: Secondary | ICD-10-CM

## 2014-08-21 DIAGNOSIS — I10 Essential (primary) hypertension: Secondary | ICD-10-CM

## 2014-08-21 DIAGNOSIS — E782 Mixed hyperlipidemia: Secondary | ICD-10-CM

## 2014-08-21 DIAGNOSIS — E119 Type 2 diabetes mellitus without complications: Secondary | ICD-10-CM

## 2014-08-21 DIAGNOSIS — E1169 Type 2 diabetes mellitus with other specified complication: Secondary | ICD-10-CM

## 2014-08-21 LAB — RENAL FUNCTION PANEL
ALBUMIN: 4.6 g/dL (ref 3.5–5.2)
BUN: 17 mg/dL (ref 6–23)
CHLORIDE: 96 meq/L (ref 96–112)
CO2: 29 mEq/L (ref 19–32)
Calcium: 10 mg/dL (ref 8.4–10.5)
Creatinine, Ser: 0.8 mg/dL (ref 0.4–1.5)
GFR: 102.85 mL/min (ref >60.00)
Glucose, Bld: 128 mg/dL — ABNORMAL HIGH (ref 70–99)
PHOSPHORUS: 4.5 mg/dL (ref 2.3–4.6)
Potassium: 4.4 mEq/L (ref 3.5–5.1)
SODIUM: 135 meq/L (ref 135–145)

## 2014-08-21 LAB — HEPATIC FUNCTION PANEL
ALBUMIN: 4.6 g/dL (ref 3.5–5.2)
ALT: 33 U/L (ref 0–53)
AST: 19 U/L (ref 0–37)
Alkaline Phosphatase: 54 U/L (ref 39–117)
BILIRUBIN TOTAL: 0.4 mg/dL (ref 0.2–1.2)
Bilirubin, Direct: 0 mg/dL (ref 0.0–0.3)
Total Protein: 7.5 g/dL (ref 6.0–8.3)

## 2014-08-21 LAB — CBC
HCT: 41.8 % (ref 39.0–52.0)
Hemoglobin: 14.1 g/dL (ref 13.0–17.0)
MCHC: 33.8 g/dL (ref 30.0–36.0)
MCV: 94.8 fl (ref 78.0–100.0)
PLATELETS: 343 10*3/uL (ref 150.0–400.0)
RBC: 4.41 Mil/uL (ref 4.22–5.81)
RDW: 13.2 % (ref 11.5–15.5)
WBC: 7.8 10*3/uL (ref 4.0–10.5)

## 2014-08-21 LAB — LIPID PANEL
Cholesterol: 192 mg/dL (ref 0–200)
HDL: 40.9 mg/dL (ref >39.00)
LDL Cholesterol: 120 mg/dL — ABNORMAL HIGH (ref 0–99)
NONHDL: 151.1
Total CHOL/HDL Ratio: 5
Triglycerides: 158 mg/dL — ABNORMAL HIGH (ref 0.0–149.0)
VLDL: 31.6 mg/dL (ref 0.0–40.0)

## 2014-08-21 LAB — HEMOGLOBIN A1C: Hgb A1c MFr Bld: 6.7 % — ABNORMAL HIGH (ref 4.6–6.5)

## 2014-08-21 LAB — TSH: TSH: 2.27 u[IU]/mL (ref 0.35–4.50)

## 2014-08-28 ENCOUNTER — Encounter: Payer: Self-pay | Admitting: Family Medicine

## 2014-08-28 ENCOUNTER — Ambulatory Visit (INDEPENDENT_AMBULATORY_CARE_PROVIDER_SITE_OTHER): Payer: BC Managed Care – PPO | Admitting: Family Medicine

## 2014-08-28 VITALS — BP 132/80 | HR 84 | Temp 98.3°F | Ht 69.0 in | Wt 241.0 lb

## 2014-08-28 DIAGNOSIS — Z0000 Encounter for general adult medical examination without abnormal findings: Secondary | ICD-10-CM

## 2014-08-28 DIAGNOSIS — E782 Mixed hyperlipidemia: Secondary | ICD-10-CM

## 2014-08-28 DIAGNOSIS — E119 Type 2 diabetes mellitus without complications: Secondary | ICD-10-CM

## 2014-08-28 DIAGNOSIS — E669 Obesity, unspecified: Secondary | ICD-10-CM

## 2014-08-28 DIAGNOSIS — E1169 Type 2 diabetes mellitus with other specified complication: Secondary | ICD-10-CM

## 2014-08-28 DIAGNOSIS — I10 Essential (primary) hypertension: Secondary | ICD-10-CM

## 2014-08-28 DIAGNOSIS — Z720 Tobacco use: Secondary | ICD-10-CM

## 2014-08-28 DIAGNOSIS — M25512 Pain in left shoulder: Secondary | ICD-10-CM

## 2014-08-28 DIAGNOSIS — E785 Hyperlipidemia, unspecified: Secondary | ICD-10-CM

## 2014-08-28 DIAGNOSIS — F17200 Nicotine dependence, unspecified, uncomplicated: Secondary | ICD-10-CM

## 2014-08-28 NOTE — Patient Instructions (Signed)

## 2014-08-28 NOTE — Progress Notes (Signed)
Phillip Doyle  [id number] [birth year] 08/28/2014      Progress Note-Follow Up  Subjective  Chief Complaint  Chief Complaint  Patient presents with  . Follow-up    3 mos    HPI  Patient is a 59 y.o. male in today for routine medical care. Patient is in today for follow-up. He reports he has not smoked any cigarettes for 2 months now. Chantix has been able to help improve his cravings. This is the longest period of time he's gone without cigarettes and quit some time. He reports he feels better. Denies any respiratory concerns. Has no acute concerns or illness to discuss. Denies CP/palp/SOB/HA/congestion/fevers/GI or GU c/o. Taking meds as prescribed  Past Medical History  Diagnosis Date  . Allergy     rhinitis  . Hypertension   . Obesity   . Ulcer 1982    peptic ulcer disease  . Impaired glucose tolerance   . Diabetes mellitus   . TOBACCO ABUSE 07/04/2010  . PEPTIC ULCER DISEASE 02/15/2007  . NISSEN FUNDOPLICATION, HX OF 0/86/7619  . Mixed hyperlipidemia 07/04/2010  . LUMBAR STRAIN 10/17/2010  . HYPERTENSION 02/15/2007  . EXOGENOUS OBESITY 02/15/2007  . DM 10/24/2010  . ALLERGIC RHINITIS 02/15/2007  . HTN (hypertension) 08/20/2011  . OA (osteoarthritis) of knee 02/17/2012  . ED (erectile dysfunction) 02/17/2012  . Bronchitis 07/14/2012  . Preventative health care 07/19/2012  . Personal history of colonic polyps 01/17/2013    colonoscoppy 2013, benign polyp advised repeat in 10 years  . Alcohol abuse 11/28/2013    Past Surgical History  Procedure Laterality Date  . Rotator cuff repair  2000  . Vasectomy  1982  . Fundiplication for hh and reflux  1987  . Negative stress test  2008  . Colonoscopy  03/2012    Several hyperplastic polyps    Family History  Problem Relation Age of Onset  . Asthma Mother   . Cancer Mother     uterine   . Alcohol abuse Father   . Stroke Maternal Grandmother     possibly  . Hypertension Maternal Grandfather   . Hypertension Brother   . Cancer  Brother     prostate  . Colon cancer Neg Hx     History   Social History  . Marital Status: Married    Spouse Name: [name]    Number of Children: N/A  . Years of Education: N/A   Occupational History  .  Un[employer]   Social History Main Topics  . Smoking status: Former Smoker    Types: Cigarettes    Quit date: 06/28/2014  . Smokeless tobacco: Former Systems developer    Quit date: 03/18/2012     Comment: quit 06/18/10- started back   . Alcohol Use: 0.0 oz/week    3-5 Cans of beer per week     Comment: 3-5 beer daily  . Drug Use: No  . Sexual Activity:    Partners: Female   Other Topics Concern  . Not on file   Social History Narrative    Current Outpatient Prescriptions on File Prior to Visit  Medication Sig Dispense Refill  . albuterol (PROVENTIL HFA;VENTOLIN HFA) 108 (90 BASE) MCG/ACT inhaler Inhale 2 puffs into the lungs every 6 (six) hours as needed for wheezing. 3 Inhaler 3  . cyclobenzaprine (FLEXERIL) 10 MG tablet Take 1 tablet (10 mg total) by mouth 3 (three) times daily as needed for muscle spasms. 30 tablet 1  . fish oil-omega-3 fatty acids 1000 MG capsule  Take 2 g by mouth daily.      Marland Kitchen glucose blood test strip Check BS bid. DX: 250.00 100 each 1  . Lancets (FREESTYLE) lancets 1 each by Other route 2 (two) times daily. diag code 250.00 100 each 1  . lisinopril-hydrochlorothiazide (PRINZIDE,ZESTORETIC) 20-12.5 MG per tablet Take 1 tablet by mouth 2 (two) times daily. 180 tablet 1  . metFORMIN (GLUCOPHAGE) 1000 MG tablet Take 1 tablet (1,000 mg total) by mouth 2 (two) times daily with a meal. 180 tablet 3  . metoprolol (TOPROL-XL) 200 MG 24 hr tablet Take 1 tablet (200 mg total) by mouth daily. 90 tablet 3  . Multiple Vitamins-Minerals (PA MENS 50 PLUS VITAPAK PO) Take 1 capsule by mouth daily.    . simvastatin (ZOCOR) 10 MG tablet Take 1 tablet (10 mg total) by mouth daily at 6 PM. 90 tablet 3  . TRADJENTA 5 MG TABS tablet TAKE 1 TABLET BY MOUTH EVERY DAY 30 tablet 3  .  varenicline (CHANTIX CONTINUING MONTH PAK) 1 MG tablet Take 1 tablet (1 mg total) by mouth 2 (two) times daily. 60 tablet 5  . varenicline (CHANTIX STARTING MONTH PAK) 0.5 MG X 11 & 1 MG X 42 tablet Take one 0.5 mg tablet by mouth once daily for 3 days, then increase to one 0.5 mg tablet twice daily for 4 days, then increase to one 1 mg tablet twice daily. 53 tablet 0   No current facility-administered medications on file prior to visit.    No Known Allergies  Review of Systems  Review of Systems  Constitutional: Negative for fever and malaise/fatigue.  HENT: Negative for congestion.   Eyes: Negative for discharge.  Respiratory: Negative for shortness of breath.   Cardiovascular: Negative for chest pain, palpitations and leg swelling.  Gastrointestinal: Negative for nausea, abdominal pain and diarrhea.  Genitourinary: Negative for dysuria.  Musculoskeletal: Positive for joint pain. Negative for falls.       Left shoulder pain and decreased ROM worsening over several months  Skin: Negative for rash.  Neurological: Negative for loss of consciousness and headaches.  Endo/Heme/Allergies: Negative for polydipsia.  Psychiatric/Behavioral: Negative for depression and suicidal ideas. The patient is not nervous/anxious and does not have insomnia.     Objective  BP 162/91 mmHg  Pulse 84  Temp(Src) 98.3 F (36.8 C) (Oral)  Ht 5\' 9"  (1.753 m)  Wt 241 lb (109.317 kg)  BMI 35.57 kg/m2  SpO2 96%  Physical Exam  Physical Exam  Constitutional: He is oriented to person, place, and time and well-developed, well-nourished, and in no distress. No distress.  HENT:  Head: Normocephalic and atraumatic.  Eyes: Conjunctivae are normal.  Neck: Neck supple. No thyromegaly present.  Cardiovascular: Normal rate, regular rhythm and normal heart sounds.   No murmur heard. Pulmonary/Chest: Effort normal and breath sounds normal. No respiratory distress.  Abdominal: He exhibits no distension and no  mass. There is no tenderness.  Musculoskeletal: He exhibits no edema.  Neurological: He is alert and oriented to person, place, and time.  Skin: Skin is warm.  Psychiatric: Memory, affect and judgment normal.    Lab Results  Component Value Date   TSH 2.27 08/21/2014   Lab Results  Component Value Date   WBC 7.8 08/21/2014   HGB 14.1 08/21/2014   HCT 41.8 08/21/2014   MCV 94.8 08/21/2014   PLT 343.0 08/21/2014   Lab Results  Component Value Date   CREATININE 0.8 08/21/2014   BUN 17 08/21/2014  NA 135 08/21/2014   K 4.4 08/21/2014   CL 96 08/21/2014   CO2 29 08/21/2014   Lab Results  Component Value Date   ALT 33 08/21/2014   AST 19 08/21/2014   ALKPHOS 54 08/21/2014   BILITOT 0.4 08/21/2014   Lab Results  Component Value Date   CHOL 192 08/21/2014   Lab Results  Component Value Date   HDL 40.90 08/21/2014   Lab Results  Component Value Date   LDLCALC 120* 08/21/2014   Lab Results  Component Value Date   TRIG 158.0* 08/21/2014   Lab Results  Component Value Date   CHOLHDL 5 08/21/2014     Assessment & Plan  HTN (hypertension) Well controlled, no changes to meds. Encouraged heart healthy diet such as the DASH diet and exercise as tolerated.    TOBACCO ABUSE Continues to abstain, Chantix has been helpful   MIXED HYPERLIPIDEMIA Tolerating statin, encouraged heart healthy diet, avoid trans fats, minimize simple carbs and saturated fats. Increase exercise as tolerated   Obesity Encouraged DASH diet, decrease po intake and increase exercise as tolerated. Needs 7-8 hours of sleep nightly. Avoid trans fats, eat small, frequent meals every 4-5 hours with lean proteins, complex carbs and healthy fats. Minimize simple carbs   Diabetes mellitus type 2 in obese hgba1c acceptable, minimize simple carbs. Increase exercise as tolerated. Continue current meds

## 2014-08-28 NOTE — Progress Notes (Signed)
Pre visit review using our clinic review tool, if applicable. No additional management support is needed unless otherwise documented below in the visit note. 

## 2014-09-03 ENCOUNTER — Encounter: Payer: Self-pay | Admitting: Family Medicine

## 2014-09-03 NOTE — Assessment & Plan Note (Signed)
hgba1c acceptable, minimize simple carbs. Increase exercise as tolerated. Continue current meds 

## 2014-09-03 NOTE — Assessment & Plan Note (Signed)
Tolerating statin, encouraged heart healthy diet, avoid trans fats, minimize simple carbs and saturated fats. Increase exercise as tolerated 

## 2014-09-03 NOTE — Assessment & Plan Note (Signed)
Continues to abstain, Chantix has been helpful

## 2014-09-03 NOTE — Assessment & Plan Note (Signed)
Well controlled, no changes to meds. Encouraged heart healthy diet such as the DASH diet and exercise as tolerated.  °

## 2014-09-03 NOTE — Assessment & Plan Note (Signed)
Encouraged DASH diet, decrease po intake and increase exercise as tolerated. Needs 7-8 hours of sleep nightly. Avoid trans fats, eat small, frequent meals every 4-5 hours with lean proteins, complex carbs and healthy fats. Minimize simple carbs 

## 2014-09-04 ENCOUNTER — Other Ambulatory Visit: Payer: Self-pay | Admitting: Family Medicine

## 2014-11-20 ENCOUNTER — Telehealth: Payer: Self-pay | Admitting: Family Medicine

## 2014-11-20 NOTE — Telephone Encounter (Signed)
Caller name: asim Relation to pt: self Call back number: [phone] Pharmacy:  Reason for call:   Patient states that he has now having right shoulder pain. He says that he heard of a pill(does not know name) that he could take instead of getting another injection. He was wanting to know if Dr. [name] could prescribe?

## 2014-11-21 NOTE — Telephone Encounter (Signed)
Please advise 

## 2014-11-21 NOTE — Telephone Encounter (Signed)
Notify I am not sure what pill possibly some celebrex or meloxicam possibly prednisone. If we are going to start something like that I should see him to document pain and vital signs.

## 2014-11-22 NOTE — Telephone Encounter (Signed)
Pt will need to come in and be seen for medication, Dr. [name] is unsure as to what medication the Pt is referring.

## 2014-11-22 NOTE — Telephone Encounter (Signed)
Left message for patient to return my call.

## 2014-11-23 ENCOUNTER — Other Ambulatory Visit (INDEPENDENT_AMBULATORY_CARE_PROVIDER_SITE_OTHER): Payer: BC Managed Care – PPO

## 2014-11-23 ENCOUNTER — Other Ambulatory Visit: Payer: Self-pay | Admitting: Family Medicine

## 2014-11-23 DIAGNOSIS — E669 Obesity, unspecified: Secondary | ICD-10-CM

## 2014-11-23 DIAGNOSIS — E785 Hyperlipidemia, unspecified: Secondary | ICD-10-CM

## 2014-11-23 DIAGNOSIS — I10 Essential (primary) hypertension: Secondary | ICD-10-CM | POA: Diagnosis not present

## 2014-11-23 DIAGNOSIS — Z0000 Encounter for general adult medical examination without abnormal findings: Secondary | ICD-10-CM

## 2014-11-23 DIAGNOSIS — E119 Type 2 diabetes mellitus without complications: Secondary | ICD-10-CM

## 2014-11-23 DIAGNOSIS — M25512 Pain in left shoulder: Secondary | ICD-10-CM

## 2014-11-23 DIAGNOSIS — E1169 Type 2 diabetes mellitus with other specified complication: Secondary | ICD-10-CM

## 2014-11-23 LAB — LIPID PANEL
CHOL/HDL RATIO: 4
CHOLESTEROL: 166 mg/dL (ref 0–200)
HDL: 41.4 mg/dL (ref >39.00)
LDL CALC: 102 mg/dL — AB (ref 0–99)
NonHDL: 124.6
Triglycerides: 112 mg/dL (ref 0.0–149.0)
VLDL: 22.4 mg/dL (ref 0.0–40.0)

## 2014-11-23 LAB — TSH: TSH: 1.21 u[IU]/mL (ref 0.35–4.50)

## 2014-11-23 LAB — COMPREHENSIVE METABOLIC PANEL
ALK PHOS: 62 U/L (ref 39–117)
ALT: 32 U/L (ref 0–53)
AST: 15 U/L (ref 0–37)
Albumin: 4.7 g/dL (ref 3.5–5.2)
BILIRUBIN TOTAL: 0.6 mg/dL (ref 0.2–1.2)
BUN: 13 mg/dL (ref 6–23)
CO2: 28 meq/L (ref 19–32)
CREATININE: 0.78 mg/dL (ref 0.40–1.50)
Calcium: 9.9 mg/dL (ref 8.4–10.5)
Chloride: 93 mEq/L — ABNORMAL LOW (ref 96–112)
GFR: 108.86 mL/min (ref >60.00)
Glucose, Bld: 124 mg/dL — ABNORMAL HIGH (ref 70–99)
Potassium: 4.1 mEq/L (ref 3.5–5.1)
Sodium: 130 mEq/L — ABNORMAL LOW (ref 135–145)
Total Protein: 7.6 g/dL (ref 6.0–8.3)

## 2014-11-23 LAB — CBC
HCT: 43.3 % (ref 39.0–52.0)
Hemoglobin: 14.8 g/dL (ref 13.0–17.0)
MCHC: 34.3 g/dL (ref 30.0–36.0)
MCV: 93.5 fl (ref 78.0–100.0)
Platelets: 382 10*3/uL (ref 150.0–400.0)
RBC: 4.63 Mil/uL (ref 4.22–5.81)
RDW: 13.5 % (ref 11.5–15.5)
WBC: 8.3 10*3/uL (ref 4.0–10.5)

## 2014-11-23 LAB — PSA: PSA: 0.34 ng/mL (ref 0.10–4.00)

## 2014-11-23 LAB — HEMOGLOBIN A1C: Hgb A1c MFr Bld: 6.2 % (ref 4.6–6.5)

## 2014-11-27 ENCOUNTER — Other Ambulatory Visit: Payer: Self-pay | Admitting: Family Medicine

## 2014-12-04 ENCOUNTER — Telehealth: Payer: Self-pay | Admitting: *Deleted

## 2014-12-04 NOTE — Telephone Encounter (Signed)
Prior authorization for Tradjenta approved through 12/04/2015. [id number]

## 2015-01-02 ENCOUNTER — Other Ambulatory Visit: Payer: Self-pay | Admitting: Family Medicine

## 2015-01-15 ENCOUNTER — Other Ambulatory Visit: Payer: Self-pay | Admitting: Family Medicine

## 2015-01-29 ENCOUNTER — Other Ambulatory Visit: Payer: Self-pay | Admitting: Family Medicine

## 2015-01-29 ENCOUNTER — Telehealth: Payer: Self-pay | Admitting: Family Medicine

## 2015-01-29 NOTE — Telephone Encounter (Signed)
Caller name: Rod Down Relationship to patient: self Can be reached: [phone] [facility] [address]  Reason for call: Pt will be going out of town the end of the month and will gone for a couple of weeks. He'll be due for a med refill but will be out of town. He is wondering if we can call in this months refills for 2 months supply to be dispensed so he doesn't run out on vacation

## 2015-01-30 ENCOUNTER — Encounter: Payer: BC Managed Care – PPO | Admitting: Family Medicine

## 2015-04-17 LAB — HM DIABETES EYE EXAM

## 2015-04-20 ENCOUNTER — Encounter: Payer: Self-pay | Admitting: Family Medicine

## 2015-05-31 ENCOUNTER — Ambulatory Visit (INDEPENDENT_AMBULATORY_CARE_PROVIDER_SITE_OTHER): Payer: BC Managed Care – PPO | Admitting: Family Medicine

## 2015-05-31 ENCOUNTER — Encounter: Payer: Self-pay | Admitting: Family Medicine

## 2015-05-31 VITALS — BP 128/80 | HR 93 | Temp 98.8°F | Ht 69.0 in | Wt 243.2 lb

## 2015-05-31 DIAGNOSIS — Z0000 Encounter for general adult medical examination without abnormal findings: Secondary | ICD-10-CM

## 2015-05-31 DIAGNOSIS — E669 Obesity, unspecified: Secondary | ICD-10-CM | POA: Diagnosis not present

## 2015-05-31 DIAGNOSIS — Z87891 Personal history of nicotine dependence: Secondary | ICD-10-CM

## 2015-05-31 DIAGNOSIS — E782 Mixed hyperlipidemia: Secondary | ICD-10-CM

## 2015-05-31 DIAGNOSIS — Z23 Encounter for immunization: Secondary | ICD-10-CM

## 2015-05-31 DIAGNOSIS — E785 Hyperlipidemia, unspecified: Secondary | ICD-10-CM | POA: Diagnosis not present

## 2015-05-31 DIAGNOSIS — F1010 Alcohol abuse, uncomplicated: Secondary | ICD-10-CM

## 2015-05-31 DIAGNOSIS — I10 Essential (primary) hypertension: Secondary | ICD-10-CM | POA: Diagnosis not present

## 2015-05-31 DIAGNOSIS — N529 Male erectile dysfunction, unspecified: Secondary | ICD-10-CM

## 2015-05-31 DIAGNOSIS — E119 Type 2 diabetes mellitus without complications: Secondary | ICD-10-CM

## 2015-05-31 DIAGNOSIS — E1169 Type 2 diabetes mellitus with other specified complication: Secondary | ICD-10-CM

## 2015-05-31 LAB — COMPREHENSIVE METABOLIC PANEL
ALBUMIN: 4.7 g/dL (ref 3.5–5.2)
ALK PHOS: 59 U/L (ref 39–117)
ALT: 41 U/L (ref 0–53)
AST: 22 U/L (ref 0–37)
BUN: 12 mg/dL (ref 6–23)
CHLORIDE: 95 meq/L — AB (ref 96–112)
CO2: 31 mEq/L (ref 19–32)
Calcium: 10.4 mg/dL (ref 8.4–10.5)
Creatinine, Ser: 0.84 mg/dL (ref 0.40–1.50)
GFR: 99.75 mL/min (ref >60.00)
GLUCOSE: 115 mg/dL — AB (ref 70–99)
POTASSIUM: 4.7 meq/L (ref 3.5–5.1)
SODIUM: 136 meq/L (ref 135–145)
TOTAL PROTEIN: 7.9 g/dL (ref 6.0–8.3)
Total Bilirubin: 0.4 mg/dL (ref 0.2–1.2)

## 2015-05-31 LAB — LIPID PANEL
CHOLESTEROL: 122 mg/dL (ref 0–200)
HDL: 45 mg/dL (ref >39.00)
LDL CALC: 59 mg/dL (ref 0–99)
NonHDL: 77.49
Total CHOL/HDL Ratio: 3
Triglycerides: 92 mg/dL (ref 0.0–149.0)
VLDL: 18.4 mg/dL (ref 0.0–40.0)

## 2015-05-31 LAB — CBC
HEMATOCRIT: 42.5 % (ref 39.0–52.0)
HEMOGLOBIN: 14.2 g/dL (ref 13.0–17.0)
MCHC: 33.3 g/dL (ref 30.0–36.0)
MCV: 95.4 fl (ref 78.0–100.0)
PLATELETS: 342 10*3/uL (ref 150.0–400.0)
RBC: 4.46 Mil/uL (ref 4.22–5.81)
RDW: 13.8 % (ref 11.5–15.5)
WBC: 10.1 10*3/uL (ref 4.0–10.5)

## 2015-05-31 LAB — MICROALBUMIN / CREATININE URINE RATIO
Creatinine,U: 129.1 mg/dL
MICROALB/CREAT RATIO: 1.9 mg/g (ref 0.0–30.0)
Microalb, Ur: 2.5 mg/dL — ABNORMAL HIGH (ref 0.0–1.9)

## 2015-05-31 LAB — HEMOGLOBIN A1C: Hgb A1c MFr Bld: 6.6 % — ABNORMAL HIGH (ref 4.6–6.5)

## 2015-05-31 LAB — TSH: TSH: 1.1 u[IU]/mL (ref 0.35–4.50)

## 2015-05-31 MED ORDER — METFORMIN HCL 1000 MG PO TABS: ORAL_TABLET | ORAL | Status: DC

## 2015-05-31 MED ORDER — SIMVASTATIN 10 MG PO TABS: 10.0000 mg | ORAL_TABLET | Freq: Every day | ORAL | Status: DC

## 2015-05-31 MED ORDER — LINAGLIPTIN 5 MG PO TABS: 5.0000 mg | ORAL_TABLET | Freq: Every day | ORAL | Status: DC

## 2015-05-31 MED ORDER — FREESTYLE LANCETS MISC: Status: DC

## 2015-05-31 MED ORDER — LISINOPRIL-HYDROCHLOROTHIAZIDE 20-12.5 MG PO TABS: 1.0000 | ORAL_TABLET | Freq: Two times a day (BID) | ORAL | Status: DC

## 2015-05-31 MED ORDER — GLUCOSE BLOOD VI STRP: ORAL_STRIP | Status: DC

## 2015-05-31 MED ORDER — METOPROLOL SUCCINATE ER 200 MG PO TB24: 200.0000 mg | ORAL_TABLET | Freq: Every day | ORAL | Status: DC

## 2015-05-31 NOTE — Progress Notes (Signed)
Pre visit review using our clinic review tool, if applicable. No additional management support is needed unless otherwise documented below in the visit note. 

## 2015-05-31 NOTE — Patient Instructions (Signed)

## 2015-06-10 ENCOUNTER — Encounter: Payer: Self-pay | Admitting: Family Medicine

## 2015-06-10 NOTE — Assessment & Plan Note (Signed)
Has been cutting down. Encouraged to not surpass 2 beverages daily

## 2015-06-10 NOTE — Assessment & Plan Note (Signed)
Patient encouraged to maintain heart healthy diet, regular exercise, adequate sleep. Consider daily probiotics. Take medications as prescribed. Given and reviewed copy of ACP documents from [place] Secretary of State and encouraged to complete and return 

## 2015-06-10 NOTE — Progress Notes (Signed)
Subjective:    Patient ID: Phillip Doyle, male    DOB: Aug 24, 1955, 59 y.o.   MRN: [id number]  Chief Complaint  Patient presents with  . Annual Exam    HPI Patient is in today for nnual exam. Overall he is doing well. He continues to abstain from cigarette smoking. He has cut down on his alcohol intake. He reports continuing to stay active at his job but no targeted exercise. No recent illness. No hospitalization. He's been trying to maintain a heart healthy diet and does better on some days than others. No polyuria or polydipsia. Denies CP/palp/SOB/HA/congestion/fevers/GI or GU c/o. Taking meds as prescribed  Past Medical History  Diagnosis Date  . Allergy     rhinitis  . Hypertension   . Obesity   . Ulcer 1982    peptic ulcer disease  . Impaired glucose tolerance   . Diabetes mellitus   . TOBACCO ABUSE 07/04/2010  . PEPTIC ULCER DISEASE 02/15/2007  . NISSEN FUNDOPLICATION, HX OF 3/53/2992  . Mixed hyperlipidemia 07/04/2010  . LUMBAR STRAIN 10/17/2010  . HYPERTENSION 02/15/2007  . EXOGENOUS OBESITY 02/15/2007  . DM 10/24/2010  . ALLERGIC RHINITIS 02/15/2007  . HTN (hypertension) 08/20/2011  . OA (osteoarthritis) of knee 02/17/2012  . ED (erectile dysfunction) 02/17/2012  . Bronchitis 07/14/2012  . Preventative health care 07/19/2012  . Personal history of colonic polyps 01/17/2013    colonoscoppy 2013, benign polyp advised repeat in 10 years  . Alcohol abuse 11/28/2013  . H/O tobacco use, presenting hazards to health 07/04/2010    Qualifier: Diagnosis of  By: [name] MD, Erline Levine  1/2 ppd     Past Surgical History  Procedure Laterality Date  . Rotator cuff repair  2000  . Vasectomy  1982  . Fundiplication for hh and reflux  1987  . Negative stress test  2008  . Colonoscopy  03/2012    Several hyperplastic polyps    Family History  Problem Relation Age of Onset  . Asthma Mother   . Cancer Mother     uterine   . Alcohol abuse Father   . Stroke Maternal Grandmother     possibly  .  Hypertension Maternal Grandfather   . Hypertension Brother   . Cancer Brother     prostate  . Colon cancer Neg Hx     Social History   Social History  . Marital Status: Married    Spouse Name: [name]  . Number of Children: N/A  . Years of Education: N/A   Occupational History  .  [employer]   Social History Main Topics  . Smoking status: Former Smoker    Types: Cigarettes    Quit date: 06/28/2014  . Smokeless tobacco: Former Systems developer    Quit date: 03/18/2012     Comment: quit 06/18/10- started back   . Alcohol Use: 0.0 oz/week    3-5 Cans of beer per week     Comment: 3-5 beer daily  . Drug Use: No  . Sexual Activity:    Partners: Female   Other Topics Concern  . Not on file   Social History Narrative    Outpatient Prescriptions Prior to Visit  Medication Sig Dispense Refill  . albuterol (PROVENTIL HFA;VENTOLIN HFA) 108 (90 BASE) MCG/ACT inhaler Inhale 2 puffs into the lungs every 6 (six) hours as needed for wheezing. 3 Inhaler 3  . fish oil-omega-3 fatty acids 1000 MG capsule Take 2 g by mouth daily.      . Multiple Vitamins-Minerals (  PA MENS 50 PLUS VITAPAK PO) Take 1 capsule by mouth daily.    Marland Kitchen glucose blood test strip Check BS bid. DX: 250.00 100 each 1  . Lancets (FREESTYLE) lancets 1 each by Other route 2 (two) times daily. diag code 250.00 100 each 1  . lisinopril-hydrochlorothiazide (PRINZIDE,ZESTORETIC) 20-12.5 MG per tablet TAKE 1 TABLET TWICE A DAY 180 tablet 1  . metFORMIN (GLUCOPHAGE) 1000 MG tablet TAKE 1 TABLET BY MOUTH 2 TIMES DAILY WITH A MEAL. 180 tablet 1  . metoprolol (TOPROL-XL) 200 MG 24 hr tablet TAKE 1 TABLET BY MOUTH DAILY 90 tablet 3  . simvastatin (ZOCOR) 10 MG tablet Take 1 tablet (10 mg total) by mouth daily at 6 PM. 90 tablet 3  . TRADJENTA 5 MG TABS tablet TAKE 1 TABLET BY MOUTH EVERY DAY 30 tablet 6  . cyclobenzaprine (FLEXERIL) 10 MG tablet Take 1 tablet (10 mg total) by mouth 3 (three) times daily as needed for muscle spasms. 30 tablet 1  .  varenicline (CHANTIX CONTINUING MONTH PAK) 1 MG tablet Take 1 tablet (1 mg total) by mouth 2 (two) times daily. 60 tablet 5  . varenicline (CHANTIX STARTING MONTH PAK) 0.5 MG X 11 & 1 MG X 42 tablet Take one 0.5 mg tablet by mouth once daily for 3 days, then increase to one 0.5 mg tablet twice daily for 4 days, then increase to one 1 mg tablet twice daily. 53 tablet 0   No facility-administered medications prior to visit.    No Known Allergies  Review of Systems  Constitutional: Positive for malaise/fatigue. Negative for fever and chills.  HENT: Negative for congestion and hearing loss.   Eyes: Negative for discharge.  Respiratory: Negative for cough, sputum production and shortness of breath.   Cardiovascular: Negative for chest pain, palpitations and leg swelling.  Gastrointestinal: Negative for heartburn, nausea, vomiting, abdominal pain, diarrhea, constipation and blood in stool.  Genitourinary: Negative for dysuria, urgency, frequency and hematuria.  Musculoskeletal: Negative for myalgias, back pain and falls.  Skin: Negative for rash.  Neurological: Negative for dizziness, sensory change, loss of consciousness, weakness and headaches.  Endo/Heme/Allergies: Negative for environmental allergies. Does not bruise/bleed easily.  Psychiatric/Behavioral: Negative for depression and suicidal ideas. The patient is not nervous/anxious and does not have insomnia.        Objective:    Physical Exam  Constitutional: He is oriented to person, place, and time. He appears well-developed and well-nourished. No distress.  HENT:  Head: Normocephalic and atraumatic.  Eyes: Conjunctivae are normal.  Neck: Neck supple. No thyromegaly present.  Cardiovascular: Normal rate, regular rhythm and normal heart sounds.   No murmur heard. Pulmonary/Chest: Effort normal and breath sounds normal. No respiratory distress. He has no wheezes.  Abdominal: Soft. Bowel sounds are normal. He exhibits no mass. There  is no tenderness.  Musculoskeletal: He exhibits no edema.  Lymphadenopathy:    He has no cervical adenopathy.  Neurological: He is alert and oriented to person, place, and time.  Skin: Skin is warm and dry.  Psychiatric: He has a normal mood and affect. His behavior is normal.    BP 128/80 mmHg  Pulse 93  Temp(Src) 98.8 F (37.1 C) (Oral)  Ht 5\' 9"  (1.753 m)  Wt 243 lb 4 oz (110.337 kg)  BMI 35.91 kg/m2  SpO2 95% Wt Readings from Last 3 Encounters:  05/31/15 243 lb 4 oz (110.337 kg)  08/28/14 241 lb (109.317 kg)  05/29/14 230 lb 3.2 oz (104.418 kg)  Lab Results  Component Value Date   WBC 10.1 05/31/2015   HGB 14.2 05/31/2015   HCT 42.5 05/31/2015   PLT 342.0 05/31/2015   GLUCOSE 115* 05/31/2015   CHOL 122 05/31/2015   TRIG 92.0 05/31/2015   HDL 45.00 05/31/2015   LDLDIRECT 101.3 01/28/2008   LDLCALC 59 05/31/2015   ALT 41 05/31/2015   AST 22 05/31/2015   NA 136 05/31/2015   K 4.7 05/31/2015   CL 95* 05/31/2015   CREATININE 0.84 05/31/2015   BUN 12 05/31/2015   CO2 31 05/31/2015   TSH 1.10 05/31/2015   PSA 0.34 11/23/2014   HGBA1C 6.6* 05/31/2015   MICROALBUR 2.5* 05/31/2015    Lab Results  Component Value Date   TSH 1.10 05/31/2015   Lab Results  Component Value Date   WBC 10.1 05/31/2015   HGB 14.2 05/31/2015   HCT 42.5 05/31/2015   MCV 95.4 05/31/2015   PLT 342.0 05/31/2015   Lab Results  Component Value Date   NA 136 05/31/2015   K 4.7 05/31/2015   CO2 31 05/31/2015   GLUCOSE 115* 05/31/2015   BUN 12 05/31/2015   CREATININE 0.84 05/31/2015   BILITOT 0.4 05/31/2015   ALKPHOS 59 05/31/2015   AST 22 05/31/2015   ALT 41 05/31/2015   PROT 7.9 05/31/2015   ALBUMIN 4.7 05/31/2015   CALCIUM 10.4 05/31/2015   GFR 99.75 05/31/2015   Lab Results  Component Value Date   CHOL 122 05/31/2015   Lab Results  Component Value Date   HDL 45.00 05/31/2015   Lab Results  Component Value Date   LDLCALC 59 05/31/2015   Lab Results  Component  Value Date   TRIG 92.0 05/31/2015   Lab Results  Component Value Date   CHOLHDL 3 05/31/2015   Lab Results  Component Value Date   HGBA1C 6.6* 05/31/2015       Assessment & Plan:   Problem List Items Addressed This Visit    Preventative health care    Patient encouraged to maintain heart healthy diet, regular exercise, adequate sleep. Consider daily probiotics. Take medications as prescribed. Given and reviewed copy of ACP documents from [place] of [phone] and encouraged to complete and return      Obesity   Relevant Medications   linagliptin (TRADJENTA) 5 MG TABS tablet   metFORMIN (GLUCOPHAGE) 1000 MG tablet   Other Relevant Orders   TSH (Completed)   CBC (Completed)   Comprehensive metabolic panel (Completed)   Lipid panel (Completed)   Hemoglobin A1c (Completed)   Microalbumin / creatinine urine ratio (Completed)   MIXED HYPERLIPIDEMIA    Tolerating statin, encouraged heart healthy diet, avoid trans fats, minimize simple carbs and saturated fats. Increase exercise as tolerated      Relevant Medications   simvastatin (ZOCOR) 10 MG tablet   metoprolol (TOPROL-XL) 200 MG 24 hr tablet   lisinopril-hydrochlorothiazide (PRINZIDE,ZESTORETIC) 20-12.5 MG tablet   Other Relevant Orders   TSH (Completed)   CBC (Completed)   Comprehensive metabolic panel (Completed)   Lipid panel (Completed)   Hemoglobin A1c (Completed)   Microalbumin / creatinine urine ratio (Completed)   HTN (hypertension)    Well controlled, no changes to meds. Encouraged heart healthy diet such as the DASH diet and exercise as tolerated.       Relevant Medications   simvastatin (ZOCOR) 10 MG tablet   metoprolol (TOPROL-XL) 200 MG 24 hr tablet   lisinopril-hydrochlorothiazide (PRINZIDE,ZESTORETIC) 20-12.5 MG tablet   Other Relevant Orders  TSH (Completed)   CBC (Completed)   Comprehensive metabolic panel (Completed)   Lipid panel (Completed)   Hemoglobin A1c (Completed)   Microalbumin /  creatinine urine ratio (Completed)   H/O tobacco use, presenting hazards to health    Continues to abstain      ED (erectile dysfunction)   Relevant Orders   TSH (Completed)   CBC (Completed)   Comprehensive metabolic panel (Completed)   Lipid panel (Completed)   Hemoglobin A1c (Completed)   Microalbumin / creatinine urine ratio (Completed)   Diabetes mellitus type 2 in obese (HCC)    hgba1c acceptable, minimize simple carbs. Increase exercise as tolerated. Continue current meds. Given flu shot      Relevant Medications   linagliptin (TRADJENTA) 5 MG TABS tablet   simvastatin (ZOCOR) 10 MG tablet   metFORMIN (GLUCOPHAGE) 1000 MG tablet   lisinopril-hydrochlorothiazide (PRINZIDE,ZESTORETIC) 20-12.5 MG tablet   Other Relevant Orders   TSH (Completed)   CBC (Completed)   Comprehensive metabolic panel (Completed)   Lipid panel (Completed)   Hemoglobin A1c (Completed)   Microalbumin / creatinine urine ratio (Completed)   Alcohol abuse    Has been cutting down. Encouraged to not surpass 2 beverages daily       Other Visit Diagnoses    Hyperlipidemia    -  Primary    Relevant Medications    simvastatin (ZOCOR) 10 MG tablet    metoprolol (TOPROL-XL) 200 MG 24 hr tablet    lisinopril-hydrochlorothiazide (PRINZIDE,ZESTORETIC) 20-12.5 MG tablet    Other Relevant Orders    TSH (Completed)    CBC (Completed)    Comprehensive metabolic panel (Completed)    Lipid panel (Completed)    Hemoglobin A1c (Completed)    Microalbumin / creatinine urine ratio (Completed)    Encounter for immunization        Need for 23-polyvalent pneumococcal polysaccharide vaccine        Relevant Orders    Pneumococcal polysaccharide vaccine 23-valent greater than or equal to 2yo subcutaneous/IM (Completed)       I have discontinued Mr. Bertino's cyclobenzaprine, varenicline, and varenicline. I have changed his TRADJENTA to linagliptin. I have also changed his metoprolol, lisinopril-hydrochlorothiazide,  freestyle, and glucose blood. Additionally, I am having him maintain his fish oil-omega-3 fatty acids, Multiple Vitamins-Minerals (PA MENS 50 PLUS VITAPAK PO), albuterol, simvastatin, and metFORMIN.  Meds ordered this encounter  Medications  . linagliptin (TRADJENTA) 5 MG TABS tablet    Sig: Take 1 tablet (5 mg total) by mouth daily.    Dispense:  90 tablet    Refill:  2  . simvastatin (ZOCOR) 10 MG tablet    Sig: Take 1 tablet (10 mg total) by mouth daily at 6 PM.    Dispense:  90 tablet    Refill:  3  . metoprolol (TOPROL-XL) 200 MG 24 hr tablet    Sig: Take 1 tablet (200 mg total) by mouth daily.    Dispense:  90 tablet    Refill:  3  . metFORMIN (GLUCOPHAGE) 1000 MG tablet    Sig: TAKE 1 TABLET BY MOUTH 2 TIMES DAILY WITH A MEAL.    Dispense:  180 tablet    Refill:  2  . lisinopril-hydrochlorothiazide (PRINZIDE,ZESTORETIC) 20-12.5 MG tablet    Sig: Take 1 tablet by mouth 2 (two) times daily.    Dispense:  180 tablet    Refill:  2  . Lancets (FREESTYLE) lancets    Sig: Check blood sugar twice daily.  DX E11.9  Dispense:  100 each    Refill:  6  . glucose blood test strip    Sig: Check blood sugar twice daily. DX: E11.9    Dispense:  100 each    Refill:  6     Penni Homans, MD

## 2015-06-10 NOTE — Assessment & Plan Note (Signed)
Continues to abstain 

## 2015-06-10 NOTE — Assessment & Plan Note (Signed)
hgba1c acceptable, minimize simple carbs. Increase exercise as tolerated. Continue current meds. Given flu shot

## 2015-06-10 NOTE — Assessment & Plan Note (Signed)
Well controlled, no changes to meds. Encouraged heart healthy diet such as the DASH diet and exercise as tolerated.  °

## 2015-06-10 NOTE — Assessment & Plan Note (Signed)
Tolerating statin, encouraged heart healthy diet, avoid trans fats, minimize simple carbs and saturated fats. Increase exercise as tolerated 

## 2015-07-09 ENCOUNTER — Encounter: Payer: Self-pay | Admitting: Family

## 2015-07-09 ENCOUNTER — Ambulatory Visit (INDEPENDENT_AMBULATORY_CARE_PROVIDER_SITE_OTHER): Payer: BC Managed Care – PPO | Admitting: Family

## 2015-07-09 VITALS — BP 140/72 | HR 94 | Temp 98.7°F | Resp 18 | Ht 69.0 in | Wt 242.3 lb

## 2015-07-09 DIAGNOSIS — L03011 Cellulitis of right finger: Secondary | ICD-10-CM

## 2015-07-09 MED ORDER — CEPHALEXIN 500 MG PO CAPS
500.0000 mg | ORAL_CAPSULE | Freq: Four times a day (QID) | ORAL | Status: DC
Start: 2015-07-09 — End: 2015-07-16

## 2015-07-09 NOTE — Progress Notes (Signed)
Subjective:    Patient ID: Phillip Doyle, male    DOB: 1955/11/27, 59 y.o.   MRN: [id number]  HPI  Mr. Shively is a 59 yr old male who presents today with complaint of injury to right index finger 1 week ago. Pt cut his finger with a band saw. Has been soaking finger bid in betadine and applying neosporin.  Tetanus is up to date.  He reports that he has now developed swelling of the finger and discharge from the wound.   Review of Systems See HPI  Past Medical History  Diagnosis Date  . Allergy     rhinitis  . Hypertension   . Obesity   . Ulcer 1982    peptic ulcer disease  . Impaired glucose tolerance   . Diabetes mellitus   . TOBACCO ABUSE 07/04/2010  . PEPTIC ULCER DISEASE 02/15/2007  . NISSEN FUNDOPLICATION, HX OF 123456  . Mixed hyperlipidemia 07/04/2010  . LUMBAR STRAIN 10/17/2010  . HYPERTENSION 02/15/2007  . EXOGENOUS OBESITY 02/15/2007  . DM 10/24/2010  . ALLERGIC RHINITIS 02/15/2007  . HTN (hypertension) 08/20/2011  . OA (osteoarthritis) of knee 02/17/2012  . ED (erectile dysfunction) 02/17/2012  . Bronchitis 07/14/2012  . Preventative health care 07/19/2012  . Personal history of colonic polyps 01/17/2013    colonoscoppy 2013, benign polyp advised repeat in 10 years  . Alcohol abuse 11/28/2013  . H/O tobacco use, presenting hazards to health 07/04/2010    Qualifier: Diagnosis of  By: [name] MD, Erline Levine  1/2 ppd     Social History   Social History  . Marital Status: Married    Spouse Name: [name]  . Number of Children: N/A  . Years of Education: N/A   Occupational History  .  [employer]   Social History Main Topics  . Smoking status: Former Smoker    Types: Cigarettes    Quit date: 06/28/2014  . Smokeless tobacco: Former Systems developer    Quit date: 03/18/2012     Comment: quit 06/18/10- started back   . Alcohol Use: 0.0 oz/week    3-5 Cans of beer per week     Comment: 3-5 beer daily  . Drug Use: No  . Sexual Activity:    Partners: Female   Other Topics Concern  . Not on file     Social History Narrative    Past Surgical History  Procedure Laterality Date  . Rotator cuff repair  2000  . Vasectomy  1982  . Fundiplication for hh and reflux  1987  . Negative stress test  2008  . Colonoscopy  03/2012    Several hyperplastic polyps    Family History  Problem Relation Age of Onset  . Asthma Mother   . Cancer Mother     uterine   . Alcohol abuse Father   . Stroke Maternal Grandmother     possibly  . Hypertension Maternal Grandfather   . Hypertension Brother   . Cancer Brother     prostate  . Colon cancer Neg Hx     No Known Allergies  Current Outpatient Prescriptions on File Prior to Visit  Medication Sig Dispense Refill  . albuterol (PROVENTIL HFA;VENTOLIN HFA) 108 (90 BASE) MCG/ACT inhaler Inhale 2 puffs into the lungs every 6 (six) hours as needed for wheezing. 3 Inhaler 3  . fish oil-omega-3 fatty acids 1000 MG capsule Take 2 g by mouth daily.      Marland Kitchen glucose blood test strip Check blood sugar twice daily. DX: E11.9  100 each 6  . Lancets (FREESTYLE) lancets Check blood sugar twice daily.  DX E11.9 100 each 6  . linagliptin (TRADJENTA) 5 MG TABS tablet Take 1 tablet (5 mg total) by mouth daily. 90 tablet 2  . lisinopril-hydrochlorothiazide (PRINZIDE,ZESTORETIC) 20-12.5 MG tablet Take 1 tablet by mouth 2 (two) times daily. 180 tablet 2  . metFORMIN (GLUCOPHAGE) 1000 MG tablet TAKE 1 TABLET BY MOUTH 2 TIMES DAILY WITH A MEAL. 180 tablet 2  . metoprolol (TOPROL-XL) 200 MG 24 hr tablet Take 1 tablet (200 mg total) by mouth daily. 90 tablet 3  . Multiple Vitamins-Minerals (PA MENS 50 PLUS VITAPAK PO) Take 1 capsule by mouth daily.    . simvastatin (ZOCOR) 10 MG tablet Take 1 tablet (10 mg total) by mouth daily at 6 PM. 90 tablet 3   No current facility-administered medications on file prior to visit.    BP 140/72 mmHg  Pulse 94  Temp(Src) 98.7 F (37.1 C) (Oral)  Resp 18  Ht 5\' 9"  (1.753 m)  Wt 242 lb 4.8 oz (109.907 kg)  BMI 35.77 kg/m2  SpO2  97%       Objective:   Physical Exam  Constitutional: He is oriented to person, place, and time. He appears well-developed and well-nourished. No distress.  HENT:  Head: Normocephalic and atraumatic.  Cardiovascular: Regular rhythm.   Musculoskeletal: He exhibits no edema.  Neurological: He is alert and oriented to person, place, and time.  Skin: Skin is warm and dry.  R index finger is swollen, erythematous, some clear drainage noted from wound without odor  Psychiatric: He has a normal mood and affect. His behavior is normal. Thought content normal.            Assessment & Plan:

## 2015-07-09 NOTE — Progress Notes (Signed)
Pre visit review using our clinic review tool, if applicable. No additional management support is needed unless otherwise documented below in the visit note. 

## 2015-07-09 NOTE — Patient Instructions (Signed)
Start Keflex (antibiotic) 4 times daily. Keep wound clean and dry.  Continue to apply antibiotic ointment once daily with dressing change. Call if you develop increased swelling,

## 2015-07-16 ENCOUNTER — Encounter: Payer: Self-pay | Admitting: Family

## 2015-07-16 ENCOUNTER — Ambulatory Visit (INDEPENDENT_AMBULATORY_CARE_PROVIDER_SITE_OTHER): Payer: BC Managed Care – PPO | Admitting: Family

## 2015-07-16 VITALS — BP 130/70 | HR 90 | Temp 98.7°F | Resp 16 | Ht 69.0 in | Wt 246.4 lb

## 2015-07-16 DIAGNOSIS — L03011 Cellulitis of right finger: Secondary | ICD-10-CM

## 2015-07-16 MED ORDER — CEPHALEXIN 500 MG PO CAPS: 500.0000 mg | ORAL_CAPSULE | Freq: Four times a day (QID) | ORAL | Status: DC

## 2015-07-16 NOTE — Progress Notes (Signed)
Subjective:    Patient ID: Phillip Doyle, male    DOB: 20-Jun-1956, 59 y.o.   MRN: [id number]  HPI  Mr. Phillip Doyle is a 59 yr old male who presents today for follow up of his cellulitis.  Pt was last seen on 11/21 with cellulitis of the right index finger following laceration with a band saw.  He was placed on keflex.  Pt took last dose of keflex this AM. Reports significant improvement in the swelling and wound. Reports he is now able to bend his finger which he was unable to do last visit.  Review of Systems See HPI  Past Medical History  Diagnosis Date  . Allergy     rhinitis  . Hypertension   . Obesity   . Ulcer 1982    peptic ulcer disease  . Impaired glucose tolerance   . Diabetes mellitus   . TOBACCO ABUSE 07/04/2010  . PEPTIC ULCER DISEASE 02/15/2007  . NISSEN FUNDOPLICATION, HX OF 123456  . Mixed hyperlipidemia 07/04/2010  . LUMBAR STRAIN 10/17/2010  . HYPERTENSION 02/15/2007  . EXOGENOUS OBESITY 02/15/2007  . DM 10/24/2010  . ALLERGIC RHINITIS 02/15/2007  . HTN (hypertension) 08/20/2011  . OA (osteoarthritis) of knee 02/17/2012  . ED (erectile dysfunction) 02/17/2012  . Bronchitis 07/14/2012  . Preventative health care 07/19/2012  . Personal history of colonic polyps 01/17/2013    colonoscoppy 2013, benign polyp advised repeat in 10 years  . Alcohol abuse 11/28/2013  . H/O tobacco use, presenting hazards to health 07/04/2010    Qualifier: Diagnosis of  By: [name] MD, Erline Levine  1/2 ppd     Social History   Social History  . Marital Status: Married    Spouse Name: Phillip Doyle  . Number of Children: N/A  . Years of Education: N/A   Occupational History  .  [employer]   Social History Main Topics  . Smoking status: Former Smoker    Types: Cigarettes    Quit date: 06/28/2014  . Smokeless tobacco: Former Systems developer    Quit date: 03/18/2012     Comment: quit 06/18/10- started back   . Alcohol Use: 0.0 oz/week    3-5 Cans of beer per week     Comment: 3-5 beer daily  . Drug Use: No  . Sexual  Activity:    Partners: Female   Other Topics Concern  . Not on file   Social History Narrative    Past Surgical History  Procedure Laterality Date  . Rotator cuff repair  2000  . Vasectomy  1982  . Fundiplication for hh and reflux  1987  . Negative stress test  2008  . Colonoscopy  03/2012    Several hyperplastic polyps    Family History  Problem Relation Age of Onset  . Asthma Mother   . Cancer Mother     uterine   . Alcohol abuse Father   . Stroke Maternal Grandmother     possibly  . Hypertension Maternal Grandfather   . Hypertension Brother   . Cancer Brother     prostate  . Colon cancer Neg Hx     No Known Allergies  Current Outpatient Prescriptions on File Prior to Visit  Medication Sig Dispense Refill  . albuterol (PROVENTIL HFA;VENTOLIN HFA) 108 (90 BASE) MCG/ACT inhaler Inhale 2 puffs into the lungs every 6 (six) hours as needed for wheezing. 3 Inhaler 3  . fish oil-omega-3 fatty acids 1000 MG capsule Take 2 g by mouth daily.      Marland Kitchen  glucose blood test strip Check blood sugar twice daily. DX: E11.9 100 each 6  . Lancets (FREESTYLE) lancets Check blood sugar twice daily.  DX E11.9 100 each 6  . linagliptin (TRADJENTA) 5 MG TABS tablet Take 1 tablet (5 mg total) by mouth daily. 90 tablet 2  . lisinopril-hydrochlorothiazide (PRINZIDE,ZESTORETIC) 20-12.5 MG tablet Take 1 tablet by mouth 2 (two) times daily. 180 tablet 2  . metFORMIN (GLUCOPHAGE) 1000 MG tablet TAKE 1 TABLET BY MOUTH 2 TIMES DAILY WITH A MEAL. 180 tablet 2  . metoprolol (TOPROL-XL) 200 MG 24 hr tablet Take 1 tablet (200 mg total) by mouth daily. 90 tablet 3  . Multiple Vitamins-Minerals (PA MENS 50 PLUS VITAPAK PO) Take 1 capsule by mouth daily.    . simvastatin (ZOCOR) 10 MG tablet Take 1 tablet (10 mg total) by mouth daily at 6 PM. 90 tablet 3   No current facility-administered medications on file prior to visit.    BP 130/70 mmHg  Pulse 90  Temp(Src) 98.7 F (37.1 C) (Oral)  Resp 16  Ht  5\' 9"  (1.753 m)  Wt 246 lb 6.4 oz (111.766 kg)  BMI 36.37 kg/m2  SpO2 97%       Objective:   Physical Exam  Constitutional: He appears well-developed and well-nourished. No distress.  Skin: Skin is warm and dry.  Decreased swelling, erythema of right index finger. Laceration without significant drainage- healing well  Psychiatric: He has a normal mood and affect. His behavior is normal. Judgment and thought content normal.            Assessment & Plan:  Cellulitis of right index finger- improving on keflex. Advised pt to continue keflex x 3 more days. Call if finger does not continue to improve. Keep covered until healed. Pt verbalizes understanding.

## 2015-07-16 NOTE — Progress Notes (Signed)
Pre visit review using our clinic review tool, if applicable. No additional management support is needed unless otherwise documented below in the visit note. 

## 2015-07-16 NOTE — Patient Instructions (Signed)
Continue keflex for 3 more days.  Call if finger does not continue to improve.

## 2015-07-16 NOTE — Addendum Note (Signed)
Addended by: Debbrah Alar on: 07/16/2015 01:10 PM   Modules accepted: Level of Service

## 2015-07-28 ENCOUNTER — Other Ambulatory Visit: Payer: Self-pay | Admitting: Family Medicine

## 2015-09-11 ENCOUNTER — Other Ambulatory Visit: Payer: Self-pay | Admitting: Family Medicine

## 2015-10-02 ENCOUNTER — Other Ambulatory Visit: Payer: Self-pay | Admitting: Family Medicine

## 2015-10-14 ENCOUNTER — Other Ambulatory Visit: Payer: Self-pay | Admitting: Family Medicine

## 2015-11-22 ENCOUNTER — Other Ambulatory Visit (INDEPENDENT_AMBULATORY_CARE_PROVIDER_SITE_OTHER): Payer: BC Managed Care – PPO

## 2015-11-22 DIAGNOSIS — E669 Obesity, unspecified: Secondary | ICD-10-CM

## 2015-11-22 DIAGNOSIS — I10 Essential (primary) hypertension: Secondary | ICD-10-CM | POA: Diagnosis not present

## 2015-11-22 DIAGNOSIS — E782 Mixed hyperlipidemia: Secondary | ICD-10-CM | POA: Diagnosis not present

## 2015-11-22 LAB — CBC
HEMATOCRIT: 40.8 % (ref 39.0–52.0)
HEMOGLOBIN: 14 g/dL (ref 13.0–17.0)
MCHC: 34.3 g/dL (ref 30.0–36.0)
MCV: 92.6 fl (ref 78.0–100.0)
PLATELETS: 341 10*3/uL (ref 150.0–400.0)
RBC: 4.41 Mil/uL (ref 4.22–5.81)
RDW: 13.1 % (ref 11.5–15.5)
WBC: 8.1 10*3/uL (ref 4.0–10.5)

## 2015-11-22 LAB — COMPREHENSIVE METABOLIC PANEL
ALBUMIN: 4.8 g/dL (ref 3.5–5.2)
ALK PHOS: 68 U/L (ref 39–117)
ALT: 39 U/L (ref 0–53)
AST: 18 U/L (ref 0–37)
BILIRUBIN TOTAL: 0.6 mg/dL (ref 0.2–1.2)
BUN: 15 mg/dL (ref 6–23)
CO2: 27 mEq/L (ref 19–32)
CREATININE: 0.8 mg/dL (ref 0.40–1.50)
Calcium: 10 mg/dL (ref 8.4–10.5)
Chloride: 96 mEq/L (ref 96–112)
GFR: 105.36 mL/min (ref >60.00)
Glucose, Bld: 157 mg/dL — ABNORMAL HIGH (ref 70–99)
POTASSIUM: 4.2 meq/L (ref 3.5–5.1)
SODIUM: 132 meq/L — AB (ref 135–145)
TOTAL PROTEIN: 7.9 g/dL (ref 6.0–8.3)

## 2015-11-22 LAB — HEMOGLOBIN A1C: Hgb A1c MFr Bld: 7 % — ABNORMAL HIGH (ref 4.6–6.5)

## 2015-11-22 LAB — LIPID PANEL
CHOLESTEROL: 122 mg/dL (ref 0–200)
HDL: 40.2 mg/dL (ref >39.00)
LDL Cholesterol: 56 mg/dL (ref 0–99)
NonHDL: 82.03
Total CHOL/HDL Ratio: 3
Triglycerides: 131 mg/dL (ref 0.0–149.0)
VLDL: 26.2 mg/dL (ref 0.0–40.0)

## 2015-11-22 NOTE — Addendum Note (Signed)
Addended by: Caffie Pinto on: 11/22/2015 09:17 AM   Modules accepted: Orders

## 2015-11-29 ENCOUNTER — Ambulatory Visit: Payer: BC Managed Care – PPO | Admitting: Family Medicine

## 2015-12-03 ENCOUNTER — Ambulatory Visit (INDEPENDENT_AMBULATORY_CARE_PROVIDER_SITE_OTHER): Payer: BC Managed Care – PPO | Admitting: Family Medicine

## 2015-12-03 ENCOUNTER — Encounter: Payer: Self-pay | Admitting: Family Medicine

## 2015-12-03 VITALS — BP 144/80 | HR 88 | Temp 98.5°F | Ht 69.0 in | Wt 244.8 lb

## 2015-12-03 DIAGNOSIS — Z87891 Personal history of nicotine dependence: Secondary | ICD-10-CM

## 2015-12-03 DIAGNOSIS — E291 Testicular hypofunction: Secondary | ICD-10-CM

## 2015-12-03 DIAGNOSIS — E782 Mixed hyperlipidemia: Secondary | ICD-10-CM | POA: Diagnosis not present

## 2015-12-03 DIAGNOSIS — I10 Essential (primary) hypertension: Secondary | ICD-10-CM | POA: Diagnosis not present

## 2015-12-03 DIAGNOSIS — N521 Erectile dysfunction due to diseases classified elsewhere: Secondary | ICD-10-CM

## 2015-12-03 DIAGNOSIS — E119 Type 2 diabetes mellitus without complications: Secondary | ICD-10-CM

## 2015-12-03 DIAGNOSIS — E669 Obesity, unspecified: Secondary | ICD-10-CM | POA: Diagnosis not present

## 2015-12-03 DIAGNOSIS — R5383 Other fatigue: Secondary | ICD-10-CM

## 2015-12-03 DIAGNOSIS — E349 Endocrine disorder, unspecified: Secondary | ICD-10-CM

## 2015-12-03 DIAGNOSIS — E1169 Type 2 diabetes mellitus with other specified complication: Secondary | ICD-10-CM

## 2015-12-03 LAB — TESTOSTERONE: TESTOSTERONE: 153.56 ng/dL — AB (ref 300.00–890.00)

## 2015-12-03 LAB — T4, FREE: Free T4: 0.89 ng/dL (ref 0.60–1.60)

## 2015-12-03 LAB — TSH: TSH: 0.78 u[IU]/mL (ref 0.35–4.50)

## 2015-12-03 MED ORDER — LISINOPRIL-HYDROCHLOROTHIAZIDE 20-12.5 MG PO TABS: 1.0000 | ORAL_TABLET | Freq: Two times a day (BID) | ORAL | Status: DC

## 2015-12-03 NOTE — Patient Instructions (Signed)
Hypertension Hypertension, commonly called high blood pressure, is when the force of blood pumping through your arteries is too strong. Your arteries are the blood vessels that carry blood from your heart throughout your body. A blood pressure reading consists of a higher number over a lower number, such as 110/72. The higher number (systolic) is the pressure inside your arteries when your heart pumps. The lower number (diastolic) is the pressure inside your arteries when your heart relaxes. Ideally you want your blood pressure below 120/80. Hypertension forces your heart to work harder to pump blood. Your arteries may become narrow or stiff. Having untreated or uncontrolled hypertension can cause heart attack, stroke, kidney disease, and other problems. RISK FACTORS Some risk factors for high blood pressure are controllable. Others are not.  Risk factors you cannot control include:   Race. You may be at higher risk if you are African American.  Age. Risk increases with age.  Gender. Men are at higher risk than women before age 45 years. After age 65, women are at higher risk than men. Risk factors you can control include:  Not getting enough exercise or physical activity.  Being overweight.  Getting too much fat, sugar, calories, or salt in your diet.  Drinking too much alcohol. SIGNS AND SYMPTOMS Hypertension does not usually cause signs or symptoms. Extremely high blood pressure (hypertensive crisis) may cause headache, anxiety, shortness of breath, and nosebleed. DIAGNOSIS To check if you have hypertension, your health care provider will measure your blood pressure while you are seated, with your arm held at the level of your heart. It should be measured at least twice using the same arm. Certain conditions can cause a difference in blood pressure between your right and left arms. A blood pressure reading that is higher than normal on one occasion does not mean that you need treatment. If  it is not clear whether you have high blood pressure, you may be asked to return on a different day to have your blood pressure checked again. Or, you may be asked to monitor your blood pressure at home for 1 or more weeks. TREATMENT Treating high blood pressure includes making lifestyle changes and possibly taking medicine. Living a healthy lifestyle can help lower high blood pressure. You may need to change some of your habits. Lifestyle changes may include:  Following the DASH diet. This diet is high in fruits, vegetables, and whole grains. It is low in salt, red meat, and added sugars.  Keep your sodium intake below 2,300 mg per day.  Getting at least 30-45 minutes of aerobic exercise at least 4 times per week.  Losing weight if necessary.  Not smoking.  Limiting alcoholic beverages.  Learning ways to reduce stress. Your health care provider may prescribe medicine if lifestyle changes are not enough to get your blood pressure under control, and if one of the following is true:  You are 18-59 years of age and your systolic blood pressure is above 140.  You are 60 years of age or older, and your systolic blood pressure is above 150.  Your diastolic blood pressure is above 90.  You have diabetes, and your systolic blood pressure is over 140 or your diastolic blood pressure is over 90.  You have kidney disease and your blood pressure is above 140/90.  You have heart disease and your blood pressure is above 140/90. Your personal target blood pressure may vary depending on your medical conditions, your age, and other factors. HOME CARE INSTRUCTIONS    Have your blood pressure rechecked as directed by your health care provider.   Take medicines only as directed by your health care provider. Follow the directions carefully. Blood pressure medicines must be taken as prescribed. The medicine does not work as well when you skip doses. Skipping doses also puts you at risk for  problems.  Do not smoke.   Monitor your blood pressure at home as directed by your health care provider. SEEK MEDICAL CARE IF:   You think you are having a reaction to medicines taken.  You have recurrent headaches or feel dizzy.  You have swelling in your ankles.  You have trouble with your vision. SEEK IMMEDIATE MEDICAL CARE IF:  You develop a severe headache or confusion.  You have unusual weakness, numbness, or feel faint.  You have severe chest or abdominal pain.  You vomit repeatedly.  You have trouble breathing. MAKE SURE YOU:   Understand these instructions.  Will watch your condition.  Will get help right away if you are not doing well or get worse.   This information is not intended to replace advice given to you by your health care provider. Make sure you discuss any questions you have with your health care provider.   Document Released: 08/04/2005 Document Revised: 12/19/2014 Document Reviewed: 05/27/2013 Elsevier Interactive Patient Education 2016 [employer] Inc[employer]  

## 2015-12-03 NOTE — Assessment & Plan Note (Signed)
Well controlled, no changes to meds. Encouraged heart healthy diet such as the DASH diet and exercise as tolerated.  °

## 2015-12-03 NOTE — Assessment & Plan Note (Signed)
hgba1c acceptable, minimize simple carbs. Increase exercise as tolerated. Continue current meds 

## 2015-12-03 NOTE — Progress Notes (Signed)
Subjective:    Patient ID: Phillip Doyle, male    DOB: Mar 15, 1956, 60 y.o.   MRN: [id number]  Chief Complaint  Patient presents with  . Follow-up    HPI Patient is in today for follow up. He is feeling well today. He acknowledges some fatigue and myalgias daily. No recent illness or acute complaints. Denies polyuria or polydipsia. Denies CP/palp/SOB/HA/congestion/fevers/GI or GU c/o. Taking meds as prescribed  Past Medical History  Diagnosis Date  . Allergy     rhinitis  . Hypertension   . Obesity   . Ulcer 1982    peptic ulcer disease  . Impaired glucose tolerance   . Diabetes mellitus   . TOBACCO ABUSE 07/04/2010  . PEPTIC ULCER DISEASE 02/15/2007  . NISSEN FUNDOPLICATION, HX OF 123456  . Mixed hyperlipidemia 07/04/2010  . LUMBAR STRAIN 10/17/2010  . HYPERTENSION 02/15/2007  . EXOGENOUS OBESITY 02/15/2007  . DM 10/24/2010  . ALLERGIC RHINITIS 02/15/2007  . HTN (hypertension) 08/20/2011  . OA (osteoarthritis) of knee 02/17/2012  . ED (erectile dysfunction) 02/17/2012  . Bronchitis 07/14/2012  . Preventative health care 07/19/2012  . Personal history of colonic polyps 01/17/2013    colonoscoppy 2013, benign polyp advised repeat in 10 years  . Alcohol abuse 11/28/2013  . H/O tobacco use, presenting hazards to health 07/04/2010    Qualifier: Diagnosis of  By: [name] MD, Erline Levine  1/2 ppd   . H/O alcohol abuse 11/28/2013    Past Surgical History  Procedure Laterality Date  . Rotator cuff repair  2000  . Vasectomy  1982  . Fundiplication for hh and reflux  1987  . Negative stress test  2008  . Colonoscopy  03/2012    Several hyperplastic polyps    Family History  Problem Relation Age of Onset  . Asthma Mother   . Cancer Mother     uterine   . Alcohol abuse Father   . Stroke Maternal Grandmother     possibly  . Hypertension Maternal Grandfather   . Hypertension Brother   . Cancer Brother     prostate  . Colon cancer Neg Hx     Social History   Social History  . Marital  Status: Married    Spouse Name: [name]  . Number of Children: N/A  . Years of Education: N/A   Occupational History  .  [employer]   Social History Main Topics  . Smoking status: Former Smoker    Types: Cigarettes    Quit date: 06/28/2014  . Smokeless tobacco: Former Systems developer    Quit date: 03/18/2012     Comment: quit 06/18/10- started back   . Alcohol Use: 0.0 oz/week    3-5 Cans of beer per week     Comment: 3-5 beer daily  . Drug Use: No  . Sexual Activity:    Partners: Female   Other Topics Concern  . Not on file   Social History Narrative    Outpatient Prescriptions Prior to Visit  Medication Sig Dispense Refill  . albuterol (PROVENTIL HFA;VENTOLIN HFA) 108 (90 BASE) MCG/ACT inhaler Inhale 2 puffs into the lungs every 6 (six) hours as needed for wheezing. 3 Inhaler 3  . fish oil-omega-3 fatty acids 1000 MG capsule Take 2 g by mouth daily.      Marland Kitchen glucose blood test strip Check blood sugar twice daily. DX: E11.9 100 each 6  . Lancets (FREESTYLE) lancets Check blood sugar twice daily.  DX E11.9 100 each 6  . linagliptin (TRADJENTA)  5 MG TABS tablet Take 1 tablet (5 mg total) by mouth daily. 90 tablet 2  . metFORMIN (GLUCOPHAGE) 1000 MG tablet TAKE 1 TABLET BY MOUTH 2 TIMES DAILY WITH A MEAL. 180 tablet 2  . metoprolol (TOPROL-XL) 200 MG 24 hr tablet Take 1 tablet (200 mg total) by mouth daily. 90 tablet 3  . Multiple Vitamins-Minerals (PA MENS 50 PLUS VITAPAK PO) Take 1 capsule by mouth daily.    . simvastatin (ZOCOR) 10 MG tablet Take 1 tablet (10 mg total) by mouth daily at 6 PM. 90 tablet 3  . TRADJENTA 5 MG TABS tablet TAKE 1 TABLET BY MOUTH EVERY DAY 30 tablet 6  . cephALEXin (KEFLEX) 500 MG capsule Take 1 capsule (500 mg total) by mouth 4 (four) times daily. 12 capsule 0  . lisinopril-hydrochlorothiazide (PRINZIDE,ZESTORETIC) 20-12.5 MG tablet Take 1 tablet by mouth 2 (two) times daily. 180 tablet 2  . lisinopril-hydrochlorothiazide (PRINZIDE,ZESTORETIC) 20-12.5 MG tablet TAKE  1 TABLET TWICE A DAY 180 tablet 1  . lisinopril-hydrochlorothiazide (PRINZIDE,ZESTORETIC) 20-12.5 MG tablet TAKE 1 TABLET TWICE A DAY 180 tablet 1  . metFORMIN (GLUCOPHAGE) 1000 MG tablet TAKE 1 TABLET BY MOUTH 2 TIMES DAILY WITH A MEAL. 180 tablet 1  . metFORMIN (GLUCOPHAGE) 1000 MG tablet TAKE 1 TABLET BY MOUTH 2 TIMES DAILY WITH A MEAL. 180 tablet 0   No facility-administered medications prior to visit.    No Known Allergies  Review of Systems  Constitutional: Positive for malaise/fatigue. Negative for fever.  HENT: Negative for congestion.   Eyes: Negative for blurred vision.  Respiratory: Negative for shortness of breath.   Cardiovascular: Negative for chest pain, palpitations and leg swelling.  Gastrointestinal: Negative for nausea, abdominal pain and blood in stool.  Genitourinary: Negative for dysuria and frequency.  Musculoskeletal: Negative for falls.  Skin: Negative for rash.  Neurological: Negative for dizziness, loss of consciousness and headaches.  Endo/Heme/Allergies: Negative for environmental allergies.  Psychiatric/Behavioral: Negative for depression. The patient is not nervous/anxious.        Objective:    Physical Exam  Constitutional: He is oriented to person, place, and time. He appears well-developed and well-nourished. No distress.  HENT:  Head: Normocephalic and atraumatic.  Nose: Nose normal.  Eyes: Right eye exhibits no discharge. Left eye exhibits no discharge.  Neck: Normal range of motion. Neck supple.  Cardiovascular: Normal rate and regular rhythm.   No murmur heard. Pulmonary/Chest: Effort normal and breath sounds normal.  Abdominal: Soft. Bowel sounds are normal. There is no tenderness.  Musculoskeletal: He exhibits no edema.  Neurological: He is alert and oriented to person, place, and time.  Skin: Skin is warm and dry.  Psychiatric: He has a normal mood and affect.  Nursing note and vitals reviewed.   BP 144/80 mmHg  Pulse 88   Temp(Src) 98.5 F (36.9 C) (Oral)  Ht 5\' 9"  (1.753 m)  Wt 244 lb 12.8 oz (111.041 kg)  BMI 36.13 kg/m2  SpO2 92% Wt Readings from Last 3 Encounters:  12/03/15 244 lb 12.8 oz (111.041 kg)  07/16/15 246 lb 6.4 oz (111.766 kg)  07/09/15 242 lb 4.8 oz (109.907 kg)     Lab Results  Component Value Date   WBC 8.1 11/22/2015   HGB 14.0 11/22/2015   HCT 40.8 11/22/2015   PLT 341.0 11/22/2015   GLUCOSE 157* 11/22/2015   CHOL 122 11/22/2015   TRIG 131.0 11/22/2015   HDL 40.20 11/22/2015   LDLDIRECT 101.3 01/28/2008   LDLCALC 56 11/22/2015  ALT 39 11/22/2015   AST 18 11/22/2015   NA 132* 11/22/2015   K 4.2 11/22/2015   CL 96 11/22/2015   CREATININE 0.80 11/22/2015   BUN 15 11/22/2015   CO2 27 11/22/2015   TSH 0.78 12/03/2015   PSA 0.34 11/23/2014   HGBA1C 7.0* 11/22/2015   MICROALBUR 2.5* 05/31/2015    Lab Results  Component Value Date   TSH 0.78 12/03/2015   Lab Results  Component Value Date   WBC 8.1 11/22/2015   HGB 14.0 11/22/2015   HCT 40.8 11/22/2015   MCV 92.6 11/22/2015   PLT 341.0 11/22/2015   Lab Results  Component Value Date   NA 132* 11/22/2015   K 4.2 11/22/2015   CO2 27 11/22/2015   GLUCOSE 157* 11/22/2015   BUN 15 11/22/2015   CREATININE 0.80 11/22/2015   BILITOT 0.6 11/22/2015   ALKPHOS 68 11/22/2015   AST 18 11/22/2015   ALT 39 11/22/2015   PROT 7.9 11/22/2015   ALBUMIN 4.8 11/22/2015   CALCIUM 10.0 11/22/2015   GFR 105.36 11/22/2015   Lab Results  Component Value Date   CHOL 122 11/22/2015   Lab Results  Component Value Date   HDL 40.20 11/22/2015   Lab Results  Component Value Date   LDLCALC 56 11/22/2015   Lab Results  Component Value Date   TRIG 131.0 11/22/2015   Lab Results  Component Value Date   CHOLHDL 3 11/22/2015   Lab Results  Component Value Date   HGBA1C 7.0* 11/22/2015       Assessment & Plan:   Problem List Items Addressed This Visit    Diabetes mellitus type 2 in obese ([place])    hgba1c acceptable,  minimize simple carbs. Increase exercise as tolerated. Continue current meds      Relevant Medications   lisinopril-hydrochlorothiazide (PRINZIDE,ZESTORETIC) 20-12.5 MG tablet   Other Relevant Orders   TSH (Completed)   T4, free (Completed)   Testosterone (Completed)   TSH   Lipid panel   Hemoglobin A1c   Comprehensive metabolic panel   CBC   ED (erectile dysfunction)   Relevant Orders   TSH (Completed)   T4, free (Completed)   Testosterone (Completed)   TSH   Lipid panel   Hemoglobin A1c   Comprehensive metabolic panel   CBC   Testos,Total,Free and SHBG (Male)   H/O tobacco use, presenting hazards to health    Has avoided cigarettes for 2 years but smoked 2 to 2.5 ppd since age 37 year old      Relevant Orders   TSH (Completed)   T4, free (Completed)   Testosterone (Completed)   TSH   Lipid panel   Hemoglobin A1c   Comprehensive metabolic panel   CBC   HTN (hypertension) - Primary    Well controlled, no changes to meds. Encouraged heart healthy diet such as the DASH diet and exercise as tolerated.       Relevant Medications   lisinopril-hydrochlorothiazide (PRINZIDE,ZESTORETIC) 20-12.5 MG tablet   Other Relevant Orders   TSH (Completed)   T4, free (Completed)   Testosterone (Completed)   TSH   Lipid panel   Hemoglobin A1c   Comprehensive metabolic panel   CBC   MIXED HYPERLIPIDEMIA    Tolerating statin, encouraged heart healthy diet, avoid trans fats, minimize simple carbs and saturated fats. Increase exercise as tolerated      Relevant Medications   lisinopril-hydrochlorothiazide (PRINZIDE,ZESTORETIC) 20-12.5 MG tablet   Other Relevant Orders   TSH (Completed)  T4, free (Completed)   Testosterone (Completed)   TSH   Lipid panel   Hemoglobin A1c   Comprehensive metabolic panel   CBC   Obesity    Encouraged DASH diet, decrease po intake and increase exercise as tolerated. Needs 7-8 hours of sleep nightly. Avoid trans fats, eat small, frequent  meals every 4-5 hours with lean proteins, complex carbs and healthy fats. Minimize simple carbs, GMO foods.      Relevant Orders   TSH (Completed)   T4, free (Completed)   Testosterone (Completed)   TSH   Lipid panel   Hemoglobin A1c   Comprehensive metabolic panel   CBC    Other Visit Diagnoses    Other fatigue        Relevant Orders    TSH (Completed)    T4, free (Completed)    Testosterone (Completed)    TSH    Lipid panel    Hemoglobin A1c    Comprehensive metabolic panel    CBC    Testosterone deficiency        Relevant Orders    Testos,Total,Free and SHBG (Male)       I have discontinued Mr. Griebel's lisinopril-hydrochlorothiazide, cephALEXin, and lisinopril-hydrochlorothiazide. I have also changed his lisinopril-hydrochlorothiazide. Additionally, I am having him maintain his fish oil-omega-3 fatty acids, Multiple Vitamins-Minerals (PA MENS 50 PLUS VITAPAK PO), albuterol, linagliptin, simvastatin, metoprolol, metFORMIN, freestyle, glucose blood, and TRADJENTA.  Meds ordered this encounter  Medications  . lisinopril-hydrochlorothiazide (PRINZIDE,ZESTORETIC) 20-12.5 MG tablet    Sig: Take 1 tablet by mouth 2 (two) times daily.    Dispense:  180 tablet    Refill:  1  . DISCONTD: Testosterone (ANDROGEL) 40.5 MG/2.5GM (1.62%) GEL    Sig: Place 2.5 application onto the skin once.    Dispense:  2.5 g    Refill:  3     Penni Homans, MD

## 2015-12-03 NOTE — Assessment & Plan Note (Signed)
Encouraged DASH diet, decrease po intake and increase exercise as tolerated. Needs 7-8 hours of sleep nightly. Avoid trans fats, eat small, frequent meals every 4-5 hours with lean proteins, complex carbs and healthy fats. Minimize simple carbs, GMO foods. 

## 2015-12-03 NOTE — Assessment & Plan Note (Signed)
Has avoided cigarettes for 2 years but smoked 2 to 2.5 ppd since age 60 year old

## 2015-12-03 NOTE — Assessment & Plan Note (Signed)
Tolerating statin, encouraged heart healthy diet, avoid trans fats, minimize simple carbs and saturated fats. Increase exercise as tolerated 

## 2015-12-04 ENCOUNTER — Other Ambulatory Visit: Payer: Self-pay | Admitting: Family Medicine

## 2015-12-04 ENCOUNTER — Telehealth: Payer: Self-pay | Admitting: *Deleted

## 2015-12-04 ENCOUNTER — Other Ambulatory Visit: Payer: Self-pay | Admitting: *Deleted

## 2015-12-04 MED ORDER — TESTOSTERONE 20.25 MG/ACT (1.62%) TD GEL: TRANSDERMAL | Status: DC

## 2015-12-04 MED ORDER — TESTOSTERONE 40.5 MG/2.5GM (1.62%) TD GEL: 2.5000 "application " | Freq: Once | TRANSDERMAL | Status: DC

## 2015-12-04 NOTE — Telephone Encounter (Signed)
Received request for PA on Testosterone; Initiated via Cover [employer] Meds, awaiting response/SLS 04/18

## 2015-12-04 NOTE — Telephone Encounter (Signed)
Faxed hardcopy for Testosterone to [facility] [place].

## 2015-12-04 NOTE — Progress Notes (Unsigned)
Correction made to Rx per provider in amount to be used daily; Initiated PA via [facility], awaiting response/SLS 04/18

## 2015-12-05 NOTE — Telephone Encounter (Signed)
Please mail the prescription

## 2016-01-23 ENCOUNTER — Other Ambulatory Visit: Payer: Self-pay | Admitting: Family Medicine

## 2016-03-03 ENCOUNTER — Other Ambulatory Visit (INDEPENDENT_AMBULATORY_CARE_PROVIDER_SITE_OTHER): Payer: BC Managed Care – PPO

## 2016-03-03 DIAGNOSIS — E669 Obesity, unspecified: Secondary | ICD-10-CM

## 2016-03-03 DIAGNOSIS — I10 Essential (primary) hypertension: Secondary | ICD-10-CM

## 2016-03-03 DIAGNOSIS — R5383 Other fatigue: Secondary | ICD-10-CM

## 2016-03-03 DIAGNOSIS — E1169 Type 2 diabetes mellitus with other specified complication: Secondary | ICD-10-CM

## 2016-03-03 DIAGNOSIS — N521 Erectile dysfunction due to diseases classified elsewhere: Secondary | ICD-10-CM

## 2016-03-03 DIAGNOSIS — E782 Mixed hyperlipidemia: Secondary | ICD-10-CM

## 2016-03-03 DIAGNOSIS — E119 Type 2 diabetes mellitus without complications: Secondary | ICD-10-CM

## 2016-03-03 DIAGNOSIS — Z87891 Personal history of nicotine dependence: Secondary | ICD-10-CM

## 2016-03-03 DIAGNOSIS — E349 Endocrine disorder, unspecified: Secondary | ICD-10-CM

## 2016-03-03 LAB — HEMOGLOBIN A1C: Hgb A1c MFr Bld: 8 % — ABNORMAL HIGH (ref 4.6–6.5)

## 2016-03-03 LAB — COMPREHENSIVE METABOLIC PANEL
ALK PHOS: 70 U/L (ref 39–117)
ALT: 23 U/L (ref 0–53)
AST: 13 U/L (ref 0–37)
Albumin: 4.4 g/dL (ref 3.5–5.2)
BILIRUBIN TOTAL: 0.3 mg/dL (ref 0.2–1.2)
BUN: 20 mg/dL (ref 6–23)
CALCIUM: 9.6 mg/dL (ref 8.4–10.5)
CO2: 29 meq/L (ref 19–32)
CREATININE: 0.79 mg/dL (ref 0.40–1.50)
Chloride: 95 mEq/L — ABNORMAL LOW (ref 96–112)
GFR: 106.79 mL/min (ref >60.00)
Glucose, Bld: 195 mg/dL — ABNORMAL HIGH (ref 70–99)
Potassium: 3.9 mEq/L (ref 3.5–5.1)
Sodium: 133 mEq/L — ABNORMAL LOW (ref 135–145)
TOTAL PROTEIN: 7.3 g/dL (ref 6.0–8.3)

## 2016-03-03 LAB — LIPID PANEL
CHOLESTEROL: 122 mg/dL (ref 0–200)
HDL: 34.5 mg/dL — AB (ref >39.00)
LDL Cholesterol: 65 mg/dL (ref 0–99)
NonHDL: 87.96
Total CHOL/HDL Ratio: 4
Triglycerides: 115 mg/dL (ref 0.0–149.0)
VLDL: 23 mg/dL (ref 0.0–40.0)

## 2016-03-03 LAB — TSH: TSH: 1.32 u[IU]/mL (ref 0.35–4.50)

## 2016-03-04 LAB — CBC
HCT: 37.9 % — ABNORMAL LOW (ref 39.0–52.0)
Hemoglobin: 12.9 g/dL — ABNORMAL LOW (ref 13.0–17.0)
MCHC: 33.9 g/dL (ref 30.0–36.0)
MCV: 91.7 fl (ref 78.0–100.0)
Platelets: 369 10*3/uL (ref 150.0–400.0)
RBC: 4.13 Mil/uL — AB (ref 4.22–5.81)
RDW: 13.6 % (ref 11.5–15.5)
WBC: 6.9 10*3/uL (ref 4.0–10.5)

## 2016-03-05 LAB — TESTOS,TOTAL,FREE AND SHBG (FEMALE)
SEX HORMONE BINDING GLOB.: 19 nmol/L — AB (ref 22–77)
TESTOSTERONE,FREE: 35.1 pg/mL (ref 35.0–155.0)
TESTOSTERONE,TOTAL,LC/MS/MS: 154 ng/dL — AB (ref 250–1100)

## 2016-03-06 ENCOUNTER — Ambulatory Visit: Payer: BC Managed Care – PPO | Admitting: Family Medicine

## 2016-03-14 ENCOUNTER — Ambulatory Visit: Payer: BC Managed Care – PPO | Admitting: Family Medicine

## 2016-04-15 ENCOUNTER — Ambulatory Visit (INDEPENDENT_AMBULATORY_CARE_PROVIDER_SITE_OTHER): Payer: BC Managed Care – PPO | Admitting: Family Medicine

## 2016-04-15 ENCOUNTER — Encounter: Payer: Self-pay | Admitting: Family Medicine

## 2016-04-15 VITALS — BP 134/78 | HR 88 | Temp 99.0°F | Ht 69.0 in | Wt 252.1 lb

## 2016-04-15 DIAGNOSIS — Z23 Encounter for immunization: Secondary | ICD-10-CM

## 2016-04-15 DIAGNOSIS — I10 Essential (primary) hypertension: Secondary | ICD-10-CM

## 2016-04-15 DIAGNOSIS — E119 Type 2 diabetes mellitus without complications: Secondary | ICD-10-CM | POA: Diagnosis not present

## 2016-04-15 DIAGNOSIS — Z0000 Encounter for general adult medical examination without abnormal findings: Secondary | ICD-10-CM

## 2016-04-15 DIAGNOSIS — E669 Obesity, unspecified: Secondary | ICD-10-CM

## 2016-04-15 DIAGNOSIS — E1169 Type 2 diabetes mellitus with other specified complication: Secondary | ICD-10-CM

## 2016-04-15 DIAGNOSIS — E782 Mixed hyperlipidemia: Secondary | ICD-10-CM

## 2016-04-15 DIAGNOSIS — Z87891 Personal history of nicotine dependence: Secondary | ICD-10-CM

## 2016-04-15 MED ORDER — GLIMEPIRIDE 1 MG PO TABS: 1.0000 mg | ORAL_TABLET | Freq: Every day | ORAL | 5 refills | Status: DC

## 2016-04-15 NOTE — Patient Instructions (Signed)

## 2016-04-15 NOTE — Progress Notes (Signed)
Pre visit review using our clinic review tool, if applicable. No additional management support is needed unless otherwise documented below in the visit note. 

## 2016-04-18 LAB — HM DIABETES EYE EXAM

## 2016-04-25 ENCOUNTER — Encounter: Payer: Self-pay | Admitting: Family Medicine

## 2016-04-27 NOTE — Assessment & Plan Note (Signed)
Well controlled, no changes to meds. Encouraged heart healthy diet such as the DASH diet and exercise as tolerated.  °

## 2016-04-27 NOTE — Assessment & Plan Note (Signed)
Tolerating statin, encouraged heart healthy diet, avoid trans fats, minimize simple carbs and saturated fats. Increase exercise as tolerated 

## 2016-04-27 NOTE — Progress Notes (Signed)
Patient ID: Phillip Doyle, male   DOB: Dec 31, 1955, 60 y.o.   MRN: [id number]   Subjective:    Patient ID: Phillip Doyle, male    DOB: August 10, 1956, 60 y.o.   MRN: [id number]  Chief Complaint  Patient presents with  . Follow-up    HPI Patient is in today for follow up. He acknowledges he continues to try and eat better but lately has had more carbohydrate intake. No polyuria or polydipsia. Denies CP/palp/SOB/HA/congestion/fevers/GI or GU c/o. Taking meds as prescribed  Past Medical History:  Diagnosis Date  . Alcohol abuse 11/28/2013  . ALLERGIC RHINITIS 02/15/2007  . Allergy    rhinitis  . Bronchitis 07/14/2012  . Diabetes mellitus   . DM 10/24/2010  . ED (erectile dysfunction) 02/17/2012  . EXOGENOUS OBESITY 02/15/2007  . H/O alcohol abuse 11/28/2013  . H/O tobacco use, presenting hazards to health 07/04/2010   Qualifier: Diagnosis of  By: [name] MD, Erline Levine  1/2 ppd   . HTN (hypertension) 08/20/2011  . Hypertension   . HYPERTENSION 02/15/2007  . Impaired glucose tolerance   . LUMBAR STRAIN 10/17/2010  . Mixed hyperlipidemia 07/04/2010  . NISSEN FUNDOPLICATION, HX OF 123456  . OA (osteoarthritis) of knee 02/17/2012  . Obesity   . PEPTIC ULCER DISEASE 02/15/2007  . Personal history of colonic polyps 01/17/2013   colonoscoppy 2013, benign polyp advised repeat in 10 years  . Preventative health care 07/19/2012  . TOBACCO ABUSE 07/04/2010  . Ulcer 1982   peptic ulcer disease    Past Surgical History:  Procedure Laterality Date  . COLONOSCOPY  03/2012   Several hyperplastic polyps  . fundiplication for [facility] and reflux  1987  . negative stress test  2008  . ROTATOR CUFF REPAIR  2000  . VASECTOMY  1982    Family History  Problem Relation Age of Onset  . Asthma Mother   . Cancer Mother     uterine   . Alcohol abuse Father   . Stroke Maternal Grandmother     possibly  . Hypertension Maternal Grandfather   . Hypertension Brother   . Cancer Brother     prostate  . Colon cancer Neg Hx      Social History   Social History  . Marital status: Married    Spouse name: [name]  . Number of children: N/A  . 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 Smoking status: Former Smoker    Types: Cigarettes    Quit date: 06/28/2014  . Smokeless tobacco: Former Systems developer    Quit date: 03/18/2012     Comment: quit 06/18/10- started back   . Alcohol use 0.0 oz/week    3 - 5 Cans of beer per week     Comment: 3-5 beer daily  . Drug use: No  . Sexual activity: Yes    Partners: Female   Other Topics Concern  . Not on file   Social History Narrative  . No narrative on file    Outpatient Medications Prior to Visit  Medication Sig Dispense Refill  . glucose blood test strip Check blood sugar twice daily. DX: E11.9 100 each 6  . Lancets (FREESTYLE) lancets Check blood sugar twice daily.  DX E11.9 100 each 6  . lisinopril-hydrochlorothiazide (PRINZIDE,ZESTORETIC) 20-12.5 MG tablet Take 1 tablet by mouth 2 (two) times daily. 180 tablet 1  . metFORMIN (GLUCOPHAGE) 1000 MG tablet TAKE 1 TABLET BY MOUTH 2  TIMES DAILY WITH A MEAL. 180 tablet 2  . metoprolol (TOPROL-XL) 200 MG 24 hr tablet Take 1 tablet (200 mg total) by mouth daily. 90 tablet 3  . Multiple Vitamins-Minerals (PA MENS 50 PLUS VITAPAK PO) Take 1 capsule by mouth daily.    . simvastatin (ZOCOR) 10 MG tablet TAKE 1 TABLET BY MOUTH DAILY AT 6 PM. 90 tablet 1  . TRADJENTA 5 MG TABS tablet TAKE 1 TABLET BY MOUTH EVERY DAY 30 tablet 6  . albuterol (PROVENTIL HFA;VENTOLIN HFA) 108 (90 BASE) MCG/ACT inhaler Inhale 2 puffs into the lungs every 6 (six) hours as needed for wheezing. 3 Inhaler 3  . fish oil-omega-3 fatty acids 1000 MG capsule Take 2 g by mouth daily.      Marland Kitchen linagliptin (TRADJENTA) 5 MG TABS tablet Take 1 tablet (5 mg total) by mouth daily. 90 tablet 2  . Testosterone 20.25 MG/ACT (1.62%) GEL PLACE [2] TWO PUMPS/ACT [40.50 MG TOTAL] ONTO SKIN ONCE DAILY 75 g 3   No  facility-administered medications prior to visit.     No Known Allergies  Review of Systems  Constitutional: Positive for malaise/fatigue. Negative for fever.  HENT: Negative for congestion.   Eyes: Negative for blurred vision.  Respiratory: Negative for shortness of breath.   Cardiovascular: Negative for chest pain, palpitations and leg swelling.  Gastrointestinal: Negative for abdominal pain, blood in stool and nausea.  Genitourinary: Negative for dysuria and frequency.  Musculoskeletal: Positive for back pain and joint pain. Negative for falls.  Skin: Negative for rash.  Neurological: Negative for dizziness, loss of consciousness and headaches.  Endo/Heme/Allergies: Negative for environmental allergies.  Psychiatric/Behavioral: Negative for depression. The patient is not nervous/anxious.        Objective:    Physical Exam  Constitutional: He is oriented to person, place, and time. He appears well-developed and well-nourished. No distress.  HENT:  Head: Normocephalic and atraumatic.  Nose: Nose normal.  Eyes: Right eye exhibits no discharge. Left eye exhibits no discharge.  Neck: Normal range of motion. Neck supple.  Cardiovascular: Normal rate and regular rhythm.   Pulmonary/Chest: Effort normal and breath sounds normal.  Abdominal: Soft. Bowel sounds are normal. There is no tenderness.  Musculoskeletal: He exhibits no edema.  Neurological: He is alert and oriented to person, place, and time.  Skin: Skin is warm and dry.  Psychiatric: He has a normal mood and affect.  Nursing note and vitals reviewed.   BP 134/78   Pulse 88   Temp 99 F (37.2 C) (Oral)   Ht 5\' 9"  (1.753 m)   Wt 252 lb 2 oz (114.4 kg)   SpO2 95%   BMI 37.23 kg/m  Wt Readings from Last 3 Encounters:  04/15/16 252 lb 2 oz (114.4 kg)  12/03/15 244 lb 12.8 oz (111 kg)  07/16/15 246 lb 6.4 oz (111.8 kg)     Lab Results  Component Value Date   WBC 6.9 03/03/2016   HGB 12.9 (L) 03/03/2016   HCT  37.9 (L) 03/03/2016   PLT 369.0 03/03/2016   GLUCOSE 195 (H) 03/03/2016   CHOL 122 03/03/2016   TRIG 115.0 03/03/2016   HDL 34.50 (L) 03/03/2016   LDLDIRECT 101.3 01/28/2008   LDLCALC 65 03/03/2016   ALT 23 03/03/2016   AST 13 03/03/2016   NA 133 (L) 03/03/2016   K 3.9 03/03/2016   CL 95 (L) 03/03/2016   CREATININE 0.79 03/03/2016   BUN 20 03/03/2016   CO2 29 03/03/2016   TSH  1.32 03/03/2016   PSA 0.34 11/23/2014   HGBA1C 8.0 (H) 03/03/2016   MICROALBUR 2.5 (H) 05/31/2015    Lab Results  Component Value Date   TSH 1.32 03/03/2016   Lab Results  Component Value Date   WBC 6.9 03/03/2016   HGB 12.9 (L) 03/03/2016   HCT 37.9 (L) 03/03/2016   MCV 91.7 03/03/2016   PLT 369.0 03/03/2016   Lab Results  Component Value Date   NA 133 (L) 03/03/2016   K 3.9 03/03/2016   CO2 29 03/03/2016   GLUCOSE 195 (H) 03/03/2016   BUN 20 03/03/2016   CREATININE 0.79 03/03/2016   BILITOT 0.3 03/03/2016   ALKPHOS 70 03/03/2016   AST 13 03/03/2016   ALT 23 03/03/2016   PROT 7.3 03/03/2016   ALBUMIN 4.4 03/03/2016   CALCIUM 9.6 03/03/2016   GFR 106.79 03/03/2016   Lab Results  Component Value Date   CHOL 122 03/03/2016   Lab Results  Component Value Date   HDL 34.50 (L) 03/03/2016   Lab Results  Component Value Date   LDLCALC 65 03/03/2016   Lab Results  Component Value Date   TRIG 115.0 03/03/2016   Lab Results  Component Value Date   CHOLHDL 4 03/03/2016   Lab Results  Component Value Date   HGBA1C 8.0 (H) 03/03/2016       Assessment & Plan:   Problem List Items Addressed This Visit    MIXED HYPERLIPIDEMIA - Primary    Tolerating statin, encouraged heart healthy diet, avoid trans fats, minimize simple carbs and saturated fats. Increase exercise as tolerated      Relevant Orders   Lipid panel   Obesity    Encouraged DASH diet, decrease po intake and increase exercise as tolerated. Needs 7-8 hours of sleep nightly. Avoid trans fats, eat small, frequent  meals every 4-5 hours with lean proteins, complex carbs and healthy fats. Minimize simple carbs      Relevant Medications   glimepiride (AMARYL) 1 MG tablet   H/O tobacco use, presenting hazards to health    Continues to abstain      Diabetes mellitus type 2 in obese (HCC)    hgba1c unacceptable, minimize simple carbs. Increase exercise as tolerated. Continue current meds and add Glimeperide 1 mg daily. He acknowledges he has been eating badly. Will redouble his efforts and we will repeat labs in 3 months      Relevant Medications   glimepiride (AMARYL) 1 MG tablet   Other Relevant Orders   Hemoglobin A1c   HTN (hypertension)    Well controlled, no changes to meds. Encouraged heart healthy diet such as the DASH diet and exercise as tolerated.       Relevant Orders   TSH   CBC   Comprehensive metabolic panel   Preventative health care   Relevant Orders   HIV antibody (with reflex)   Hepatitis C Antibody    Other Visit Diagnoses    Encounter for immunization       Relevant Medications   vitamin B-12 (CYANOCOBALAMIN) 100 MCG tablet   glimepiride (AMARYL) 1 MG tablet   Other Relevant Orders   Flu Vaccine QUAD 36+ mos IM (Completed)      I have discontinued Mr. Huseman's fish oil-omega-3 fatty acids, albuterol, linagliptin, and Testosterone. I am also having him start on glimepiride. Additionally, I am having him maintain his Multiple Vitamins-Minerals (PA MENS 50 PLUS VITAPAK PO), metoprolol, metFORMIN, freestyle, glucose blood, TRADJENTA, lisinopril-hydrochlorothiazide, simvastatin, and vitamin B-12.  Meds ordered this encounter  Medications  . vitamin B-12 (CYANOCOBALAMIN) 100 MCG tablet    Sig: Take 100 mcg by mouth daily.  Marland Kitchen glimepiride (AMARYL) 1 MG tablet    Sig: Take 1 tablet (1 mg total) by mouth daily with breakfast.    Dispense:  30 tablet    Refill:  5     Penni Homans, MD

## 2016-04-27 NOTE — Assessment & Plan Note (Signed)
Continues to abstain 

## 2016-04-27 NOTE — Assessment & Plan Note (Signed)
Encouraged DASH diet, decrease po intake and increase exercise as tolerated. Needs 7-8 hours of sleep nightly. Avoid trans fats, eat small, frequent meals every 4-5 hours with lean proteins, complex carbs and healthy fats. Minimize simple carbs 

## 2016-04-27 NOTE — Assessment & Plan Note (Signed)
hgba1c unacceptable, minimize simple carbs. Increase exercise as tolerated. Continue current meds and add Glimeperide 1 mg daily. He acknowledges he has been eating badly. Will redouble his efforts and we will repeat labs in 3 months

## 2016-05-13 ENCOUNTER — Other Ambulatory Visit: Payer: Self-pay | Admitting: Family Medicine

## 2016-06-27 ENCOUNTER — Other Ambulatory Visit: Payer: Self-pay | Admitting: Family Medicine

## 2016-07-12 ENCOUNTER — Other Ambulatory Visit: Payer: Self-pay | Admitting: Family Medicine

## 2016-07-16 ENCOUNTER — Other Ambulatory Visit (INDEPENDENT_AMBULATORY_CARE_PROVIDER_SITE_OTHER): Payer: BC Managed Care – PPO

## 2016-07-16 DIAGNOSIS — E1169 Type 2 diabetes mellitus with other specified complication: Secondary | ICD-10-CM | POA: Diagnosis not present

## 2016-07-16 DIAGNOSIS — E669 Obesity, unspecified: Secondary | ICD-10-CM | POA: Diagnosis not present

## 2016-07-16 DIAGNOSIS — I10 Essential (primary) hypertension: Secondary | ICD-10-CM | POA: Diagnosis not present

## 2016-07-16 DIAGNOSIS — E782 Mixed hyperlipidemia: Secondary | ICD-10-CM

## 2016-07-16 DIAGNOSIS — Z0000 Encounter for general adult medical examination without abnormal findings: Secondary | ICD-10-CM

## 2016-07-16 LAB — COMPREHENSIVE METABOLIC PANEL
ALT: 42 U/L (ref 0–53)
AST: 19 U/L (ref 0–37)
Albumin: 4.7 g/dL (ref 3.5–5.2)
Alkaline Phosphatase: 60 U/L (ref 39–117)
BUN: 21 mg/dL (ref 6–23)
CHLORIDE: 96 meq/L (ref 96–112)
CO2: 32 mEq/L (ref 19–32)
Calcium: 10 mg/dL (ref 8.4–10.5)
Creatinine, Ser: 0.81 mg/dL (ref 0.40–1.50)
GFR: 103.62 mL/min (ref >60.00)
GLUCOSE: 162 mg/dL — AB (ref 70–99)
POTASSIUM: 4.3 meq/L (ref 3.5–5.1)
SODIUM: 137 meq/L (ref 135–145)
TOTAL PROTEIN: 7.6 g/dL (ref 6.0–8.3)
Total Bilirubin: 0.4 mg/dL (ref 0.2–1.2)

## 2016-07-16 LAB — CBC
HEMATOCRIT: 40.5 % (ref 39.0–52.0)
Hemoglobin: 13.7 g/dL (ref 13.0–17.0)
MCHC: 33.8 g/dL (ref 30.0–36.0)
MCV: 93.3 fl (ref 78.0–100.0)
Platelets: 343 10*3/uL (ref 150.0–400.0)
RBC: 4.34 Mil/uL (ref 4.22–5.81)
RDW: 13.8 % (ref 11.5–15.5)
WBC: 7.3 10*3/uL (ref 4.0–10.5)

## 2016-07-16 LAB — LIPID PANEL
Cholesterol: 142 mg/dL (ref 0–200)
HDL: 40.5 mg/dL (ref >39.00)
LDL CALC: 75 mg/dL (ref 0–99)
NONHDL: 101.48
Total CHOL/HDL Ratio: 4
Triglycerides: 134 mg/dL (ref 0.0–149.0)
VLDL: 26.8 mg/dL (ref 0.0–40.0)

## 2016-07-16 LAB — TSH: TSH: 1.74 u[IU]/mL (ref 0.35–4.50)

## 2016-07-16 LAB — HEMOGLOBIN A1C: HEMOGLOBIN A1C: 7 % — AB (ref 4.6–6.5)

## 2016-07-16 LAB — HIV ANTIBODY (ROUTINE TESTING W REFLEX): HIV: NONREACTIVE

## 2016-07-16 LAB — HEPATITIS C ANTIBODY: HCV Ab: NEGATIVE

## 2016-07-18 ENCOUNTER — Other Ambulatory Visit: Payer: Self-pay | Admitting: Family Medicine

## 2016-07-18 NOTE — Telephone Encounter (Signed)
Refill sent per [facility] refill protocol/SLS  

## 2016-07-24 ENCOUNTER — Ambulatory Visit: Payer: BC Managed Care – PPO | Admitting: Family Medicine

## 2016-08-01 ENCOUNTER — Encounter: Payer: Self-pay | Admitting: Family Medicine

## 2016-08-01 ENCOUNTER — Ambulatory Visit (INDEPENDENT_AMBULATORY_CARE_PROVIDER_SITE_OTHER): Payer: BC Managed Care – PPO | Admitting: Family Medicine

## 2016-08-01 VITALS — BP 130/78 | HR 91 | Temp 98.5°F | Wt 252.6 lb

## 2016-08-01 DIAGNOSIS — Z0000 Encounter for general adult medical examination without abnormal findings: Secondary | ICD-10-CM

## 2016-08-01 DIAGNOSIS — E291 Testicular hypofunction: Secondary | ICD-10-CM

## 2016-08-01 DIAGNOSIS — E6609 Other obesity due to excess calories: Secondary | ICD-10-CM | POA: Diagnosis not present

## 2016-08-01 DIAGNOSIS — E782 Mixed hyperlipidemia: Secondary | ICD-10-CM

## 2016-08-01 DIAGNOSIS — I10 Essential (primary) hypertension: Secondary | ICD-10-CM

## 2016-08-01 DIAGNOSIS — R7989 Other specified abnormal findings of blood chemistry: Secondary | ICD-10-CM

## 2016-08-01 DIAGNOSIS — E1169 Type 2 diabetes mellitus with other specified complication: Secondary | ICD-10-CM

## 2016-08-01 DIAGNOSIS — E669 Obesity, unspecified: Secondary | ICD-10-CM

## 2016-08-01 HISTORY — DX: Other specified abnormal findings of blood chemistry: R79.89

## 2016-08-01 MED ORDER — GLUCOSE BLOOD VI STRP: ORAL_STRIP | 6 refills | Status: DC

## 2016-08-01 MED ORDER — TESTOSTERONE 50 MG/5GM (1%) TD GEL: 50.0000 g | Freq: Every day | TRANSDERMAL | 5 refills | Status: DC

## 2016-08-01 MED ORDER — FREESTYLE LANCETS MISC: 6 refills | Status: DC

## 2016-08-01 NOTE — Assessment & Plan Note (Signed)
hgba1c acceptable, minimize simple carbs. Increase exercise as tolerated. Continue current meds 

## 2016-08-01 NOTE — Assessment & Plan Note (Signed)
Well controlled, no changes to meds. Encouraged heart healthy diet such as the DASH diet and exercise as tolerated.  °

## 2016-08-01 NOTE — Assessment & Plan Note (Signed)
Encouraged DASH diet, decrease po intake and increase exercise as tolerated. Needs 7-8 hours of sleep nightly. Avoid trans fats, eat small, frequent meals every 4-5 hours with lean proteins, complex carbs and healthy fats. Minimize simple carbs 

## 2016-08-01 NOTE — Assessment & Plan Note (Signed)
Check level with next visit. Start Androgel if insurance will cover. His insurance would not cover Axiron

## 2016-08-01 NOTE — Assessment & Plan Note (Signed)
Tolerating statin, encouraged heart healthy diet, avoid trans fats, minimize simple carbs and saturated fats. Increase exercise as tolerated 

## 2016-08-01 NOTE — Progress Notes (Signed)
Pre visit review using our clinic review tool, if applicable. No additional management support is needed unless otherwise documented below in the visit note. 

## 2016-08-01 NOTE — Progress Notes (Signed)
Patient ID: Phillip Doyle, male   DOB: Feb 24, 1956, 60 y.o.   MRN: [id number]   Subjective:    Patient ID: Phillip Doyle, male    DOB: 06/30/1956, 60 y.o.   MRN: [id number]  Chief Complaint  Patient presents with  . Follow-up    No energy x2 months.    HPI Patient is in today for follow up. He generally feels well but does endorse persistent fatigue over past one to 2 months. He reports he is going to bed by 8 pm each evening and still feels exhausted. No recent illness or hospitalizations. No polyuria or polydipsia. Denies CP/palp/SOB/HA/congestion/fevers/GI or GU c/o. Taking meds as prescribed  Past Medical History:  Diagnosis Date  . Alcohol abuse 11/28/2013  . ALLERGIC RHINITIS 02/15/2007  . Allergy    rhinitis  . Bronchitis 07/14/2012  . Diabetes mellitus   . DM 10/24/2010  . ED (erectile dysfunction) 02/17/2012  . EXOGENOUS OBESITY 02/15/2007  . H/O alcohol abuse 11/28/2013  . H/O tobacco use, presenting hazards to health 07/04/2010   Qualifier: Diagnosis of  By: [name] MD, Erline Levine  1/2 ppd   . HTN (hypertension) 08/20/2011  . Hypertension   . HYPERTENSION 02/15/2007  . Impaired glucose tolerance   . 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 Low testosterone level in male 08/01/2016  . LUMBAR STRAIN 10/17/2010  . Mixed hyperlipidemia 07/04/2010  . NISSEN FUNDOPLICATION, HX OF 123456  . OA (osteoarthritis) of knee 02/17/2012  . Obesity   . PEPTIC ULCER DISEASE 02/15/2007  . Personal history of colonic polyps 01/17/2013   colonoscoppy 2013, benign polyp advised repeat in 10 years  . Preventative health care 07/19/2012  . TOBACCO ABUSE 07/04/2010  . Ulcer ([place]) 1982   peptic ulcer disease    Past Surgical History:  Procedure Laterality Date  . COLONOSCOPY  03/2012   Several hyperplastic polyps  . fundiplication for [facility] and reflux  1987  . negative stress test  2008  . ROTATOR CUFF REPAIR  2000  . VASECTOMY  1982    Family History  Problem Relation Age of Onset  . Asthma Mother   .  Cancer Mother     uterine   . Alcohol abuse Father   . Stroke Maternal Grandmother     possibly  . Hypertension Maternal Grandfather   . Hypertension Brother   . Diabetes Brother   . Cancer Brother     prostate  . Colon cancer Neg Hx     Social History   Social History  . Marital status: Married    Spouse name: [name]  . Number of children: N/A  . 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 Smoking status: Former Smoker    Types: Cigarettes    Quit date: 06/28/2014  . Smokeless tobacco: Former Systems developer    Quit date: 03/18/2012     Comment: quit 06/18/10- started back   . Alcohol use 0.0 oz/week    3 - 5 Cans of beer per week     Comment: 3-5 beer daily  . Drug use: No  . Sexual activity: Yes    Partners: Female   Other Topics Concern  . Not on file   Social History Narrative  . No narrative on file    Outpatient Medications Prior to Visit  Medication Sig Dispense Refill  . glimepiride (AMARYL) 1 MG tablet Take 1 tablet (1 mg total) by mouth daily  with breakfast. 30 tablet 5  . lisinopril-hydrochlorothiazide (PRINZIDE,ZESTORETIC) 20-12.5 MG tablet Take 1 tablet by mouth 2 (two) times daily. 180 tablet 1  . metFORMIN (GLUCOPHAGE) 1000 MG tablet TAKE 1 TABLET BY MOUTH 2 TIMES DAILY WITH A MEAL. 180 tablet 2  . metoprolol (TOPROL-XL) 200 MG 24 hr tablet TAKE 1 TABLET (200 MG TOTAL) BY MOUTH DAILY. 90 tablet 1  . Multiple Vitamins-Minerals (PA MENS 50 PLUS VITAPAK PO) Take 1 capsule by mouth daily.    . simvastatin (ZOCOR) 10 MG tablet TAKE 1 TABLET BY MOUTH DAILY AT 6 PM. 90 tablet 1  . TRADJENTA 5 MG TABS tablet TAKE 1 TABLET BY MOUTH EVERY DAY 30 tablet 6  . vitamin B-12 (CYANOCOBALAMIN) 100 MCG tablet Take 100 mcg by mouth daily.    Marland Kitchen glucose blood test strip Check blood sugar twice daily. DX: E11.9 100 each 6  . Lancets (FREESTYLE) lancets Check blood sugar twice daily.  DX E11.9 100 each 6   No facility-administered  medications prior to visit.     No Known Allergies  Review of Systems  Constitutional: Positive for malaise/fatigue. Negative for fever.  HENT: Negative for congestion.   Eyes: Negative for blurred vision.  Respiratory: Negative for shortness of breath.   Cardiovascular: Negative for chest pain, palpitations and leg swelling.  Gastrointestinal: Negative for abdominal pain, blood in stool and nausea.  Genitourinary: Negative for dysuria and frequency.  Musculoskeletal: Negative for falls.  Skin: Negative for rash.  Neurological: Negative for dizziness, loss of consciousness and headaches.  Endo/Heme/Allergies: Negative for environmental allergies.  Psychiatric/Behavioral: Negative for depression. The patient is not nervous/anxious.        Objective:    Physical Exam  Constitutional: He is oriented to person, place, and time. He appears well-developed and well-nourished. No distress.  HENT:  Head: Normocephalic and atraumatic.  Nose: Nose normal.  Eyes: Right eye exhibits no discharge. Left eye exhibits no discharge.  Neck: Normal range of motion. Neck supple.  Cardiovascular: Normal rate and regular rhythm.   No murmur heard. Pulmonary/Chest: Effort normal and breath sounds normal.  Abdominal: Soft. Bowel sounds are normal. There is no tenderness.  Musculoskeletal: He exhibits no edema.  Neurological: He is alert and oriented to person, place, and time.  Skin: Skin is warm and dry.  Psychiatric: He has a normal mood and affect.  Nursing note and vitals reviewed.   BP 130/78 (BP Location: Left Arm, Patient Position: Sitting, Cuff Size: Large)   Pulse 91   Temp 98.5 F (36.9 C) (Oral)   Wt 252 lb 9.6 oz (114.6 kg)   SpO2 95% Comment: RA  BMI 37.30 kg/m  Wt Readings from Last 3 Encounters:  08/01/16 252 lb 9.6 oz (114.6 kg)  04/15/16 252 lb 2 oz (114.4 kg)  12/03/15 244 lb 12.8 oz (111 kg)     Lab Results  Component Value Date   WBC 7.3 07/16/2016   HGB 13.7  07/16/2016   HCT 40.5 07/16/2016   PLT 343.0 07/16/2016   GLUCOSE 162 (H) 07/16/2016   CHOL 142 07/16/2016   TRIG 134.0 07/16/2016   HDL 40.50 07/16/2016   LDLDIRECT 101.3 01/28/2008   LDLCALC 75 07/16/2016   ALT 42 07/16/2016   AST 19 07/16/2016   NA 137 07/16/2016   K 4.3 07/16/2016   CL 96 07/16/2016   CREATININE 0.81 07/16/2016   BUN 21 07/16/2016   CO2 32 07/16/2016   TSH 1.74 07/16/2016   PSA 0.34 11/23/2014  HGBA1C 7.0 (H) 07/16/2016   MICROALBUR 2.5 (H) 05/31/2015    Lab Results  Component Value Date   TSH 1.74 07/16/2016   Lab Results  Component Value Date   WBC 7.3 07/16/2016   HGB 13.7 07/16/2016   HCT 40.5 07/16/2016   MCV 93.3 07/16/2016   PLT 343.0 07/16/2016   Lab Results  Component Value Date   NA 137 07/16/2016   K 4.3 07/16/2016   CO2 32 07/16/2016   GLUCOSE 162 (H) 07/16/2016   BUN 21 07/16/2016   CREATININE 0.81 07/16/2016   BILITOT 0.4 07/16/2016   ALKPHOS 60 07/16/2016   AST 19 07/16/2016   ALT 42 07/16/2016   PROT 7.6 07/16/2016   ALBUMIN 4.7 07/16/2016   CALCIUM 10.0 07/16/2016   GFR 103.62 07/16/2016   Lab Results  Component Value Date   CHOL 142 07/16/2016   Lab Results  Component Value Date   HDL 40.50 07/16/2016   Lab Results  Component Value Date   LDLCALC 75 07/16/2016   Lab Results  Component Value Date   TRIG 134.0 07/16/2016   Lab Results  Component Value Date   CHOLHDL 4 07/16/2016   Lab Results  Component Value Date   HGBA1C 7.0 (H) 07/16/2016       Assessment & Plan:   Problem List Items Addressed This Visit    MIXED HYPERLIPIDEMIA    Tolerating statin, encouraged heart healthy diet, avoid trans fats, minimize simple carbs and saturated fats. Increase exercise as tolerated      Relevant Orders   Lipid panel   Lipid panel   Obesity    Encouraged DASH diet, decrease po intake and increase exercise as tolerated. Needs 7-8 hours of sleep nightly. Avoid trans fats, eat small, frequent meals every  4-5 hours with lean proteins, complex carbs and healthy fats. Minimize simple carbs      Diabetes mellitus type 2 in obese (HCC)    hgba1c acceptable, minimize simple carbs. Increase exercise as tolerated. Continue current meds      Relevant Orders   CBC   Comprehensive metabolic panel   Hemoglobin A1c   Hemoglobin A1c   Microalbumin / creatinine urine ratio   HTN (hypertension) - Primary    Well controlled, no changes to meds. Encouraged heart healthy diet such as the DASH diet and exercise as tolerated.       Relevant Orders   CBC   Comprehensive metabolic panel   TSH   Preventative health care   Relevant Orders   Hemoglobin A1c   CBC   Comprehensive metabolic panel   Lipid panel   TSH   Microalbumin / creatinine urine ratio   Low testosterone in male    Check level with next visit. Start Androgel if insurance will cover. His insurance would not cover Axiron      Relevant Orders   TSH   Testosterone      I am having Mr. Thole maintain his Multiple Vitamins-Minerals (PA MENS 50 PLUS VITAPAK PO), lisinopril-hydrochlorothiazide, vitamin B-12, glimepiride, TRADJENTA, metFORMIN, metoprolol, simvastatin, glucose blood, and freestyle.  Meds ordered this encounter  Medications  . glucose blood test strip    Sig: Check blood sugar twice daily. DX: E11.9    Dispense:  100 each    Refill:  6  . Lancets (FREESTYLE) lancets    Sig: Check blood sugar twice daily.  DX E11.9    Dispense:  100 each    Refill:  6  . DISCONTD: testosterone (  ANDROGEL) 50 MG/5GM (1%) GEL    Sig: Place 50 g onto the skin daily.    Dispense:  50 g    Refill:  5     Penni Homans, MD

## 2016-08-05 ENCOUNTER — Telehealth: Payer: Self-pay | Admitting: Family Medicine

## 2016-08-05 MED ORDER — TESTOSTERONE 20.25 MG/1.25GM (1.62%) TD GEL: TRANSDERMAL | 2 refills | Status: DC

## 2016-08-05 NOTE — Telephone Encounter (Signed)
Sent in to [facility] as requested.

## 2016-08-05 NOTE — Telephone Encounter (Signed)
Ok to switch to 1.62%, apply 40.5 mg q am to skin as directed

## 2016-08-05 NOTE — Telephone Encounter (Signed)
Caller name: Drue Dun Relation to pt: Pharmacist Call back number: [phone] Pharmacy: [facility]  Reason for call: Pharmacist called to inform that pt's insurance will not cover testosterone (ANDROGEL) 50 MG/5GM (1%) GEL for pt but will cover the androgel 1.62 % Gel. Pharmacist would like to know if rx can be changed to Androgel 1.62% and sent to [facility] pharmacy. Please advise.

## 2016-08-08 NOTE — Telephone Encounter (Signed)
Called pharmacy, spoke w/ Drue Dun.  Previous information received was wrong on all accounts. Androgel 1% is no longer on the market. Androgel 1.62% is not covered by insurance. Rx was not written for patches. Pharmacy provided number of [phone] to call for medical necessity for coverage.

## 2016-08-08 NOTE — Telephone Encounter (Signed)
Pharmacy needs a Rx for the Androgel 1.62% pump. States the Rx was written for the patches. Plse adv

## 2016-08-14 NOTE — Telephone Encounter (Signed)
error 

## 2016-08-14 NOTE — Telephone Encounter (Signed)
Injections have more risk for complications but if he is willing to try he would have to come in for first injections after picking up prescription at pharmacy

## 2016-08-14 NOTE — Telephone Encounter (Signed)
Called the pharmacist and she ran all testosterone products through and his insurance covers none of them.. She did suggest testosterone injections as they are cheap with insurance.  Advise or initiate PA.

## 2016-08-14 NOTE — Telephone Encounter (Signed)
Please call pharmacy and find out which topical testosterone they will cover patch vs Axiron etc. I will prescribe what they will allow

## 2016-08-15 NOTE — Telephone Encounter (Signed)
Informed the patients wife and they will discuss/call back. They would like a prescription for a BP cuff to take to a medical supply store please.

## 2016-08-15 NOTE — Telephone Encounter (Signed)
OK to give rx for Blood pressure cuff. Omron is the best OTC machine.

## 2016-08-15 NOTE — Telephone Encounter (Signed)
Patient informed and will pickup prescription at the front desk. Regarding testosterone injections he decided to scheduled a 15 minute appt. To discuss (09/12/2016 at 1:15pm)).

## 2016-08-19 NOTE — Patient Instructions (Signed)
Carbohydrate Counting for Diabetes Mellitus, Adult Carbohydrate counting is a method for keeping track of how many carbohydrates you eat. Eating carbohydrates naturally increases the amount of sugar (glucose) in the blood. Counting how many carbohydrates you eat helps keep your blood glucose within normal limits, which helps you manage your diabetes (diabetes mellitus). It is important to know how many carbohydrates you can safely have in each meal. This is different for every person. A diet and nutrition specialist (registered dietitian) can help you make a meal plan and calculate how many carbohydrates you should have at each meal and snack. Carbohydrates are found in the following foods:  Grains, such as breads and cereals.  Dried beans and soy products.  Starchy vegetables, such as potatoes, peas, and corn.  Fruit and fruit juices.  Milk and yogurt.  Sweets and snack foods, such as cake, cookies, candy, chips, and soft drinks. How do I count carbohydrates? There are two ways to count carbohydrates in food. You can use either of the methods or a combination of both. Reading "Nutrition Facts" on packaged food  The "Nutrition Facts" list is included on the labels of almost all packaged foods and beverages in the [place] It includes:  The serving size.  Information about nutrients in each serving, including the grams (g) of carbohydrate per serving. To use the "Nutrition Facts":  Decide how many servings you will have.  Multiply the number of servings by the number of carbohydrates per serving.  The resulting number is the total amount of carbohydrates that you will be having. Learning standard serving sizes of other foods  When you eat foods containing carbohydrates that are not packaged or do not include "Nutrition Facts" on the label, you need to measure the servings in order to count the amount of carbohydrates:  Measure the foods that you will eat with a food scale or measuring  cup, if needed.  Decide how many standard-size servings you will eat.  Multiply the number of servings by 15. Most carbohydrate-rich foods have about 15 g of carbohydrates per serving.  For example, if you eat 8 oz (170 g) of strawberries, you will have eaten 2 servings and 30 g of carbohydrates (2 servings x 15 g = 30 g).  For foods that have more than one food mixed, such as soups and casseroles, you must count the carbohydrates in each food that is included. The following list contains standard serving sizes of common carbohydrate-rich foods. Each of these servings has about 15 g of carbohydrates:   hamburger bun or  English muffin.   oz (15 mL) syrup.   oz (14 g) jelly.  1 slice of bread.  1 six-inch tortilla.  3 oz (85 g) cooked rice or pasta.  4 oz (113 g) cooked dried beans.  4 oz (113 g) starchy vegetable, such as peas, corn, or potatoes.  4 oz (113 g) hot cereal.  4 oz (113 g) mashed potatoes or  of a large baked potato.  4 oz (113 g) canned or frozen fruit.  4 oz (120 mL) fruit juice.  4-6 crackers.  6 chicken nuggets.  6 oz (170 g) unsweetened dry cereal.  6 oz (170 g) plain fat-free yogurt or yogurt sweetened with artificial sweeteners.  8 oz (240 mL) milk.  8 oz (170 g) fresh fruit or one small piece of fruit.  24 oz (680 g) popped popcorn. Example of carbohydrate counting Sample meal  3 oz (85 g) chicken breast.  6 oz (  170 g) brown rice.  4 oz (113 g) corn.  8 oz (240 mL) milk.  8 oz (170 g) strawberries with sugar-free whipped topping. Carbohydrate calculation 1. Identify the foods that contain carbohydrates:  Rice.  Corn.  Milk.  Strawberries. 2. Calculate how many servings you have of each food:  2 servings rice.  1 serving corn.  1 serving milk.  1 serving strawberries. 3. Multiply each number of servings by 15 g:  2 servings rice x 15 g = 30 g.  1 serving corn x 15 g = 15 g.  1 serving milk x 15 g = 15  g.  1 serving strawberries x 15 g = 15 g. 4. Add together all of the amounts to find the total grams of carbohydrates eaten:  30 g + 15 g + 15 g + 15 g = 75 g of carbohydrates total. This information is not intended to replace advice given to you by your health care provider. Make sure you discuss any questions you have with your health care provider. Document Released: 08/04/2005 Document Revised: 02/22/2016 Document Reviewed: 01/16/2016 Elsevier Interactive Patient Education  2017 Elsevier Inc.  

## 2016-09-12 ENCOUNTER — Telehealth: Payer: Self-pay

## 2016-09-12 ENCOUNTER — Ambulatory Visit (INDEPENDENT_AMBULATORY_CARE_PROVIDER_SITE_OTHER): Payer: BC Managed Care – PPO | Admitting: Family Medicine

## 2016-09-12 ENCOUNTER — Encounter: Payer: Self-pay | Admitting: Family Medicine

## 2016-09-12 VITALS — BP 142/70 | HR 84 | Temp 98.4°F | Wt 254.0 lb

## 2016-09-12 DIAGNOSIS — E669 Obesity, unspecified: Secondary | ICD-10-CM

## 2016-09-12 DIAGNOSIS — R7989 Other specified abnormal findings of blood chemistry: Secondary | ICD-10-CM

## 2016-09-12 DIAGNOSIS — E6609 Other obesity due to excess calories: Secondary | ICD-10-CM

## 2016-09-12 DIAGNOSIS — E782 Mixed hyperlipidemia: Secondary | ICD-10-CM | POA: Diagnosis not present

## 2016-09-12 DIAGNOSIS — I10 Essential (primary) hypertension: Secondary | ICD-10-CM | POA: Diagnosis not present

## 2016-09-12 DIAGNOSIS — E291 Testicular hypofunction: Secondary | ICD-10-CM

## 2016-09-12 DIAGNOSIS — Z87891 Personal history of nicotine dependence: Secondary | ICD-10-CM

## 2016-09-12 DIAGNOSIS — E1169 Type 2 diabetes mellitus with other specified complication: Secondary | ICD-10-CM | POA: Diagnosis not present

## 2016-09-12 MED ORDER — TESTOSTERONE 20.25 MG/1.25GM (1.62%) TD GEL: TRANSDERMAL | 2 refills | Status: DC

## 2016-09-12 NOTE — Progress Notes (Signed)
Subjective:    Patient ID: Phillip Doyle, male    DOB: August 13, 1956, 61 y.o.   MRN: [id number]  Chief Complaint  Patient presents with  . Follow-up   I acted as a Education administrator for Dr. [name]. [place], RMA   HPI Patient is in today for follow up for testosterone deficiency, diabetes mellitus, HTN, hyperlipidemia amongst others. He feels well today except for persistent fatigue. He is hoping now that he has new insurance they will cover his testosterone prescription. His previous insurance would not cover this. He denies polyuria or polydipsia. Denies CP/palp/SOB/HA/congestion/fevers/GI or GU c/o. Taking meds as prescribed  Past Medical History:  Diagnosis Date  . Alcohol abuse 11/28/2013  . ALLERGIC RHINITIS 02/15/2007  . Allergy    rhinitis  . Bronchitis 07/14/2012  . Diabetes mellitus   . DM 10/24/2010  . ED (erectile dysfunction) 02/17/2012  . EXOGENOUS OBESITY 02/15/2007  . H/O alcohol abuse 11/28/2013  . H/O tobacco use, presenting hazards to health 07/04/2010   Qualifier: Diagnosis of  By: [name] MD, Erline Levine  1/2 ppd   . HTN (hypertension) 08/20/2011  . Hypertension   . HYPERTENSION 02/15/2007  . Impaired glucose tolerance   . 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 Low testosterone level in male 08/01/2016  . LUMBAR STRAIN 10/17/2010  . Mixed hyperlipidemia 07/04/2010  . NISSEN FUNDOPLICATION, HX OF 123456  . OA (osteoarthritis) of knee 02/17/2012  . Obesity   . PEPTIC ULCER DISEASE 02/15/2007  . Personal history of colonic polyps 01/17/2013   colonoscoppy 2013, benign polyp advised repeat in 10 years  . Preventative health care 07/19/2012  . TOBACCO ABUSE 07/04/2010  . Ulcer ([place]) 1982   peptic ulcer disease    Past Surgical History:  Procedure Laterality Date  . COLONOSCOPY  03/2012   Several hyperplastic polyps  . fundiplication for [facility] and reflux  1987  . negative stress test  2008  . ROTATOR CUFF REPAIR  2000  . VASECTOMY  1982    Family History  Problem Relation Age of  Onset  . Asthma Mother   . Cancer Mother     uterine   . Alcohol abuse Father   . Stroke Maternal Grandmother     possibly  . Hypertension Maternal Grandfather   . Hypertension Brother   . Diabetes Brother   . Cancer Brother     prostate  . Colon cancer Neg Hx     Social History   Social History  . Marital status: Married    Spouse name: [name]  . Number of children: N/A  . 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 Smoking status: Former Smoker    Types: Cigarettes    Quit date: 06/28/2014  . Smokeless tobacco: Former Systems developer    Quit date: 03/18/2012     Comment: quit 06/18/10- started back   . Alcohol use 0.0 oz/week    3 - 5 Cans of beer per week     Comment: 3-5 beer daily  . Drug use: No  . Sexual activity: Yes    Partners: Female   Other Topics Concern  . Not on file   Social History Narrative  . No narrative on file    Outpatient Medications Prior to Visit  Medication Sig Dispense Refill  . glimepiride (AMARYL) 1 MG tablet Take 1 tablet (1 mg total) by mouth daily with breakfast. 30 tablet 5  . glucose blood  test strip Check blood sugar twice daily. DX: E11.9 100 each 6  . Lancets (FREESTYLE) lancets Check blood sugar twice daily.  DX E11.9 100 each 6  . lisinopril-hydrochlorothiazide (PRINZIDE,ZESTORETIC) 20-12.5 MG tablet Take 1 tablet by mouth 2 (two) times daily. 180 tablet 1  . metFORMIN (GLUCOPHAGE) 1000 MG tablet TAKE 1 TABLET BY MOUTH 2 TIMES DAILY WITH A MEAL. 180 tablet 2  . metoprolol (TOPROL-XL) 200 MG 24 hr tablet TAKE 1 TABLET (200 MG TOTAL) BY MOUTH DAILY. 90 tablet 1  . Multiple Vitamins-Minerals (PA MENS 50 PLUS VITAPAK PO) Take 1 capsule by mouth daily.    . simvastatin (ZOCOR) 10 MG tablet TAKE 1 TABLET BY MOUTH DAILY AT 6 PM. 90 tablet 1  . TRADJENTA 5 MG TABS tablet TAKE 1 TABLET BY MOUTH EVERY DAY 30 tablet 6  . vitamin B-12 (CYANOCOBALAMIN) 100 MCG tablet Take 100 mcg by mouth daily.    .  Testosterone (ANDROGEL) 20.25 MG/1.25GM (1.62%) GEL Apply 40.5 to skin every morning daily 1.25 g 2   No facility-administered medications prior to visit.     No Known Allergies  Review of Systems  Constitutional: Positive for malaise/fatigue. Negative for fever.  HENT: Negative for congestion.   Eyes: Negative for blurred vision.  Respiratory: Negative for cough and shortness of breath.   Cardiovascular: Negative for chest pain, palpitations and leg swelling.  Gastrointestinal: Negative for vomiting.  Genitourinary: Negative for frequency.  Musculoskeletal: Negative for back pain.  Skin: Negative for rash.  Neurological: Negative for loss of consciousness and headaches.       Objective:    Physical Exam  Constitutional: He is oriented to person, place, and time. He appears well-developed and well-nourished. No distress.  HENT:  Head: Normocephalic and atraumatic.  Eyes: Conjunctivae are normal.  Neck: Normal range of motion. No thyromegaly present.  Cardiovascular: Normal rate and regular rhythm.   Pulmonary/Chest: Effort normal and breath sounds normal. He has no wheezes.  Abdominal: Soft. Bowel sounds are normal. There is no tenderness.  Musculoskeletal: Normal range of motion. He exhibits no edema or deformity.  Neurological: He is alert and oriented to person, place, and time.  Skin: Skin is warm and dry. He is not diaphoretic.  Psychiatric: He has a normal mood and affect.    BP (!) 142/70   Pulse 84   Temp 98.4 F (36.9 C) (Oral)   Wt 254 lb (115.2 kg)   SpO2 94%   BMI 37.51 kg/m  Wt Readings from Last 3 Encounters:  09/12/16 254 lb (115.2 kg)  08/01/16 252 lb 9.6 oz (114.6 kg)  04/15/16 252 lb 2 oz (114.4 kg)     Lab Results  Component Value Date   WBC 7.3 07/16/2016   HGB 13.7 07/16/2016   HCT 40.5 07/16/2016   PLT 343.0 07/16/2016   GLUCOSE 162 (H) 07/16/2016   CHOL 142 07/16/2016   TRIG 134.0 07/16/2016   HDL 40.50 07/16/2016   LDLDIRECT 101.3  01/28/2008   LDLCALC 75 07/16/2016   ALT 42 07/16/2016   AST 19 07/16/2016   NA 137 07/16/2016   K 4.3 07/16/2016   CL 96 07/16/2016   CREATININE 0.81 07/16/2016   BUN 21 07/16/2016   CO2 32 07/16/2016   TSH 1.74 07/16/2016   PSA 0.34 11/23/2014   HGBA1C 7.0 (H) 07/16/2016   MICROALBUR 2.5 (H) 05/31/2015    Lab Results  Component Value Date   TSH 1.74 07/16/2016   Lab Results  Component Value  Date   WBC 7.3 07/16/2016   HGB 13.7 07/16/2016   HCT 40.5 07/16/2016   MCV 93.3 07/16/2016   PLT 343.0 07/16/2016   Lab Results  Component Value Date   NA 137 07/16/2016   K 4.3 07/16/2016   CO2 32 07/16/2016   GLUCOSE 162 (H) 07/16/2016   BUN 21 07/16/2016   CREATININE 0.81 07/16/2016   BILITOT 0.4 07/16/2016   ALKPHOS 60 07/16/2016   AST 19 07/16/2016   ALT 42 07/16/2016   PROT 7.6 07/16/2016   ALBUMIN 4.7 07/16/2016   CALCIUM 10.0 07/16/2016   GFR 103.62 07/16/2016   Lab Results  Component Value Date   CHOL 142 07/16/2016   Lab Results  Component Value Date   HDL 40.50 07/16/2016   Lab Results  Component Value Date   LDLCALC 75 07/16/2016   Lab Results  Component Value Date   TRIG 134.0 07/16/2016   Lab Results  Component Value Date   CHOLHDL 4 07/16/2016   Lab Results  Component Value Date   HGBA1C 7.0 (H) 07/16/2016       Assessment & Plan:   Problem List Items Addressed This Visit    MIXED HYPERLIPIDEMIA    Encouraged heart healthy diet, increase exercise, avoid trans fats, consider a krill oil cap daily      Obesity    Encouraged DASH diet, decrease po intake and increase exercise as tolerated. Needs 7-8 hours of sleep nightly. Avoid trans fats, eat small, frequent meals every 4-5 hours with lean proteins, complex carbs and healthy fats. Minimize simple carbs      H/O tobacco use, presenting hazards to health    Continues to abstain.      Diabetes mellitus type 2 in obese (HCC)    hgba1c acceptable, minimize simple carbs. Increase  exercise as tolerated. Continue current meds      HTN (hypertension)    Well controlled, no changes to meds. Encouraged heart healthy diet such as the DASH diet and exercise as tolerated.       Low testosterone in male - Primary    He has new insurance so he is hoping they will cover his prescription. New rx written for Androgel         I am having Mr. Vanduyn maintain his Multiple Vitamins-Minerals (PA MENS 50 PLUS VITAPAK PO), lisinopril-hydrochlorothiazide, vitamin B-12, glimepiride, TRADJENTA, metFORMIN, metoprolol, simvastatin, glucose blood, freestyle, and Testosterone.  Meds ordered this encounter  Medications  . Testosterone (ANDROGEL) 20.25 MG/1.25GM (1.62%) GEL    Sig: Apply 40.5 to skin every morning daily    Dispense:  1.25 g    Refill:  2    CMA served as scribe during this visit. History, Physical and Plan performed by medical provider. Documentation and orders reviewed and attested to.  Penni Homans, MD

## 2016-09-12 NOTE — Patient Instructions (Signed)
Carbohydrate Counting for Diabetes Mellitus, Adult Carbohydrate counting is a method for keeping track of how many carbohydrates you eat. Eating carbohydrates naturally increases the amount of sugar (glucose) in the blood. Counting how many carbohydrates you eat helps keep your blood glucose within normal limits, which helps you manage your diabetes (diabetes mellitus). It is important to know how many carbohydrates you can safely have in each meal. This is different for every person. A diet and nutrition specialist (registered dietitian) can help you make a meal plan and calculate how many carbohydrates you should have at each meal and snack. Carbohydrates are found in the following foods:  Grains, such as breads and cereals.  Dried beans and soy products.  Starchy vegetables, such as potatoes, peas, and corn.  Fruit and fruit juices.  Milk and yogurt.  Sweets and snack foods, such as cake, cookies, candy, chips, and soft drinks. How do I count carbohydrates? There are two ways to count carbohydrates in food. You can use either of the methods or a combination of both. Reading "Nutrition Facts" on packaged food  The "Nutrition Facts" list is included on the labels of almost all packaged foods and beverages in the [place] It includes:  The serving size.  Information about nutrients in each serving, including the grams (g) of carbohydrate per serving. To use the "Nutrition Facts":  Decide how many servings you will have.  Multiply the number of servings by the number of carbohydrates per serving.  The resulting number is the total amount of carbohydrates that you will be having. Learning standard serving sizes of other foods  When you eat foods containing carbohydrates that are not packaged or do not include "Nutrition Facts" on the label, you need to measure the servings in order to count the amount of carbohydrates:  Measure the foods that you will eat with a food scale or measuring  cup, if needed.  Decide how many standard-size servings you will eat.  Multiply the number of servings by 15. Most carbohydrate-rich foods have about 15 g of carbohydrates per serving.  For example, if you eat 8 oz (170 g) of strawberries, you will have eaten 2 servings and 30 g of carbohydrates (2 servings x 15 g = 30 g).  For foods that have more than one food mixed, such as soups and casseroles, you must count the carbohydrates in each food that is included. The following list contains standard serving sizes of common carbohydrate-rich foods. Each of these servings has about 15 g of carbohydrates:   hamburger bun or  English muffin.   oz (15 mL) syrup.   oz (14 g) jelly.  1 slice of bread.  1 six-inch tortilla.  3 oz (85 g) cooked rice or pasta.  4 oz (113 g) cooked dried beans.  4 oz (113 g) starchy vegetable, such as peas, corn, or potatoes.  4 oz (113 g) hot cereal.  4 oz (113 g) mashed potatoes or  of a large baked potato.  4 oz (113 g) canned or frozen fruit.  4 oz (120 mL) fruit juice.  4-6 crackers.  6 chicken nuggets.  6 oz (170 g) unsweetened dry cereal.  6 oz (170 g) plain fat-free yogurt or yogurt sweetened with artificial sweeteners.  8 oz (240 mL) milk.  8 oz (170 g) fresh fruit or one small piece of fruit.  24 oz (680 g) popped popcorn. Example of carbohydrate counting Sample meal  3 oz (85 g) chicken breast.  6 oz (  170 g) brown rice.  4 oz (113 g) corn.  8 oz (240 mL) milk.  8 oz (170 g) strawberries with sugar-free whipped topping. Carbohydrate calculation 1. Identify the foods that contain carbohydrates:  Rice.  Corn.  Milk.  Strawberries. 2. Calculate how many servings you have of each food:  2 servings rice.  1 serving corn.  1 serving milk.  1 serving strawberries. 3. Multiply each number of servings by 15 g:  2 servings rice x 15 g = 30 g.  1 serving corn x 15 g = 15 g.  1 serving milk x 15 g = 15  g.  1 serving strawberries x 15 g = 15 g. 4. Add together all of the amounts to find the total grams of carbohydrates eaten:  30 g + 15 g + 15 g + 15 g = 75 g of carbohydrates total. This information is not intended to replace advice given to you by your health care provider. Make sure you discuss any questions you have with your health care provider. Document Released: 08/04/2005 Document Revised: 02/22/2016 Document Reviewed: 01/16/2016 Elsevier Interactive Patient Education  2017 Elsevier Inc.  

## 2016-09-12 NOTE — Assessment & Plan Note (Signed)
Encouraged heart healthy diet, increase exercise, avoid trans fats, consider a krill oil cap daily 

## 2016-09-12 NOTE — Assessment & Plan Note (Signed)
hgba1c acceptable, minimize simple carbs. Increase exercise as tolerated. Continue current meds 

## 2016-09-12 NOTE — Progress Notes (Signed)
Pre visit review using our clinic review tool, if applicable. No additional management support is needed unless otherwise documented below in the visit note. 

## 2016-09-12 NOTE — Assessment & Plan Note (Signed)
Well controlled, no changes to meds. Encouraged heart healthy diet such as the DASH diet and exercise as tolerated.  °

## 2016-09-14 NOTE — Assessment & Plan Note (Signed)
Encouraged DASH diet, decrease po intake and increase exercise as tolerated. Needs 7-8 hours of sleep nightly. Avoid trans fats, eat small, frequent meals every 4-5 hours with lean proteins, complex carbs and healthy fats. Minimize simple carbs 

## 2016-09-14 NOTE — Assessment & Plan Note (Signed)
He has new insurance so he is hoping they will cover his prescription. New rx written for Androgel

## 2016-09-14 NOTE — Assessment & Plan Note (Signed)
Continues to abstain 

## 2016-09-16 NOTE — Telephone Encounter (Signed)
e

## 2016-10-12 ENCOUNTER — Other Ambulatory Visit: Payer: Self-pay | Admitting: Family Medicine

## 2016-10-23 ENCOUNTER — Other Ambulatory Visit (INDEPENDENT_AMBULATORY_CARE_PROVIDER_SITE_OTHER): Payer: BC Managed Care – PPO

## 2016-10-23 DIAGNOSIS — E291 Testicular hypofunction: Secondary | ICD-10-CM | POA: Diagnosis not present

## 2016-10-23 DIAGNOSIS — E669 Obesity, unspecified: Secondary | ICD-10-CM | POA: Diagnosis not present

## 2016-10-23 DIAGNOSIS — E1169 Type 2 diabetes mellitus with other specified complication: Secondary | ICD-10-CM

## 2016-10-23 DIAGNOSIS — E782 Mixed hyperlipidemia: Secondary | ICD-10-CM | POA: Diagnosis not present

## 2016-10-23 DIAGNOSIS — Z0000 Encounter for general adult medical examination without abnormal findings: Secondary | ICD-10-CM

## 2016-10-23 DIAGNOSIS — I10 Essential (primary) hypertension: Secondary | ICD-10-CM

## 2016-10-23 DIAGNOSIS — R7989 Other specified abnormal findings of blood chemistry: Secondary | ICD-10-CM

## 2016-10-23 LAB — COMPREHENSIVE METABOLIC PANEL
ALK PHOS: 63 U/L (ref 39–117)
ALT: 37 U/L (ref 0–53)
AST: 17 U/L (ref 0–37)
Albumin: 4.5 g/dL (ref 3.5–5.2)
BILIRUBIN TOTAL: 0.3 mg/dL (ref 0.2–1.2)
BUN: 15 mg/dL (ref 6–23)
CALCIUM: 10.3 mg/dL (ref 8.4–10.5)
CO2: 31 meq/L (ref 19–32)
CREATININE: 0.85 mg/dL (ref 0.40–1.50)
Chloride: 95 mEq/L — ABNORMAL LOW (ref 96–112)
GFR: 97.93 mL/min (ref >60.00)
GLUCOSE: 172 mg/dL — AB (ref 70–99)
Potassium: 4.3 mEq/L (ref 3.5–5.1)
Sodium: 134 mEq/L — ABNORMAL LOW (ref 135–145)
TOTAL PROTEIN: 7.5 g/dL (ref 6.0–8.3)

## 2016-10-23 LAB — CBC
HCT: 40.6 % (ref 39.0–52.0)
Hemoglobin: 13.6 g/dL (ref 13.0–17.0)
MCHC: 33.6 g/dL (ref 30.0–36.0)
MCV: 94.3 fl (ref 78.0–100.0)
PLATELETS: 371 10*3/uL (ref 150.0–400.0)
RBC: 4.31 Mil/uL (ref 4.22–5.81)
RDW: 13.2 % (ref 11.5–15.5)
WBC: 8 10*3/uL (ref 4.0–10.5)

## 2016-10-23 LAB — LIPID PANEL
Cholesterol: 113 mg/dL (ref 0–200)
HDL: 35.9 mg/dL — AB (ref >39.00)
LDL Cholesterol: 55 mg/dL (ref 0–99)
NONHDL: 76.84
Total CHOL/HDL Ratio: 3
Triglycerides: 110 mg/dL (ref 0.0–149.0)
VLDL: 22 mg/dL (ref 0.0–40.0)

## 2016-10-23 LAB — TSH: TSH: 1.07 u[IU]/mL (ref 0.35–4.50)

## 2016-10-23 LAB — HEMOGLOBIN A1C: HEMOGLOBIN A1C: 7.2 % — AB (ref 4.6–6.5)

## 2016-10-23 LAB — TESTOSTERONE: TESTOSTERONE: 148.17 ng/dL — AB (ref 300.00–890.00)

## 2016-10-30 ENCOUNTER — Ambulatory Visit (INDEPENDENT_AMBULATORY_CARE_PROVIDER_SITE_OTHER): Payer: BC Managed Care – PPO | Admitting: Family Medicine

## 2016-10-30 ENCOUNTER — Encounter (INDEPENDENT_AMBULATORY_CARE_PROVIDER_SITE_OTHER): Payer: Self-pay

## 2016-10-30 ENCOUNTER — Encounter: Payer: Self-pay | Admitting: Family Medicine

## 2016-10-30 ENCOUNTER — Telehealth: Payer: Self-pay

## 2016-10-30 VITALS — BP 155/77 | HR 89 | Temp 98.7°F | Resp 18 | Wt 257.4 lb

## 2016-10-30 DIAGNOSIS — E291 Testicular hypofunction: Secondary | ICD-10-CM

## 2016-10-30 DIAGNOSIS — I10 Essential (primary) hypertension: Secondary | ICD-10-CM

## 2016-10-30 DIAGNOSIS — G479 Sleep disorder, unspecified: Secondary | ICD-10-CM

## 2016-10-30 DIAGNOSIS — E669 Obesity, unspecified: Secondary | ICD-10-CM

## 2016-10-30 DIAGNOSIS — R7989 Other specified abnormal findings of blood chemistry: Secondary | ICD-10-CM

## 2016-10-30 DIAGNOSIS — E1169 Type 2 diabetes mellitus with other specified complication: Secondary | ICD-10-CM

## 2016-10-30 HISTORY — DX: Sleep disorder, unspecified: G47.9

## 2016-10-30 MED ORDER — TESTOSTERONE CYPIONATE 100 MG/ML IM SOLN: 50.0000 mg | INTRAMUSCULAR | 0 refills | Status: DC

## 2016-10-30 MED ORDER — METFORMIN HCL 1000 MG PO TABS: ORAL_TABLET | ORAL | 2 refills | Status: DC

## 2016-10-30 NOTE — Assessment & Plan Note (Signed)
Referred to pulmonology for worsening symptoms including fatigue, snoring weight gain, worsening blood sugars and HTN

## 2016-10-30 NOTE — Assessment & Plan Note (Signed)
Continues to abstain 

## 2016-10-30 NOTE — Telephone Encounter (Signed)
Received PA approval. PA approved through 10/31/2019. Notice of approval sent for scanning.

## 2016-10-30 NOTE — Assessment & Plan Note (Signed)
Encouraged DASH diet, decrease po intake and increase exercise as tolerated. Needs 7-8 hours of sleep nightly. Avoid trans fats, eat small, frequent meals every 4-5 hours with lean proteins, complex carbs and healthy fats. Minimize simple carbs, given bariatric referral info

## 2016-10-30 NOTE — Progress Notes (Signed)
Subjective:  I acted as a Education administrator for Dr. [name]. [place], [place]   Patient ID: Phillip Doyle, male    DOB: [birth year], 61 y.o.   MRN: [id number]  Chief Complaint  Patient presents with  . Follow-up  . Hypertension  . Diabetes    Hypertension  Associated symptoms include malaise/fatigue. Pertinent negatives include no blurred vision, chest pain, headaches, palpitations or shortness of breath.  Diabetes  Pertinent negatives for hypoglycemia include no headaches. Pertinent negatives for diabetes include no blurred vision and no chest pain.    Patient is in today for a 3 month follow up following up on hypertension, diabetes type 2 and other medical conditions. He denies polyuria or polydipsia. He is continuing to abstain from cigarettes and alcohol. Is noting sugars have trended up. Sugars are running between 150 to 200. Denies CP/palp/SOB/HA/congestion/fevers/GI or GU c/o. Taking meds as prescribed  Patient Care Team: Mosie Lukes, MD as PCP - General (Family Medicine)   Past Medical History:  Diagnosis Date  . Alcohol abuse 11/28/2013  . ALLERGIC RHINITIS 02/15/2007  . Allergy    rhinitis  . Bronchitis 07/14/2012  . Diabetes mellitus   . DM 10/24/2010  . ED (erectile dysfunction) 02/17/2012  . EXOGENOUS OBESITY 02/15/2007  . H/O alcohol abuse 11/28/2013  . H/O tobacco use, presenting hazards to health 07/04/2010   Qualifier: Diagnosis of  By: [name] MD, Erline Levine  1/2 ppd   . HTN (hypertension) 08/20/2011  . Hypertension   . HYPERTENSION 02/15/2007  . Impaired glucose tolerance   . 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 Low testosterone level in male 08/01/2016  . LUMBAR STRAIN 10/17/2010  . Mixed hyperlipidemia 07/04/2010  . NISSEN FUNDOPLICATION, HX OF 01/02/16  . OA (osteoarthritis) of knee 02/17/2012  . Obesity   . PEPTIC ULCER DISEASE 02/15/2007  . Personal history of colonic polyps 01/17/2013   colonoscoppy 2013, benign polyp advised repeat in 10 years  . Preventative health care  07/19/2012  . Restless sleeper 10/30/2016  . TOBACCO ABUSE 07/04/2010  . Ulcer ([place]) 1982   peptic ulcer disease    Past Surgical History:  Procedure Laterality Date  . COLONOSCOPY  03/2012   Several hyperplastic polyps  . fundiplication for [facility] and reflux  1987  . negative stress test  2008  . ROTATOR CUFF REPAIR  2000  . VASECTOMY  1982    Family History  Problem Relation Age of Onset  . Asthma Mother   . Cancer Mother     uterine   . Alcohol abuse Father   . Stroke Maternal Grandmother     possibly  . Hypertension Maternal Grandfather   . Hypertension Brother   . Diabetes Brother   . Cancer Brother     prostate  . Colon cancer Neg Hx     Social History   Social History  . Marital status: Married    Spouse name: [name]  . Number of children: N/A  . 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 Smoking status: Former Smoker    Types: Cigarettes    Quit date: 06/28/2014  . Smokeless tobacco: Former Systems developer    Quit date: 03/18/2012     Comment: quit 06/18/10- started back   . Alcohol use 0.0 oz/week    3 - 5 Cans of beer per week     Comment: 3-5 beer daily  . Drug use: No  . Sexual  activity: Yes    Partners: Female   Other Topics Concern  . Not on file   Social History Narrative  . No narrative on file    Outpatient Medications Prior to Visit  Medication Sig Dispense Refill  . glimepiride (AMARYL) 1 MG tablet TAKE 1 TABLET BY MOUTH EVERY DAY WITH BREAKFAST 30 tablet 5  . glucose blood test strip Check blood sugar twice daily. DX: E11.9 100 each 6  . Lancets (FREESTYLE) lancets Check blood sugar twice daily.  DX E11.9 100 each 6  . lisinopril-hydrochlorothiazide (PRINZIDE,ZESTORETIC) 20-12.5 MG tablet Take 1 tablet by mouth 2 (two) times daily. 180 tablet 1  . metoprolol (TOPROL-XL) 200 MG 24 hr tablet TAKE 1 TABLET (200 MG TOTAL) BY MOUTH DAILY. 90 tablet 1  . Multiple Vitamins-Minerals (PA MENS 50 PLUS VITAPAK  PO) Take 1 capsule by mouth daily.    . simvastatin (ZOCOR) 10 MG tablet TAKE 1 TABLET BY MOUTH DAILY AT 6 PM. 90 tablet 1  . TRADJENTA 5 MG TABS tablet TAKE 1 TABLET BY MOUTH EVERY DAY 30 tablet 6  . vitamin B-12 (CYANOCOBALAMIN) 100 MCG tablet Take 100 mcg by mouth daily.    . metFORMIN (GLUCOPHAGE) 1000 MG tablet TAKE 1 TABLET BY MOUTH 2 TIMES DAILY WITH A MEAL. 180 tablet 2  . Testosterone (ANDROGEL) 20.25 MG/1.25GM (1.62%) GEL Apply 40.5 to skin every morning daily 1.25 g 2   No facility-administered medications prior to visit.     No Known Allergies  Review of Systems  Constitutional: Positive for malaise/fatigue. Negative for fever.  HENT: Negative for congestion.   Eyes: Negative for blurred vision.  Respiratory: Negative for cough and shortness of breath.   Cardiovascular: Negative for chest pain, palpitations and leg swelling.  Gastrointestinal: Negative for vomiting.  Musculoskeletal: Negative for back pain.  Skin: Negative for rash.  Neurological: Negative for loss of consciousness and headaches.       Objective:    Physical Exam  Constitutional: He is oriented to person, place, and time. He appears well-developed and well-nourished. No distress.  HENT:  Head: Normocephalic and atraumatic.  Eyes: Conjunctivae are normal.  Neck: Normal range of motion. No thyromegaly present.  Cardiovascular: Normal rate and regular rhythm.   Pulmonary/Chest: Effort normal and breath sounds normal. He has no wheezes.  Abdominal: Soft. Bowel sounds are normal. There is no tenderness.  Musculoskeletal: Normal range of motion. He exhibits no edema or deformity.  Neurological: He is alert and oriented to person, place, and time.  Skin: Skin is warm and dry. He is not diaphoretic.  Psychiatric: He has a normal mood and affect.    BP (!) 155/77 (BP Location: Left Arm, Patient Position: Sitting, Cuff Size: Large)   Pulse 89   Temp 98.7 F (37.1 C) (Oral)   Resp 18   Wt 257 lb 6.4 oz  (116.8 kg)   SpO2 98%   BMI 38.01 kg/m  Wt Readings from Last 3 Encounters:  10/30/16 257 lb 6.4 oz (116.8 kg)  09/12/16 254 lb (115.2 kg)  08/01/16 252 lb 9.6 oz (114.6 kg)      Immunization History  Administered Date(s) Administered  . Influenza Split 05/19/2011, 07/14/2012  . Influenza Whole 10/17/2010  . Influenza,inj,Quad PF,36+ Mos 05/24/2013, 05/29/2014, 05/31/2015, 04/15/2016  . Pneumococcal Conjugate-13 05/24/2013  . Pneumococcal Polysaccharide-23 05/31/2015  . Td 10/17/2010    Health Maintenance  Topic Date Due  . OPHTHALMOLOGY EXAM  04/18/2017  . HEMOGLOBIN A1C  04/25/2017  . FOOT  EXAM  10/30/2017  . PNEUMOCOCCAL POLYSACCHARIDE VACCINE (2) 05/30/2020  . TETANUS/TDAP  10/16/2020  . COLONOSCOPY  04/12/2022  . INFLUENZA VACCINE  Completed  . Hepatitis C Screening  Completed  . HIV Screening  Completed    Lab Results  Component Value Date   WBC 8.0 10/23/2016   HGB 13.6 10/23/2016   HCT 40.6 10/23/2016   PLT 371.0 10/23/2016   GLUCOSE 172 (H) 10/23/2016   CHOL 113 10/23/2016   TRIG 110.0 10/23/2016   HDL 35.90 (L) 10/23/2016   LDLDIRECT 101.3 01/28/2008   LDLCALC 55 10/23/2016   ALT 37 10/23/2016   AST 17 10/23/2016   NA 134 (L) 10/23/2016   K 4.3 10/23/2016   CL 95 (L) 10/23/2016   CREATININE 0.85 10/23/2016   BUN 15 10/23/2016   CO2 31 10/23/2016   TSH 1.07 10/23/2016   PSA 0.34 11/23/2014   HGBA1C 7.2 (H) 10/23/2016   MICROALBUR 2.5 (H) 05/31/2015    Lab Results  Component Value Date   TSH 1.07 10/23/2016   Lab Results  Component Value Date   WBC 8.0 10/23/2016   HGB 13.6 10/23/2016   HCT 40.6 10/23/2016   MCV 94.3 10/23/2016   PLT 371.0 10/23/2016   Lab Results  Component Value Date   NA 134 (L) 10/23/2016   K 4.3 10/23/2016   CO2 31 10/23/2016   GLUCOSE 172 (H) 10/23/2016   BUN 15 10/23/2016   CREATININE 0.85 10/23/2016   BILITOT 0.3 10/23/2016   ALKPHOS 63 10/23/2016   AST 17 10/23/2016   ALT 37 10/23/2016   PROT 7.5  10/23/2016   ALBUMIN 4.5 10/23/2016   CALCIUM 10.3 10/23/2016   GFR 97.93 10/23/2016   Lab Results  Component Value Date   CHOL 113 10/23/2016   Lab Results  Component Value Date   HDL 35.90 (L) 10/23/2016   Lab Results  Component Value Date   LDLCALC 55 10/23/2016   Lab Results  Component Value Date   TRIG 110.0 10/23/2016   Lab Results  Component Value Date   CHOLHDL 3 10/23/2016   Lab Results  Component Value Date   HGBA1C 7.2 (H) 10/23/2016         Assessment & Plan:   Problem List Items Addressed This Visit    Diabetes mellitus type 2 in obese ([place])    hgba1c acceptable, minimize simple carbs. Increase exercise as tolerated. Continue current meds      Relevant Medications   metFORMIN (GLUCOPHAGE) 1000 MG tablet   HTN (hypertension) - Primary    Well controlled, no changes to meds. Encouraged heart healthy diet such as the DASH diet and exercise as tolerated.       Low testosterone in male    Has not been supplementing due to insurance difficulties. Will try to prescribe again.       Restless sleeper    Referred to pulmonology for worsening symptoms including fatigue, snoring weight gain, worsening blood sugars and HTN      Relevant Orders   Ambulatory referral to Pulmonology      I have discontinued Mr. Arman's Testosterone. I have also changed his metFORMIN. Additionally, I am having him start on testosterone cypionate. Lastly, I am having him maintain his Multiple Vitamins-Minerals (PA MENS 50 PLUS VITAPAK PO), lisinopril-hydrochlorothiazide, vitamin B-12, TRADJENTA, metoprolol, simvastatin, glucose blood, freestyle, and glimepiride.  Meds ordered this encounter  Medications  . metFORMIN (GLUCOPHAGE) 1000 MG tablet    Sig: One tablet 2 times daily and  half a tablet everyday at [place]:  180 tablet    Refill:  2  . testosterone cypionate (DEPO-TESTOSTERONE) 100 MG/ML injection    Sig: Inject 0.5 mLs (50 mg total) into the muscle every  14 (fourteen) days. For IM use only    Dispense:  10 mL    Refill:  0    CMA served as Education administrator during this visit. History, Physical and Plan performed by medical provider. Documentation and orders reviewed and attested to.  Penni Homans, MD

## 2016-10-30 NOTE — Telephone Encounter (Signed)
PA initiated via Covermymeds; KEY: [id number]. Awaiting determination.

## 2016-10-30 NOTE — Progress Notes (Signed)
Pre visit review using our clinic review tool, if applicable. No additional management support is needed unless otherwise documented below in the visit note. 

## 2016-10-30 NOTE — Patient Instructions (Signed)
Diabetes Mellitus and Food It is important for you to manage your blood sugar (glucose) level. Your blood glucose level can be greatly affected by what you eat. Eating healthier foods in the appropriate amounts throughout the day at about the same time each day will help you control your blood glucose level. It can also help slow or prevent worsening of your diabetes mellitus. Healthy eating may even help you improve the level of your blood pressure and reach or maintain a healthy weight. General recommendations for healthful eating and cooking habits include:  Eating meals and snacks regularly. Avoid going long periods of time without eating to lose weight.  Eating a diet that consists mainly of plant-based foods, such as fruits, vegetables, nuts, legumes, and whole grains.  Using low-heat cooking methods, such as baking, instead of high-heat cooking methods, such as deep frying.  Work with your dietitian to make sure you understand how to use the Nutrition Facts information on food labels. How can food affect me? Carbohydrates Carbohydrates affect your blood glucose level more than any other type of food. Your dietitian will help you determine how many carbohydrates to eat at each meal and teach you how to count carbohydrates. Counting carbohydrates is important to keep your blood glucose at a healthy level, especially if you are using insulin or taking certain medicines for diabetes mellitus. Alcohol Alcohol can cause sudden decreases in blood glucose (hypoglycemia), especially if you use insulin or take certain medicines for diabetes mellitus. Hypoglycemia can be a life-threatening condition. Symptoms of hypoglycemia (sleepiness, dizziness, and disorientation) are similar to symptoms of having too much alcohol. If your health care provider has given you approval to drink alcohol, do so in moderation and use the following guidelines:  Women should not have more than one drink per day, and men  should not have more than two drinks per day. One drink is equal to: ? 12 oz of beer. ? 5 oz of wine. ? 1 oz of hard liquor.  Do not drink on an empty stomach.  Keep yourself hydrated. Have water, diet soda, or unsweetened iced tea.  Regular soda, juice, and other mixers might contain a lot of carbohydrates and should be counted.  What foods are not recommended? As you make food choices, it is important to remember that all foods are not the same. Some foods have fewer nutrients per serving than other foods, even though they might have the same number of calories or carbohydrates. It is difficult to get your body what it needs when you eat foods with fewer nutrients. Examples of foods that you should avoid that are high in calories and carbohydrates but low in nutrients include:  Trans fats (most processed foods list trans fats on the Nutrition Facts label).  Regular soda.  Juice.  Candy.  Sweets, such as cake, pie, doughnuts, and cookies.  Fried foods.  What foods can I eat? Eat nutrient-rich foods, which will nourish your body and keep you healthy. The food you should eat also will depend on several factors, including:  The calories you need.  The medicines you take.  Your weight.  Your blood glucose level.  Your blood pressure level.  Your cholesterol level.  You should eat a variety of foods, including:  Protein. ? Lean cuts of meat. ? Proteins low in saturated fats, such as fish, egg whites, and beans. Avoid processed meats.  Fruits and vegetables. ? Fruits and vegetables that may help control blood glucose levels, such as apples,   mangoes, and yams.  Dairy products. ? Choose fat-free or low-fat dairy products, such as milk, yogurt, and cheese.  Grains, bread, pasta, and rice. ? Choose whole grain products, such as multigrain bread, whole oats, and brown rice. These foods may help control blood pressure.  Fats. ? Foods containing healthful fats, such as  nuts, avocado, olive oil, canola oil, and fish.  Does everyone with diabetes mellitus have the same meal plan? Because every person with diabetes mellitus is different, there is not one meal plan that works for everyone. It is very important that you meet with a dietitian who will help you create a meal plan that is just right for you. This information is not intended to replace advice given to you by your health care provider. Make sure you discuss any questions you have with your health care provider. Document Released: 05/01/2005 Document Revised: 01/10/2016 Document Reviewed: 07/01/2013 Elsevier Interactive Patient Education  2017 Elsevier Inc.  

## 2016-11-02 NOTE — Assessment & Plan Note (Signed)
Well controlled, no changes to meds. Encouraged heart healthy diet such as the DASH diet and exercise as tolerated.  °

## 2016-11-02 NOTE — Assessment & Plan Note (Signed)
Has not been supplementing due to insurance difficulties. Will try to prescribe again.

## 2016-11-02 NOTE — Assessment & Plan Note (Signed)
hgba1c acceptable, minimize simple carbs. Increase exercise as tolerated. Continue current meds 

## 2016-11-12 ENCOUNTER — Ambulatory Visit (INDEPENDENT_AMBULATORY_CARE_PROVIDER_SITE_OTHER): Payer: BC Managed Care – PPO

## 2016-11-12 ENCOUNTER — Telehealth: Payer: Self-pay

## 2016-11-12 DIAGNOSIS — E349 Endocrine disorder, unspecified: Secondary | ICD-10-CM | POA: Diagnosis not present

## 2016-11-12 DIAGNOSIS — R7989 Other specified abnormal findings of blood chemistry: Secondary | ICD-10-CM

## 2016-11-12 MED ORDER — TESTOSTERONE CYPIONATE 200 MG/ML IM SOLN: 100.0000 mg | Freq: Once | INTRAMUSCULAR | 0 refills | Status: DC

## 2016-11-12 MED ORDER — TESTOSTERONE CYPIONATE 200 MG/ML IM SOLN
200.0000 mg | Freq: Once | INTRAMUSCULAR | Status: AC
  Administered 2016-11-12: 200 mg via INTRAMUSCULAR

## 2016-11-12 MED ORDER — TESTOSTERONE CYPIONATE 100 MG/ML IM SOLN: 50.0000 mg | INTRAMUSCULAR | 0 refills | Status: DC

## 2016-11-12 NOTE — Progress Notes (Signed)
Pre visit review using our clinic tool,if applicable. No additional management support is needed unless otherwise documented below in the visit note.   Patient in for Testosterone injection per order from Dr. Frederik Pear due to low Testosterone levels dated 10/31/2006.  Given Testosterone 0.40ml IM left hip. Patient tolerated well.    Patient states he is supposed to receive instructions today on how to self administer his Testosterone injections.   Patient given instructions and return demonstration given by patient. Advised patient that I would still need to schedule appointment for his next injection until I spoke with Dr. [name] regarding this. Patient agreed. Appointment scheduled and given to patient.

## 2016-11-12 NOTE — Telephone Encounter (Signed)
Patient came in today for Testosterone injection. Given 0.41ml. IM right gluteus. Patient states that he was told he could self administer his medication. Unable to find documentation in sytem stating this. Had patient give return demonstration by mouth. Appointment scheduled for next injection. WIll discuss with Dr. [name] and inform patient.

## 2016-11-12 NOTE — Telephone Encounter (Signed)
I am wiling to let him self inject but we would like to have him keep the next appt and if his wife is going to be giving the injections she should come to next visit to lear to give the injections.

## 2016-11-13 ENCOUNTER — Encounter: Payer: Self-pay | Admitting: Medical

## 2016-11-13 ENCOUNTER — Institutional Professional Consult (permissible substitution): Payer: BC Managed Care – PPO | Admitting: Internal Medicine

## 2016-11-13 ENCOUNTER — Ambulatory Visit (INDEPENDENT_AMBULATORY_CARE_PROVIDER_SITE_OTHER): Payer: BC Managed Care – PPO | Admitting: Medical

## 2016-11-13 VITALS — BP 137/72 | HR 91 | Temp 98.1°F | Resp 16 | Ht 69.0 in | Wt 256.2 lb

## 2016-11-13 DIAGNOSIS — R5383 Other fatigue: Secondary | ICD-10-CM

## 2016-11-13 DIAGNOSIS — L089 Local infection of the skin and subcutaneous tissue, unspecified: Secondary | ICD-10-CM | POA: Diagnosis not present

## 2016-11-13 LAB — CBC WITH DIFFERENTIAL/PLATELET
BASOS PCT: 0.7 % (ref 0.0–3.0)
Basophils Absolute: 0.1 10*3/uL (ref 0.0–0.1)
EOS PCT: 1.4 % (ref 0.0–5.0)
Eosinophils Absolute: 0.2 10*3/uL (ref 0.0–0.7)
HEMATOCRIT: 39.3 % (ref 39.0–52.0)
HEMOGLOBIN: 13.7 g/dL (ref 13.0–17.0)
LYMPHS PCT: 16.8 % (ref 12.0–46.0)
Lymphs Abs: 1.9 10*3/uL (ref 0.7–4.0)
MCHC: 34.8 g/dL (ref 30.0–36.0)
MCV: 91.5 fl (ref 78.0–100.0)
MONOS PCT: 8.6 % (ref 3.0–12.0)
Monocytes Absolute: 1 10*3/uL (ref 0.1–1.0)
NEUTROS ABS: 8.1 10*3/uL — AB (ref 1.4–7.7)
Neutrophils Relative %: 72.5 % (ref 43.0–77.0)
Platelets: 399 10*3/uL (ref 150.0–400.0)
RBC: 4.29 Mil/uL (ref 4.22–5.81)
RDW: 12.9 % (ref 11.5–15.5)
WBC: 11.2 10*3/uL — AB (ref 4.0–10.5)

## 2016-11-13 MED ORDER — METHYLPREDNISOLONE ACETATE 40 MG/ML IJ SUSP
40.0000 mg | Freq: Once | INTRAMUSCULAR | Status: AC
  Administered 2016-11-13: 40 mg via INTRAMUSCULAR

## 2016-11-13 MED ORDER — CLINDAMYCIN HCL 300 MG PO CAPS
300.0000 mg | ORAL_CAPSULE | Freq: Three times a day (TID) | ORAL | 0 refills | Status: DC
Start: 2016-11-13 — End: 2017-08-06

## 2016-11-13 MED ORDER — CEFTRIAXONE SODIUM 1 G IJ SOLR
1.0000 g | Freq: Once | INTRAMUSCULAR | Status: AC
  Administered 2016-11-13: 1 g via INTRAMUSCULAR

## 2016-11-13 NOTE — Progress Notes (Addendum)
Subjective:    Patient ID: Phillip Doyle, male    DOB: 02-22-1956, 61 y.o.   MRN: [id number]  HPI  Pt rt side face below eye some swelling. Yesterday felt fine. Noticed this in am. Mild nose bleed 2 days ago for 5 minutes on rt side but no recoccurence. Then stopped. Faint nasal congestion recently. No teeth pain.   No fever, no chills or sweats.  Pt sugar 120 this am.  No hx of skin infections per pt. Early today it feels little different. Pt feel tired today.   Review of Systems  Constitutional: Negative for chills and fatigue.  HENT: Positive for facial swelling. Negative for congestion, sinus pain, sinus pressure, sore throat and trouble swallowing.        Rtside cheek.  Respiratory: Negative for cough, chest tightness, shortness of breath and wheezing.   Cardiovascular: Negative for chest pain and palpitations.  Gastrointestinal: Negative for abdominal pain.  Musculoskeletal: Negative for back pain.  Skin:       See hpi. Skin infection.  Neurological: Negative for dizziness and headaches.  Hematological: Negative for adenopathy. Does not bruise/bleed easily.  Psychiatric/Behavioral: Negative for behavioral problems and confusion.    Past Medical History:  Diagnosis Date  . Alcohol abuse 11/28/2013  . ALLERGIC RHINITIS 02/15/2007  . Allergy    rhinitis  . Bronchitis 07/14/2012  . Diabetes mellitus   . DM 10/24/2010  . ED (erectile dysfunction) 02/17/2012  . EXOGENOUS OBESITY 02/15/2007  . H/O alcohol abuse 11/28/2013  . H/O tobacco use, presenting hazards to health 07/04/2010   Qualifier: Diagnosis of  By: [name] MD, Erline Levine  1/2 ppd   . HTN (hypertension) 08/20/2011  . Hypertension   . HYPERTENSION 02/15/2007  . Impaired glucose tolerance   . 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 Low testosterone level in male 08/01/2016  . LUMBAR STRAIN 10/17/2010  . Mixed hyperlipidemia 07/04/2010  . NISSEN FUNDOPLICATION, HX OF 02/14/5283  . OA (osteoarthritis) of knee 02/17/2012  .  Obesity   . PEPTIC ULCER DISEASE 02/15/2007  . Personal history of colonic polyps 01/17/2013   colonoscoppy 2013, benign polyp advised repeat in 10 years  . Preventative health care 07/19/2012  . Restless sleeper 10/30/2016  . TOBACCO ABUSE 07/04/2010  . Ulcer ([place]) 1982   peptic ulcer disease     Social History   Social History  . Marital status: Married    Spouse name: [name]  . Number of children: N/A  . 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 Smoking status: Former Smoker    Types: Cigarettes    Quit date: 06/28/2014  . Smokeless tobacco: Former Systems developer    Quit date: 03/18/2012     Comment: quit 06/18/10- started back   . Alcohol use 0.0 oz/week    3 - 5 Cans of beer per week     Comment: 3-5 beer daily  . Drug use: No  . Sexual activity: Yes    Partners: Female   Other Topics Concern  . Not on file   Social History Narrative  . No narrative on file    Past Surgical History:  Procedure Laterality Date  . COLONOSCOPY  03/2012   Several hyperplastic polyps  . fundiplication for [facility] and reflux  1987  . negative stress test  2008  . ROTATOR CUFF REPAIR  2000  . [place] History  Problem  Relation Age of Onset  . Asthma Mother   . Cancer Mother     uterine   . Alcohol abuse Father   . Stroke Maternal Grandmother     possibly  . Hypertension Maternal Grandfather   . Hypertension Brother   . Diabetes Brother   . Cancer Brother     prostate  . Colon cancer Neg Hx     No Known Allergies  Current Outpatient Prescriptions on File Prior to Visit  Medication Sig Dispense Refill  . glimepiride (AMARYL) 1 MG tablet TAKE 1 TABLET BY MOUTH EVERY DAY WITH BREAKFAST 30 tablet 5  . glucose blood test strip Check blood sugar twice daily. DX: E11.9 100 each 6  . Lancets (FREESTYLE) lancets Check blood sugar twice daily.  DX E11.9 100 each 6  . lisinopril-hydrochlorothiazide (PRINZIDE,ZESTORETIC) 20-12.5 MG  tablet Take 1 tablet by mouth 2 (two) times daily. 180 tablet 1  . metFORMIN (GLUCOPHAGE) 1000 MG tablet One tablet 2 times daily and half a tablet everyday at Noon 180 tablet 2  . metoprolol (TOPROL-XL) 200 MG 24 hr tablet TAKE 1 TABLET (200 MG TOTAL) BY MOUTH DAILY. 90 tablet 1  . Multiple Vitamins-Minerals (PA MENS 50 PLUS VITAPAK PO) Take 1 capsule by mouth daily.    . simvastatin (ZOCOR) 10 MG tablet TAKE 1 TABLET BY MOUTH DAILY AT 6 PM. 90 tablet 1  . TRADJENTA 5 MG TABS tablet TAKE 1 TABLET BY MOUTH EVERY DAY 30 tablet 6  . vitamin B-12 (CYANOCOBALAMIN) 100 MCG tablet Take 100 mcg by mouth daily.    Marland Kitchen testosterone cypionate (DEPOTESTOSTERONE CYPIONATE) 200 MG/ML injection Inject 0.5 mLs (100 mg total) into the muscle once. 10 mL 0   No current facility-administered medications on file prior to visit.     BP 137/72 (BP Location: Left Arm, Patient Position: Sitting, Cuff Size: Large)   Pulse 91   Temp 98.1 F (36.7 C) (Oral)   Resp 16   Ht 5\' 9"  (1.753 m)   Wt 256 lb 4 oz (116.2 kg)   SpO2 98%   BMI 37.84 kg/m       Objective:   Physical Exam  General  Mental Status - Alert. General Appearance - Well groomed. Not in acute distress.  Skin Rt side of face. Over maxillary sinus area. Skin faint warm, tender, indurated and swollen. No fluctuance. Mild swelling. Not obvious at first. But when pointed out by pt swelling is obvious.  HEENT Head- Normal. Ear Auditory Canal - Left- Normal. Right - Normal.Tympanic Membrane- Left- Normal. Right- Normal. Eye Sclera/Conjunctiva- Left- Normal. Right- Normal. Nose & Sinuses Nasal Mucosa- Left-  Boggy and Congested. Right-  Boggy and  Congested.Bilateral maxillary and frontal sinus pressure. Mouth & Throat Lips: Upper Lip- Normal: no dryness, cracking, pallor, cyanosis, or vesicular eruption. Lower Lip-Normal: no dryness, cracking, pallor, cyanosis or vesicular eruption. Buccal Mucosa- Bilateral- No Aphthous ulcers. Oropharynx- No  Discharge or Erythema. Tonsils: Characteristics- Bilateral- No Erythema or Congestion. Size/Enlargement- Bilateral- No enlargement. Discharge- bilateral-None.  Mouth- poor appearance to teeth. Broken off teeth at base rt upper side.  Neck Neck- Supple. No Masses.   Chest and Lung Exam Auscultation: Breath Sounds:-Clear even and unlabored.  Cardiovascular Auscultation:Rythm- Regular, rate and rhythm. Murmurs & Other Heart Sounds:Ausculatation of the heart reveal- No Murmurs.  Lymphatic Head & Neck General Head & Neck Lymphatics: Bilateral: Description- No Localized lymphadenopathy.       Assessment & Plan:  For skin infection we gave rocephin 1 gram  im. We are approaching 3 day weekend and early infection. But may develop cellulitis or abscess. Rx clindamycin and use probiotic otc as discussed.  Get cbc today to assess your wbc. New onset fatigue likely related to infection.  If area worsens as described then you would need ED evaluation for likely IV antibiotics.  Recommend you see a dentist next week as this could be a source of infection though not clear.  Follow up on Monday early afternoon or as needed with ED.  Low sugar diet and take diabetic meds as infection can increase your blood sugars  Based on exam this appears to be soft tissue infection and I don't think sinus infection.   Note I had asked Ivin Booty MA to give Rocephin injection. But depomedrol im was given instead. I called pt immediatley and informed him that wrong medication was given and apologized. Asked him to come back today for the rocephin injection.   I did inform him that regarding the depomedrol im given could  help with nasal congestion as I thought he had some allergies recently.But was wrong med for his skin infection.  Notified by Supervising physcian regarding then error on im injection and she recommended notifying pt immediately and I did so.  Later when pt came back I again apologized to  pt and his wife. Pt wife told me that she heard me order the Rocephin through the door.  Christasia Angeletti, Percell Miller, PA-C

## 2016-11-13 NOTE — Progress Notes (Signed)
Spoke with patient by phone apologized about his shot this afternoon. He is feeling well and agrees to minimize carbohydrate intake for next 24 hours. Made him aware of our review of our process and the steps we will take to insure this does not happen again moving forward.

## 2016-11-13 NOTE — Addendum Note (Signed)
Addended by: Rockwell Germany on: 11/13/2016 11:40 AM   Modules accepted: Orders

## 2016-11-13 NOTE — Telephone Encounter (Signed)
Called patient. Advised per Dr. [name] ok to have his injections given at home by wife. Wife will need to accompany him to next injection appointment for instructions on how to administer. Patient agreed.

## 2016-11-13 NOTE — Progress Notes (Signed)
Pre visit review using our clinic review tool, if applicable. No additional management support is needed unless otherwise documented below in the visit note/SLS  

## 2016-11-13 NOTE — Patient Instructions (Addendum)
For skin infection we gave rocephin 1 gram im. We are approaching 3 day weekend and early infection. But may develop cellulitis or abscess. Rx clindamycin and use probiotic otc as discussed.  Get cbc today to assess your wbc. New onset fatigue likely related to infection.  If area worsens as described then you would need ED evaluation for likely IV antibiotics.  Recommend you see a dentist next week as this could be a source of infection though not clear.  Follow up on Monday early afternoon or as needed with ED.  Low sugar diet and take diabetic meds as infection can increase your blood sugars

## 2016-11-17 ENCOUNTER — Encounter: Payer: Self-pay | Admitting: Medical

## 2016-11-17 ENCOUNTER — Ambulatory Visit (INDEPENDENT_AMBULATORY_CARE_PROVIDER_SITE_OTHER): Payer: BC Managed Care – PPO | Admitting: Medical

## 2016-11-17 VITALS — BP 144/75 | HR 102 | Temp 99.1°F | Resp 16 | Ht 69.0 in | Wt 254.6 lb

## 2016-11-17 DIAGNOSIS — L089 Local infection of the skin and subcutaneous tissue, unspecified: Secondary | ICD-10-CM

## 2016-11-17 NOTE — Progress Notes (Signed)
Pre visit review using our clinic review tool, if applicable. No additional management support is needed unless otherwise documented below in the visit note. 

## 2016-11-17 NOTE — Progress Notes (Signed)
Subjective:    Patient ID: Phillip Doyle, male    DOB: 1956-04-06, 61 y.o.   MRN: [id number]  HPI  Pt in for follow up. Pt states in for follow up. No fevers, no chills, no sweats. No facial pain. The area improved in about 48 hours. Yesterday pt states feels good. No swelling per pt. Wife thinks maybe some residual swelling.    Review of Systems  Constitutional: Negative for chills and fatigue.  HENT: Negative for congestion, drooling, ear discharge, facial swelling, mouth sores, nosebleeds and rhinorrhea.   Respiratory: Negative for cough, choking, chest tightness and wheezing.   Cardiovascular: Negative for chest pain and palpitations.  Musculoskeletal: Negative for back pain, myalgias and neck stiffness.  Skin: Negative for rash.  Neurological: Negative for dizziness, seizures and facial asymmetry.  Hematological: Negative for adenopathy. Does not bruise/bleed easily.  Psychiatric/Behavioral: Negative for behavioral problems, confusion and decreased concentration.    Past Medical History:  Diagnosis Date  . Alcohol abuse 11/28/2013  . ALLERGIC RHINITIS 02/15/2007  . Allergy    rhinitis  . Bronchitis 07/14/2012  . Diabetes mellitus   . DM 10/24/2010  . ED (erectile dysfunction) 02/17/2012  . EXOGENOUS OBESITY 02/15/2007  . H/O alcohol abuse 11/28/2013  . H/O tobacco use, presenting hazards to health 07/04/2010   Qualifier: Diagnosis of  By: [name] MD, Erline Levine  1/2 ppd   . HTN (hypertension) 08/20/2011  . Hypertension   . HYPERTENSION 02/15/2007  . Impaired glucose tolerance   . 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 Low testosterone level in male 08/01/2016  . LUMBAR STRAIN 10/17/2010  . Mixed hyperlipidemia 07/04/2010  . NISSEN FUNDOPLICATION, HX OF 7/35/3299  . OA (osteoarthritis) of knee 02/17/2012  . Obesity   . PEPTIC ULCER DISEASE 02/15/2007  . Personal history of colonic polyps 01/17/2013   colonoscoppy 2013, benign polyp advised repeat in 10 years  . Preventative health  care 07/19/2012  . Restless sleeper 10/30/2016  . TOBACCO ABUSE 07/04/2010  . Ulcer ([place]) 1982   peptic ulcer disease     Social History   Social History  . Marital status: Married    Spouse name: [name]  . Number of children: N/A  . 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 Smoking status: Former Smoker    Types: Cigarettes    Quit date: 06/28/2014  . Smokeless tobacco: Former Systems developer    Quit date: 03/18/2012     Comment: quit 06/18/10- started back   . Alcohol use 0.0 oz/week    3 - 5 Cans of beer per week     Comment: 3-5 beer daily  . Drug use: No  . Sexual activity: Yes    Partners: Female   Other Topics Concern  . Not on file   Social History Narrative  . No narrative on file    Past Surgical History:  Procedure Laterality Date  . COLONOSCOPY  03/2012   Several hyperplastic polyps  . fundiplication for [facility] and reflux  1987  . negative stress test  2008  . ROTATOR CUFF REPAIR  2000  . VASECTOMY  1982    Family History  Problem Relation Age of Onset  . Asthma Mother   . Cancer Mother     uterine   . Alcohol abuse Father   . Stroke Maternal Grandmother     possibly  . Hypertension Maternal Grandfather   . Hypertension Brother   .  Diabetes Brother   . Cancer Brother     prostate  . Colon cancer Neg Hx     No Known Allergies  Current Outpatient Prescriptions on File Prior to Visit  Medication Sig Dispense Refill  . clindamycin (CLEOCIN) 300 MG capsule Take 1 capsule (300 mg total) by mouth 3 (three) times daily. Generic ok. 30 capsule 0  . glimepiride (AMARYL) 1 MG tablet TAKE 1 TABLET BY MOUTH EVERY DAY WITH BREAKFAST 30 tablet 5  . glucose blood test strip Check blood sugar twice daily. DX: E11.9 100 each 6  . Lancets (FREESTYLE) lancets Check blood sugar twice daily.  DX E11.9 100 each 6  . lisinopril-hydrochlorothiazide (PRINZIDE,ZESTORETIC) 20-12.5 MG tablet Take 1 tablet by mouth 2 (two) times daily.  180 tablet 1  . metFORMIN (GLUCOPHAGE) 1000 MG tablet One tablet 2 times daily and half a tablet everyday at Noon 180 tablet 2  . metoprolol (TOPROL-XL) 200 MG 24 hr tablet TAKE 1 TABLET (200 MG TOTAL) BY MOUTH DAILY. 90 tablet 1  . Multiple Vitamins-Minerals (PA MENS 50 PLUS VITAPAK PO) Take 1 capsule by mouth daily.    . simvastatin (ZOCOR) 10 MG tablet TAKE 1 TABLET BY MOUTH DAILY AT 6 PM. 90 tablet 1  . TRADJENTA 5 MG TABS tablet TAKE 1 TABLET BY MOUTH EVERY DAY 30 tablet 6  . vitamin B-12 (CYANOCOBALAMIN) 100 MCG tablet Take 100 mcg by mouth daily.    Marland Kitchen testosterone cypionate (DEPOTESTOSTERONE CYPIONATE) 200 MG/ML injection Inject 0.5 mLs (100 mg total) into the muscle once. 10 mL 0   No current facility-administered medications on file prior to visit.     BP (!) 144/75 (BP Location: Left Arm, Cuff Size: Large)   Pulse (!) 102   Temp 99.1 F (37.3 C) (Oral)   Resp 16   Ht 5\' 9"  (1.753 m)   Wt 254 lb 9.6 oz (115.5 kg)   SpO2 96% Comment: room air  BMI 37.60 kg/m       Objective:   Physical Exam   General  Mental Status - Alert. General Appearance - Well groomed. Not in acute distress.  Skin Rt side of face. Appears symmetric to me and no longer has induration. No pain on palpation. No rash. No vesicles.  HEENT Head- Normal. Ear Auditory Canal - Left- Normal. Right - Normal.Tympanic Membrane- Left- Normal. Right- Normal. Eye Sclera/Conjunctiva- Left- Normal. Right- Normal. Nose & Sinuses Nasal Mucosa- Left-  Boggy and Congested. Right-  Boggy and  Congested.Bilateral no  maxillary and no  frontal sinus pressure. Mouth & Throat Lips: Upper Lip- Normal: no dryness, cracking, pallor, cyanosis, or vesicular eruption. Lower Lip-Normal: no dryness, cracking, pallor, cyanosis or vesicular eruption. Buccal Mucosa- Bilateral- No Aphthous ulcers. Oropharynx- No Discharge or Erythema. Tonsils: Characteristics- Bilateral- No Erythema or Congestion. Size/Enlargement- Bilateral- No  enlargement. Discharge- bilateral-None.  Neck Neck- Supple. No Masses.   Chest and Lung Exam Auscultation: Breath Sounds:-Clear even and unlabored.  Cardiovascular Auscultation:Rythm- Regular, rate and rhythm. Murmurs & Other Heart Sounds:Ausculatation of the heart reveal- No Murmurs.  Lymphatic Head & Neck General Head & Neck Lymphatics: Bilateral: Description- No Localized lymphadenopathy.      Assessment & Plan:  Your skin infection area on your rt side of face looks improved. Continue the clindamycin until you finish full course of treatment.  If at end of antibiotic course you feel any abnormality then reschedule for recheck of area. Convenient appointment would be 8-9 am or 1-2 pm. Otherwise follow up as  needed.  Ronnel Zuercher, Percell Miller, PA-C

## 2016-11-17 NOTE — Patient Instructions (Addendum)
Your skin infection area on your rt side of face looks improved. Continue the clindamycin until you finish full course of treatment.  If at end of antibiotic course you feel any abnormality then reschedule for recheck of area. Convenient appointment would be 8-9 am or 1-2 pm. Otherwise follow up as needed.

## 2016-11-26 ENCOUNTER — Ambulatory Visit (INDEPENDENT_AMBULATORY_CARE_PROVIDER_SITE_OTHER): Payer: BC Managed Care – PPO

## 2016-11-26 DIAGNOSIS — E291 Testicular hypofunction: Secondary | ICD-10-CM

## 2016-11-26 DIAGNOSIS — R7989 Other specified abnormal findings of blood chemistry: Secondary | ICD-10-CM

## 2016-11-26 MED ORDER — "NEEDLE (DISP) 22G X 1-1/2"" MISC": 1.0000 "application " | 1 refills | Status: DC

## 2016-11-26 MED ORDER — TESTOSTERONE ENANTHATE 200 MG/ML IM SOLN
1000.0000 mg | INTRAMUSCULAR | Status: DC
  Administered 2016-11-26: 1000 mg via INTRAMUSCULAR

## 2016-11-26 NOTE — Progress Notes (Signed)
Pre visit review using our clinic tool,if applicable. No additional management support is needed unless otherwise documented below in the visit note.   Patient in for Testosterone injection per order from Dr. Frederik Pear due to low testosterone levels.   Patient given 0.70ml per instruction IM left ventrogluteal muscle. Patient tolerated well.  Per Dr. [name] patient can be given injections at home as long as wife instructed on how to administer. Wife in with patient and given return demonstration on how to administer Testosterone injection to patient. Demonstration successful. Advised to administer injections every 2 weeks and if wife is unavailable to call office and schedule appointment for administration. Patient agreed.   Medication and needled reordered.

## 2016-12-09 ENCOUNTER — Other Ambulatory Visit: Payer: Self-pay | Admitting: Family Medicine

## 2017-01-09 ENCOUNTER — Other Ambulatory Visit: Payer: Self-pay | Admitting: Family Medicine

## 2017-01-21 ENCOUNTER — Institutional Professional Consult (permissible substitution): Payer: BC Managed Care – PPO | Admitting: Internal Medicine

## 2017-01-27 ENCOUNTER — Other Ambulatory Visit (INDEPENDENT_AMBULATORY_CARE_PROVIDER_SITE_OTHER): Payer: BC Managed Care – PPO

## 2017-01-27 DIAGNOSIS — I10 Essential (primary) hypertension: Secondary | ICD-10-CM | POA: Diagnosis not present

## 2017-01-27 DIAGNOSIS — Z0000 Encounter for general adult medical examination without abnormal findings: Secondary | ICD-10-CM

## 2017-01-27 DIAGNOSIS — E1169 Type 2 diabetes mellitus with other specified complication: Secondary | ICD-10-CM

## 2017-01-27 DIAGNOSIS — E669 Obesity, unspecified: Secondary | ICD-10-CM

## 2017-01-27 LAB — COMPREHENSIVE METABOLIC PANEL
ALBUMIN: 5.1 g/dL (ref 3.5–5.2)
ALT: 40 U/L (ref 0–53)
AST: 19 U/L (ref 0–37)
Alkaline Phosphatase: 54 U/L (ref 39–117)
BUN: 12 mg/dL (ref 6–23)
CALCIUM: 10.2 mg/dL (ref 8.4–10.5)
CHLORIDE: 92 meq/L — AB (ref 96–112)
CO2: 29 mEq/L (ref 19–32)
Creatinine, Ser: 0.84 mg/dL (ref 0.40–1.50)
GFR: 99.18 mL/min (ref >60.00)
Glucose, Bld: 124 mg/dL — ABNORMAL HIGH (ref 70–99)
POTASSIUM: 3.8 meq/L (ref 3.5–5.1)
SODIUM: 129 meq/L — AB (ref 135–145)
Total Bilirubin: 0.5 mg/dL (ref 0.2–1.2)
Total Protein: 8 g/dL (ref 6.0–8.3)

## 2017-01-27 LAB — TSH: TSH: 1.16 u[IU]/mL (ref 0.35–4.50)

## 2017-01-27 LAB — CBC
HCT: 42.8 % (ref 39.0–52.0)
HEMOGLOBIN: 14.7 g/dL (ref 13.0–17.0)
MCHC: 34.5 g/dL (ref 30.0–36.0)
MCV: 93.3 fl (ref 78.0–100.0)
PLATELETS: 380 10*3/uL (ref 150.0–400.0)
RBC: 4.59 Mil/uL (ref 4.22–5.81)
RDW: 13.2 % (ref 11.5–15.5)
WBC: 6.3 10*3/uL (ref 4.0–10.5)

## 2017-01-27 LAB — LIPID PANEL
Cholesterol: 110 mg/dL (ref 0–200)
HDL: 41.9 mg/dL (ref >39.00)
LDL CALC: 52 mg/dL (ref 0–99)
NONHDL: 68.2
Total CHOL/HDL Ratio: 3
Triglycerides: 81 mg/dL (ref 0.0–149.0)
VLDL: 16.2 mg/dL (ref 0.0–40.0)

## 2017-01-27 LAB — HEMOGLOBIN A1C: Hgb A1c MFr Bld: 6 % (ref 4.6–6.5)

## 2017-02-03 ENCOUNTER — Ambulatory Visit (INDEPENDENT_AMBULATORY_CARE_PROVIDER_SITE_OTHER): Payer: BC Managed Care – PPO | Admitting: Family Medicine

## 2017-02-03 ENCOUNTER — Encounter: Payer: Self-pay | Admitting: Family Medicine

## 2017-02-03 VITALS — BP 120/70 | HR 83 | Temp 98.5°F | Resp 18 | Ht 69.0 in | Wt 240.2 lb

## 2017-02-03 DIAGNOSIS — I10 Essential (primary) hypertension: Secondary | ICD-10-CM

## 2017-02-03 DIAGNOSIS — E1169 Type 2 diabetes mellitus with other specified complication: Secondary | ICD-10-CM | POA: Diagnosis not present

## 2017-02-03 DIAGNOSIS — E871 Hypo-osmolality and hyponatremia: Secondary | ICD-10-CM | POA: Diagnosis not present

## 2017-02-03 DIAGNOSIS — Z87898 Personal history of other specified conditions: Secondary | ICD-10-CM

## 2017-02-03 DIAGNOSIS — Z0000 Encounter for general adult medical examination without abnormal findings: Secondary | ICD-10-CM

## 2017-02-03 DIAGNOSIS — E6609 Other obesity due to excess calories: Secondary | ICD-10-CM

## 2017-02-03 DIAGNOSIS — R7989 Other specified abnormal findings of blood chemistry: Secondary | ICD-10-CM

## 2017-02-03 DIAGNOSIS — E782 Mixed hyperlipidemia: Secondary | ICD-10-CM

## 2017-02-03 DIAGNOSIS — E669 Obesity, unspecified: Secondary | ICD-10-CM

## 2017-02-03 DIAGNOSIS — F1011 Alcohol abuse, in remission: Secondary | ICD-10-CM

## 2017-02-03 DIAGNOSIS — E349 Endocrine disorder, unspecified: Secondary | ICD-10-CM | POA: Diagnosis not present

## 2017-02-03 LAB — COMPREHENSIVE METABOLIC PANEL
ALBUMIN: 4.9 g/dL (ref 3.5–5.2)
ALT: 36 U/L (ref 0–53)
AST: 17 U/L (ref 0–37)
Alkaline Phosphatase: 57 U/L (ref 39–117)
BUN: 13 mg/dL (ref 6–23)
CALCIUM: 10.1 mg/dL (ref 8.4–10.5)
CHLORIDE: 92 meq/L — AB (ref 96–112)
CO2: 27 mEq/L (ref 19–32)
CREATININE: 0.81 mg/dL (ref 0.40–1.50)
GFR: 103.43 mL/min (ref >60.00)
Glucose, Bld: 129 mg/dL — ABNORMAL HIGH (ref 70–99)
Potassium: 3.9 mEq/L (ref 3.5–5.1)
Sodium: 128 mEq/L — ABNORMAL LOW (ref 135–145)
Total Bilirubin: 0.5 mg/dL (ref 0.2–1.2)
Total Protein: 7.4 g/dL (ref 6.0–8.3)

## 2017-02-03 LAB — TESTOSTERONE: TESTOSTERONE: 276.79 ng/dL — AB (ref 300.00–890.00)

## 2017-02-03 NOTE — Progress Notes (Signed)
Subjective:  I acted as a Education administrator for Dr. [name]. [place], [place]  Patient ID: Phillip Doyle, male    DOB: August 14, 1956, 61 y.o.   MRN: [id number]  Chief Complaint  Patient presents with  . Annual Exam    HPI  Patient is in today for an annual exam. Patient has no acut econcerns. No recent febrile illness or acute hospitalizations. Denies CP/palp/SOB/HA/congestion/fevers/GI or GU c/o. Taking meds as prescribed. No polyuria and polydipsia. Feels well no recent febrile illness or recent hospitlaization. Is exercising and eating smaller portions and less carbs.   Patient Care Team: Mosie Lukes, MD as PCP - General (Family Medicine)   Past Medical History:  Diagnosis Date  . Alcohol abuse 11/28/2013  . ALLERGIC RHINITIS 02/15/2007  . Allergy    rhinitis  . Bronchitis 07/14/2012  . Diabetes mellitus   . DM 10/24/2010  . ED (erectile dysfunction) 02/17/2012  . EXOGENOUS OBESITY 02/15/2007  . H/O alcohol abuse 11/28/2013  . H/O tobacco use, presenting hazards to health 07/04/2010   Qualifier: Diagnosis of  By: [name] MD, Erline Levine  1/2 ppd   . HTN (hypertension) 08/20/2011  . Hypertension   . HYPERTENSION 02/15/2007  . Impaired glucose tolerance   . 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 Low testosterone level in male 08/01/2016  . LUMBAR STRAIN 10/17/2010  . Mixed hyperlipidemia 07/04/2010  . NISSEN FUNDOPLICATION, HX OF 0/27/2536  . OA (osteoarthritis) of knee 02/17/2012  . Obesity   . PEPTIC ULCER DISEASE 02/15/2007  . Personal history of colonic polyps 01/17/2013   colonoscoppy 2013, benign polyp advised repeat in 10 years  . Preventative health care 07/19/2012  . Restless sleeper 10/30/2016  . TOBACCO ABUSE 07/04/2010  . Ulcer 1982   peptic ulcer disease    Past Surgical History:  Procedure Laterality Date  . COLONOSCOPY  03/2012   Several hyperplastic polyps  . fundiplication for [facility] and reflux  1987  . negative stress test  2008  . ROTATOR CUFF REPAIR  2000  . VASECTOMY  1982     Family History  Problem Relation Age of Onset  . Asthma Mother   . Cancer Mother        uterine   . Alcohol abuse Father   . Stroke Maternal Grandmother        possibly  . Hypertension Maternal Grandfather   . Hypertension Brother   . Diabetes Brother   . Cancer Brother        prostate  . Colon cancer Neg Hx     Social History   Social History  . Marital status: Married    Spouse name: [name]  . Number of children: N/A  . 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 Smoking status: Former Smoker    Types: Cigarettes    Quit date: 06/28/2014  . Smokeless tobacco: Former Systems developer    Quit date: 03/18/2012     Comment: quit 06/18/10- started back   . Alcohol use 0.0 oz/week    3 - 5 Cans of beer per week     Comment: 3-5 beer daily  . Drug use: No  . Sexual activity: Yes    Partners: Female   Other Topics Concern  . Not on file   Social History Narrative  . No narrative on file    Outpatient Medications Prior to Visit  Medication Sig Dispense Refill  . clindamycin (CLEOCIN) 300  MG capsule Take 1 capsule (300 mg total) by mouth 3 (three) times daily. Generic ok. 30 capsule 0  . glimepiride (AMARYL) 1 MG tablet TAKE 1 TABLET BY MOUTH EVERY DAY WITH BREAKFAST 30 tablet 5  . glucose blood test strip Check blood sugar twice daily. DX: E11.9 100 each 6  . Lancets (FREESTYLE) lancets Check blood sugar twice daily.  DX E11.9 100 each 6  . lisinopril-hydrochlorothiazide (PRINZIDE,ZESTORETIC) 20-12.5 MG tablet TAKE 1 TABLET BY MOUTH TWICE A DAY 180 tablet 1  . metFORMIN (GLUCOPHAGE) 1000 MG tablet One tablet 2 times daily and half a tablet everyday at Noon 180 tablet 2  . metoprolol (TOPROL-XL) 200 MG 24 hr tablet TAKE 1 TABLET (200 MG TOTAL) BY MOUTH DAILY. 90 tablet 1  . Multiple Vitamins-Minerals (PA MENS 50 PLUS VITAPAK PO) Take 1 capsule by mouth daily.    Marland Kitchen NEEDLE, DISP, 22 G 22G X 1-1/2" MISC Inject 1 application into the  muscle every 14 (fourteen) days. 100 each 1  . simvastatin (ZOCOR) 10 MG tablet TAKE 1 TABLET BY MOUTH DAILY AT 6 PM. 90 tablet 1  . TRADJENTA 5 MG TABS tablet TAKE 1 TABLET BY MOUTH EVERY DAY 30 tablet 6  . vitamin B-12 (CYANOCOBALAMIN) 100 MCG tablet Take 100 mcg by mouth daily.    Marland Kitchen testosterone cypionate (DEPOTESTOSTERONE CYPIONATE) 200 MG/ML injection Inject 0.5 mLs (100 mg total) into the muscle once. 10 mL 0   Facility-Administered Medications Prior to Visit  Medication Dose Route Frequency Provider Last Rate Last Dose  . testosterone enanthate (DELATESTRYL) injection 1,000 mg  1,000 mg Intramuscular Q14 Days Penni Homans A, MD   1,000 mg at 11/26/16 1020    No Known Allergies  Review of Systems  Constitutional: Negative for chills, fever and malaise/fatigue.  HENT: Negative for congestion and hearing loss.   Eyes: Negative for discharge.  Respiratory: Negative for cough, sputum production and shortness of breath.   Cardiovascular: Negative for chest pain, palpitations and leg swelling.  Gastrointestinal: Negative for abdominal pain, blood in stool, constipation, diarrhea, heartburn, nausea and vomiting.  Genitourinary: Negative for dysuria, frequency, hematuria and urgency.  Musculoskeletal: Negative for back pain, falls and myalgias.  Skin: Negative for rash.  Neurological: Negative for dizziness, sensory change, loss of consciousness, weakness and headaches.  Endo/Heme/Allergies: Negative for environmental allergies. Does not bruise/bleed easily.  Psychiatric/Behavioral: Negative for depression and suicidal ideas. The patient is not nervous/anxious and does not have insomnia.        Objective:    Physical Exam  Constitutional: He is oriented to person, place, and time. He appears well-developed and well-nourished. No distress.  HENT:  Head: Normocephalic and atraumatic.  Eyes: Conjunctivae are normal.  Neck: Neck supple. No thyromegaly present.  Cardiovascular: Normal  rate, regular rhythm and normal heart sounds.   No murmur heard. Pulmonary/Chest: Effort normal and breath sounds normal. No respiratory distress. He has no wheezes.  Abdominal: Soft. Bowel sounds are normal. He exhibits no mass. There is no tenderness.  Musculoskeletal: He exhibits no edema.  Lymphadenopathy:    He has no cervical adenopathy.  Neurological: He is alert and oriented to person, place, and time.  Skin: Skin is warm and dry.  Psychiatric: He has a normal mood and affect. His behavior is normal.    BP 120/70 (BP Location: Left Arm, Patient Position: Sitting, Cuff Size: Large)   Pulse 83   Temp 98.5 F (36.9 C) (Oral)   Resp 18   Ht 5\' 9"  (  1.753 m)   Wt 240 lb 3.2 oz (109 kg)   SpO2 97%   BMI 35.47 kg/m  Wt Readings from Last 3 Encounters:  02/03/17 240 lb 3.2 oz (109 kg)  11/17/16 254 lb 9.6 oz (115.5 kg)  11/13/16 256 lb 4 oz (116.2 kg)   BP Readings from Last 3 Encounters:  02/03/17 120/70  11/17/16 (!) 144/75  11/13/16 137/72     Immunization History  Administered Date(s) Administered  . Influenza Split 05/19/2011, 07/14/2012  . Influenza Whole 10/17/2010  . Influenza,inj,Quad PF,36+ Mos 05/24/2013, 05/29/2014, 05/31/2015, 04/15/2016  . Pneumococcal Conjugate-13 05/24/2013  . Pneumococcal Polysaccharide-23 05/31/2015  . Td 10/17/2010    Health Maintenance  Topic Date Due  . INFLUENZA VACCINE  03/18/2017  . OPHTHALMOLOGY EXAM  04/18/2017  . HEMOGLOBIN A1C  07/29/2017  . FOOT EXAM  10/30/2017  . PNEUMOCOCCAL POLYSACCHARIDE VACCINE (2) 05/30/2020  . TETANUS/TDAP  10/16/2020  . COLONOSCOPY  04/12/2022  . Hepatitis C Screening  Completed  . HIV Screening  Completed    Lab Results  Component Value Date   WBC 6.3 01/27/2017   HGB 14.7 01/27/2017   HCT 42.8 01/27/2017   PLT 380.0 01/27/2017   GLUCOSE 124 (H) 01/27/2017   CHOL 110 01/27/2017   TRIG 81.0 01/27/2017   HDL 41.90 01/27/2017   LDLDIRECT 101.3 01/28/2008   LDLCALC 52 01/27/2017    ALT 40 01/27/2017   AST 19 01/27/2017   NA 129 (L) 01/27/2017   K 3.8 01/27/2017   CL 92 (L) 01/27/2017   CREATININE 0.84 01/27/2017   BUN 12 01/27/2017   CO2 29 01/27/2017   TSH 1.16 01/27/2017   PSA 0.34 11/23/2014   HGBA1C 6.0 01/27/2017   MICROALBUR 2.5 (H) 05/31/2015    Lab Results  Component Value Date   TSH 1.16 01/27/2017   Lab Results  Component Value Date   WBC 6.3 01/27/2017   HGB 14.7 01/27/2017   HCT 42.8 01/27/2017   MCV 93.3 01/27/2017   PLT 380.0 01/27/2017   Lab Results  Component Value Date   NA 129 (L) 01/27/2017   K 3.8 01/27/2017   CO2 29 01/27/2017   GLUCOSE 124 (H) 01/27/2017   BUN 12 01/27/2017   CREATININE 0.84 01/27/2017   BILITOT 0.5 01/27/2017   ALKPHOS 54 01/27/2017   AST 19 01/27/2017   ALT 40 01/27/2017   PROT 8.0 01/27/2017   ALBUMIN 5.1 01/27/2017   CALCIUM 10.2 01/27/2017   GFR 99.18 01/27/2017   Lab Results  Component Value Date   CHOL 110 01/27/2017   Lab Results  Component Value Date   HDL 41.90 01/27/2017   Lab Results  Component Value Date   LDLCALC 52 01/27/2017   Lab Results  Component Value Date   TRIG 81.0 01/27/2017   Lab Results  Component Value Date   CHOLHDL 3 01/27/2017   Lab Results  Component Value Date   HGBA1C 6.0 01/27/2017         Assessment & Plan:   Problem List Items Addressed This Visit    MIXED HYPERLIPIDEMIA    Tolerating statin, encouraged heart healthy diet, avoid trans fats, minimize simple carbs and saturated fats. Increase exercise as tolerated      Relevant Orders   Lipid panel   Obesity    Encouraged DASH diet, decrease po intake and increase exercise as tolerated. Needs 7-8 hours of sleep nightly. Avoid trans fats, eat small, frequent meals every 4-5 hours with lean proteins, complex carbs  and healthy fats. Minimize simple carbs, bariatric referral is considered.       Diabetes mellitus type 2 in obese (HCC)    hgba1c acceptable, minimize simple carbs. Increase  exercise as tolerated. Continue current meds      Relevant Orders   Hemoglobin A1c   HTN (hypertension) - Primary    Well controlled, no changes to meds. Encouraged heart healthy diet such as the DASH diet and exercise as tolerated.       Relevant Orders   CBC   Comprehensive metabolic panel   TSH   Preventative health care    Patient encouraged to maintain heart healthy diet, regular exercise, adequate sleep. Consider daily probiotics. Take medications as prescribed. Encouraged to bring [place] a copy Advanced Directives paperwork      H/O alcohol abuse    Doing well drinking very little      Low testosterone in male    Tolerating Testosterone shots. Will check levels today      Hyponatremia    Recheck level today, is asymptomatic, decrease beverages by one a day      Relevant Orders   Comprehensive metabolic panel    Other Visit Diagnoses    Hypotestosteronemia       Relevant Orders   Testosterone   Testosterone   PSA      I am having Mr. Carrigan maintain his Multiple Vitamins-Minerals (PA MENS 50 PLUS VITAPAK PO), vitamin B-12, glucose blood, freestyle, glimepiride, metFORMIN, testosterone cypionate, clindamycin, NEEDLE (DISP) 22 G, TRADJENTA, metoprolol, lisinopril-hydrochlorothiazide, and simvastatin. We will continue to administer testosterone enanthate.  No orders of the defined types were placed in this encounter.   CMA served as Education administrator during this visit. History, Physical and Plan performed by medical provider. Documentation and orders reviewed and attested to.  Penni Homans, MD

## 2017-02-03 NOTE — Assessment & Plan Note (Signed)
Tolerating statin, encouraged heart healthy diet, avoid trans fats, minimize simple carbs and saturated fats. Increase exercise as tolerated 

## 2017-02-03 NOTE — Assessment & Plan Note (Signed)
Doing well drinking very little

## 2017-02-03 NOTE — Assessment & Plan Note (Signed)
Tolerating Testosterone shots. Will check levels today

## 2017-02-03 NOTE — Assessment & Plan Note (Signed)
Encouraged DASH diet, decrease po intake and increase exercise as tolerated. Needs 7-8 hours of sleep nightly. Avoid trans fats, eat small, frequent meals every 4-5 hours with lean proteins, complex carbs and healthy fats. Minimize simple carbs, bariatric referral is considered.

## 2017-02-03 NOTE — Patient Instructions (Addendum)
HYDRATE HYDRATE HYDRATE 64 OUNCES OF WATER A DAY  Shingrix shot prevents shingles, call insurance and make sure they cover then can call for a nurse visit for shot.   Preventive Care 40-64 Years, Male Preventive care refers to lifestyle choices and visits with your health care provider that can promote health and wellness. What does preventive care include?  A yearly physical exam. This is also called an annual well check.  Dental exams once or twice a year.  Routine eye exams. Ask your health care provider how often you should have your eyes checked.  Personal lifestyle choices, including: ? Daily care of your teeth and gums. ? Regular physical activity. ? Eating a healthy diet. ? Avoiding tobacco and drug use. ? Limiting alcohol use. ? Practicing safe sex. ? Taking low-dose aspirin every day starting at age 26. What happens during an annual well check? The services and screenings done by your health care provider during your annual well check will depend on your age, overall health, lifestyle risk factors, and family history of disease. Counseling Your health care provider may ask you questions about your:  Alcohol use.  Tobacco use.  Drug use.  Emotional well-being.  Home and relationship well-being.  Sexual activity.  Eating habits.  Work and work Astronomer.  Screening You may have the following tests or measurements:  Height, weight, and BMI.  Blood pressure.  Lipid and cholesterol levels. These may be checked every 5 years, or more frequently if you are over 70 years old.  Skin check.  Lung cancer screening. You may have this screening every year starting at age 68 if you have a 30-pack-year history of smoking and currently smoke or have quit within the past 15 years.  Fecal occult blood test (FOBT) of the stool. You may have this test every year starting at age 73.  Flexible sigmoidoscopy or colonoscopy. You may have a sigmoidoscopy every 5 years or  a colonoscopy every 10 years starting at age 66.  Prostate cancer screening. Recommendations will vary depending on your family history and other risks.  Hepatitis C blood test.  Hepatitis B blood test.  Sexually transmitted disease (STD) testing.  Diabetes screening. This is done by checking your blood sugar (glucose) after you have not eaten for a while (fasting). You may have this done every 1-3 years.  Discuss your test results, treatment options, and if necessary, the need for more tests with your health care provider. Vaccines Your health care provider may recommend certain vaccines, such as:  Influenza vaccine. This is recommended every year.  Tetanus, diphtheria, and acellular pertussis (Tdap, Td) vaccine. You may need a Td booster every 10 years.  Varicella vaccine. You may need this if you have not been vaccinated.  Zoster vaccine. You may need this after age 60.  Measles, mumps, and rubella (MMR) vaccine. You may need at least one dose of MMR if you were born in 1957 or later. You may also need a second dose.  Pneumococcal 13-valent conjugate (PCV13) vaccine. You may need this if you have certain conditions and have not been vaccinated.  Pneumococcal polysaccharide (PPSV23) vaccine. You may need one or two doses if you smoke cigarettes or if you have certain conditions.  Meningococcal vaccine. You may need this if you have certain conditions.  Hepatitis A vaccine. You may need this if you have certain conditions or if you travel or work in places where you may be exposed to hepatitis A.  Hepatitis B vaccine.  You may need this if you have certain conditions or if you travel or work in places where you may be exposed to hepatitis B.  Haemophilus influenzae type b (Hib) vaccine. You may need this if you have certain risk factors.  Talk to your health care provider about which screenings and vaccines you need and how often you need them. This information is not intended  to replace advice given to you by your health care provider. Make sure you discuss any questions you have with your health care provider. Document Released: 08/31/2015 Document Revised: 04/23/2016 Document Reviewed: 06/05/2015 Elsevier Interactive Patient Education  2017 [employer].

## 2017-02-03 NOTE — Assessment & Plan Note (Signed)
Patient encouraged to maintain heart healthy diet, regular exercise, adequate sleep. Consider daily probiotics. Take medications as prescribed. Encouraged to bring [place] a copy Advanced Directives paperwork

## 2017-02-03 NOTE — Assessment & Plan Note (Signed)
Recheck level today, is asymptomatic, decrease beverages by one a day

## 2017-02-03 NOTE — Assessment & Plan Note (Signed)
Well controlled, no changes to meds. Encouraged heart healthy diet such as the DASH diet and exercise as tolerated.  °

## 2017-02-03 NOTE — Assessment & Plan Note (Signed)
hgba1c acceptable, minimize simple carbs. Increase exercise as tolerated. Continue current meds 

## 2017-02-04 ENCOUNTER — Other Ambulatory Visit: Payer: Self-pay | Admitting: Family Medicine

## 2017-02-04 DIAGNOSIS — E871 Hypo-osmolality and hyponatremia: Secondary | ICD-10-CM

## 2017-02-09 ENCOUNTER — Other Ambulatory Visit: Payer: Self-pay

## 2017-02-09 MED ORDER — "NEEDLE (DISP) 22G X 1-1/2"" MISC": 1.0000 "application " | 1 refills | Status: DC

## 2017-03-06 ENCOUNTER — Other Ambulatory Visit (INDEPENDENT_AMBULATORY_CARE_PROVIDER_SITE_OTHER): Payer: BC Managed Care – PPO

## 2017-03-06 DIAGNOSIS — E871 Hypo-osmolality and hyponatremia: Secondary | ICD-10-CM

## 2017-03-06 LAB — COMPREHENSIVE METABOLIC PANEL
ALBUMIN: 4.6 g/dL (ref 3.5–5.2)
ALK PHOS: 58 U/L (ref 39–117)
ALT: 33 U/L (ref 0–53)
AST: 17 U/L (ref 0–37)
BILIRUBIN TOTAL: 0.3 mg/dL (ref 0.2–1.2)
BUN: 12 mg/dL (ref 6–23)
CALCIUM: 10 mg/dL (ref 8.4–10.5)
CO2: 29 mEq/L (ref 19–32)
CREATININE: 0.9 mg/dL (ref 0.40–1.50)
Chloride: 96 mEq/L (ref 96–112)
GFR: 91.56 mL/min (ref >60.00)
Glucose, Bld: 118 mg/dL — ABNORMAL HIGH (ref 70–99)
Potassium: 4.3 mEq/L (ref 3.5–5.1)
Sodium: 132 mEq/L — ABNORMAL LOW (ref 135–145)
TOTAL PROTEIN: 7.2 g/dL (ref 6.0–8.3)

## 2017-03-12 ENCOUNTER — Other Ambulatory Visit: Payer: Self-pay | Admitting: Family Medicine

## 2017-04-05 ENCOUNTER — Other Ambulatory Visit: Payer: Self-pay | Admitting: Family Medicine

## 2017-05-06 ENCOUNTER — Encounter: Payer: Self-pay | Admitting: Internal Medicine

## 2017-05-06 ENCOUNTER — Ambulatory Visit (INDEPENDENT_AMBULATORY_CARE_PROVIDER_SITE_OTHER): Payer: BC Managed Care – PPO | Admitting: Internal Medicine

## 2017-05-06 VITALS — BP 122/80 | HR 93 | Ht 69.0 in | Wt 231.2 lb

## 2017-05-06 DIAGNOSIS — G4733 Obstructive sleep apnea (adult) (pediatric): Secondary | ICD-10-CM | POA: Diagnosis not present

## 2017-05-06 NOTE — Progress Notes (Signed)
05/06/17- 61 year old male former smoker for sleep evaluation Medical problem list includes allergic rhinitis, DM 2, HBP, GERD/Nissen,  Not diagnosed with COPD but smoked 2 packs per day 40 years. Pt gets up once or twice each night to use the restroom, goes to sleep 9:30pm wakes up  4:30am daily. Pt states he snores, and restless sleeping. Had sleep study completed Phillip Doyle Long 11 years ago. Pt referred by Dr. Penni Doyle. NPSG 07/26/06- AHI  16/ hr, desaturation to 81%, body weight  250 lbs Never treated after her original sleep study. Wife tells him he snores loudly, tosses and turns. No complex parasomnias reported. K Zhou nap on weekends. No sleep medicines. 2 cups of morning coffee. No ENT surgery. Upper denture plate. Epworth score 6/24  Prior to Admission medications   Medication Sig Start Date End Date Taking? Authorizing Provider  clindamycin (CLEOCIN) 300 MG capsule Take 1 capsule (300 mg total) by mouth 3 (three) times daily. Generic ok. 11/13/16  Yes Phillip Doyle, Phillip Miller, PA-C  glimepiride (AMARYL) 1 MG tablet TAKE 1 TABLET BY MOUTH EVERY DAY WITH BREAKFAST 04/06/17  Yes Phillip Doyle A, MD  glucose blood test strip Check blood sugar twice daily. DX: E11.9 08/01/16  Yes Phillip Lukes, MD  Lancets (FREESTYLE) lancets Check blood sugar twice daily.  DX E11.9 08/01/16  Yes Phillip Lukes, MD  lisinopril-hydrochlorothiazide (PRINZIDE,ZESTORETIC) 20-12.5 MG tablet TAKE 1 TABLET BY MOUTH TWICE A DAY 01/09/17  Yes Phillip Lukes, MD  metFORMIN (GLUCOPHAGE) 1000 MG tablet One tablet 2 times daily and half a tablet everyday at [facility] 10/30/16  Yes Phillip Lukes, MD  metFORMIN (GLUCOPHAGE) 1000 MG tablet TAKE 1 TABLET BY MOUTH 2 TIMES DAILY WITH A MEAL. 03/12/17  Yes Phillip Lukes, MD  metoprolol (TOPROL-XL) 200 MG 24 hr tablet TAKE 1 TABLET (200 MG TOTAL) BY MOUTH DAILY. 01/09/17  Yes Phillip Lukes, MD  Multiple Vitamins-Minerals (PA MENS 50 PLUS VITAPAK PO) Take 1 capsule by mouth daily.   Yes  [provider]  NEEDLE, DISP, 22 G 22G X 1-1/2" MISC Inject 1 application into the muscle every 14 (fourteen) days. 02/09/17  Yes Phillip Lukes, MD  simvastatin (ZOCOR) 10 MG tablet TAKE 1 TABLET BY MOUTH DAILY AT 6 PM. 01/09/17  Yes Phillip Lukes, MD  TRADJENTA 5 MG TABS tablet TAKE 1 TABLET BY MOUTH EVERY DAY 12/09/16  Yes Phillip Lukes, MD  vitamin B-12 (CYANOCOBALAMIN) 100 MCG tablet Take 100 mcg by mouth daily.   Yes [provider]  testosterone cypionate (DEPOTESTOSTERONE CYPIONATE) 200 MG/ML injection Inject 0.5 mLs (100 mg total) into the muscle once. 11/12/16 11/12/16  Phillip Lukes, MD   Past Medical History:  Diagnosis Date  . Alcohol abuse 11/28/2013  . ALLERGIC RHINITIS 02/15/2007  . Allergy    rhinitis  . Bronchitis 07/14/2012  . Diabetes mellitus   . DM 10/24/2010  . ED (erectile dysfunction) 02/17/2012  . EXOGENOUS OBESITY 02/15/2007  . H/O alcohol abuse 11/28/2013  . H/O tobacco use, presenting hazards to health 07/04/2010   Qualifier: Diagnosis of  By: Phillip Doyle  1/2 ppd   . HTN (hypertension) 08/20/2011  . Hypertension   . HYPERTENSION 02/15/2007  . Impaired glucose tolerance   . 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 Low testosterone level in male 08/01/2016  . LUMBAR STRAIN 10/17/2010  . Mixed hyperlipidemia 07/04/2010  . NISSEN FUNDOPLICATION, HX OF 03/27/1750  . OA (osteoarthritis) of knee 02/17/2012  . Obesity   .  PEPTIC ULCER DISEASE 02/15/2007  . Personal history of colonic polyps 01/17/2013   colonoscoppy 2013, benign polyp advised repeat in 10 years  . Preventative health care 07/19/2012  . Restless sleeper 10/30/2016  . TOBACCO ABUSE 07/04/2010  . Ulcer 1982   peptic ulcer disease   Past Surgical History:  Procedure Laterality Date  . COLONOSCOPY  03/2012   Several hyperplastic polyps  . fundiplication for [facility] and reflux  1987  . negative stress test  2008  . ROTATOR CUFF REPAIR  2000  . VASECTOMY  1982   Family History  Problem  Relation Age of Onset  . Asthma Mother   . Cancer Mother        uterine   . Alcohol abuse Father   . Stroke Maternal Grandmother        possibly  . Hypertension Maternal Grandfather   . Hypertension Brother   . Diabetes Brother   . Cancer Brother        prostate  . Colon cancer Neg Hx    Social History   Social History  . Marital status: Married    Spouse name: Phillip Doyle  . Number of children: N/A  . 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 Smoking status: Former Smoker    Packs/day: 2.00    Years: 40.00    Types: Cigarettes    Quit date: 06/28/2014  . Smokeless tobacco: Former Systems developer    Quit date: 03/18/2012     Comment: quit 06/18/10- started back   . Alcohol use 0.0 oz/week    3 - 5 Cans of beer per week     Comment: 3-5 beer daily  . Drug use: No  . Sexual activity: Yes    Partners: Female   Other Topics Concern  . Not on file   Social History Narrative  . No narrative on file   ROS-see HPI   + = Positive Constitutional:    weight loss, night sweats, fevers, chills, fatigue, lassitude. HEENT:    headaches, difficulty swallowing, +tooth/dental problems, sore throat,       sneezing, itching, ear ache, nasal congestion, post nasal drip, snoring CV:    chest pain, orthopnea, PND, swelling in lower extremities, anasarca,                                                      dizziness, palpitations Resp:   shortness of breath with exertion or at rest.                productive cough,   non-productive cough, coughing up of blood.              change in color of mucus.  wheezing.   Skin:    rash or lesions. GI: +heartburn, indigestion, abdominal pain, nausea, vomiting, diarrhea,                 change in bowel habits, loss of appetite GU: dysuria, change in color of urine, no urgency or frequency.   flank pain. MS:   joint pain, stiffness, decreased range of motion, back pain. Neuro-     nothing unusual Psych:  change in  mood or affect.  depression or anxiety.   memory loss.  OBJ- Physical Exam General-  Alert, Oriented, Affect-appropriate, Distress- none acute Skin- rash-none, lesions- none, excoriation- none Lymphadenopathy- none Head- atraumatic            Eyes- Gross vision intact, PERRLA, conjunctivae and secretions clear            Ears- Hearing, canals-normal            Nose- Clear, no-Septal dev, mucus, polyps, erosion, perforation             Throat- Mallampati IV , mucosa clear , drainage- none, tonsils- atrophic, + upper denture plate Neck- flexible , trachea midline, no stridor , thyroid nl, carotid no bruit Chest - symmetrical excursion , unlabored           Heart/CV- RRR , no murmur , no gallop  , no rub, nl s1 s2                           - JVD- none , edema- none, stasis changes- none, varices- none           Lung- clear to P&A, wheeze- none, cough- none , dullness-none, rub- none           Chest wall-  Abd-  Br/ Gen/ Rectal- Not done, not indicated Extrem- cyanosis- none, clubbing, none, atrophy- none, strength- nl Neuro- grossly intact to observation

## 2017-05-06 NOTE — Patient Instructions (Signed)
Order- schedule unattended home sleep test    Dx OSA   Please call me about 2 weeks after your sleep study to get results and recommendations

## 2017-05-07 DIAGNOSIS — R0683 Snoring: Secondary | ICD-10-CM | POA: Insufficient documentation

## 2017-05-07 LAB — HM DIABETES EYE EXAM

## 2017-05-07 NOTE — Assessment & Plan Note (Signed)
High probability he still has obstructive sleep apnea. Basic educational discussion of sleep hygiene, sleep apnea, responsibility to drive safely, common treatments, questions answered. He might be able to tolerate a nasal pillows mask. With his upper denture plate he is not currently a candidate for an oral appliance and he is not interested in surgery. Plan-schedule sleep study then anticipate CPAP.

## 2017-05-13 DIAGNOSIS — R0683 Snoring: Secondary | ICD-10-CM | POA: Diagnosis not present

## 2017-05-15 ENCOUNTER — Other Ambulatory Visit: Payer: Self-pay | Admitting: *Deleted

## 2017-05-15 DIAGNOSIS — R0683 Snoring: Secondary | ICD-10-CM | POA: Diagnosis not present

## 2017-05-15 DIAGNOSIS — G4733 Obstructive sleep apnea (adult) (pediatric): Secondary | ICD-10-CM

## 2017-05-15 DIAGNOSIS — J309 Allergic rhinitis, unspecified: Secondary | ICD-10-CM

## 2017-05-22 ENCOUNTER — Encounter: Payer: Self-pay | Admitting: Family Medicine

## 2017-06-11 ENCOUNTER — Ambulatory Visit (INDEPENDENT_AMBULATORY_CARE_PROVIDER_SITE_OTHER): Payer: BC Managed Care – PPO | Admitting: Internal Medicine

## 2017-06-11 DIAGNOSIS — G4733 Obstructive sleep apnea (adult) (pediatric): Secondary | ICD-10-CM

## 2017-06-11 DIAGNOSIS — J309 Allergic rhinitis, unspecified: Secondary | ICD-10-CM

## 2017-06-11 LAB — PULMONARY FUNCTION TEST
DL/VA % pred: 127 %
DL/VA: 5.72 ml/min/mmHg/L
DLCO COR % PRED: 111 %
DLCO COR: 32.98 ml/min/mmHg
DLCO unc % pred: 108 %
DLCO unc: 32.11 ml/min/mmHg
FEF 25-75 POST: 1.74 L/s
FEF 25-75 PRE: 1.14 L/s
FEF2575-%Change-Post: 52 %
FEF2575-%PRED-PRE: 40 %
FEF2575-%Pred-Post: 62 %
FEV1-%Change-Post: 16 %
FEV1-%Pred-Post: 66 %
FEV1-%Pred-Pre: 57 %
FEV1-Post: 2.23 L
FEV1-Pre: 1.92 L
FEV1FVC-%Change-Post: 7 %
FEV1FVC-%PRED-PRE: 83 %
FEV6-%CHANGE-POST: 8 %
FEV6-%PRED-POST: 77 %
FEV6-%Pred-Pre: 71 %
FEV6-POST: 3.26 L
FEV6-Pre: 3.01 L
FEV6FVC-%CHANGE-POST: 0 %
FEV6FVC-%PRED-POST: 105 %
FEV6FVC-%Pred-Pre: 105 %
FVC-%CHANGE-POST: 8 %
FVC-%PRED-POST: 73 %
FVC-%Pred-Pre: 67 %
FVC-PRE: 3.02 L
FVC-Post: 3.26 L
PRE FEV1/FVC RATIO: 64 %
Post FEV1/FVC ratio: 68 %
Post FEV6/FVC ratio: 100 %
Pre FEV6/FVC Ratio: 100 %

## 2017-06-11 NOTE — Progress Notes (Signed)
PFT done today. 

## 2017-07-03 ENCOUNTER — Other Ambulatory Visit: Payer: Self-pay

## 2017-07-03 MED ORDER — SIMVASTATIN 10 MG PO TABS: ORAL_TABLET | ORAL | 1 refills | Status: DC

## 2017-07-06 ENCOUNTER — Other Ambulatory Visit: Payer: Self-pay

## 2017-07-06 MED ORDER — LISINOPRIL-HYDROCHLOROTHIAZIDE 20-12.5 MG PO TABS: 1.0000 | ORAL_TABLET | Freq: Two times a day (BID) | ORAL | 1 refills | Status: DC

## 2017-07-06 MED ORDER — METOPROLOL SUCCINATE ER 200 MG PO TB24: 200.0000 mg | ORAL_TABLET | Freq: Every day | ORAL | 1 refills | Status: DC

## 2017-07-08 ENCOUNTER — Other Ambulatory Visit: Payer: Self-pay | Admitting: Family Medicine

## 2017-07-24 ENCOUNTER — Other Ambulatory Visit: Payer: Self-pay

## 2017-07-24 DIAGNOSIS — R7989 Other specified abnormal findings of blood chemistry: Secondary | ICD-10-CM

## 2017-07-24 MED ORDER — TESTOSTERONE CYPIONATE 200 MG/ML IM SOLN: 100.0000 mg | Freq: Once | INTRAMUSCULAR | 0 refills | Status: DC

## 2017-07-30 ENCOUNTER — Other Ambulatory Visit (INDEPENDENT_AMBULATORY_CARE_PROVIDER_SITE_OTHER): Payer: BC Managed Care – PPO

## 2017-07-30 DIAGNOSIS — E669 Obesity, unspecified: Secondary | ICD-10-CM | POA: Diagnosis not present

## 2017-07-30 DIAGNOSIS — E1169 Type 2 diabetes mellitus with other specified complication: Secondary | ICD-10-CM

## 2017-07-30 DIAGNOSIS — E782 Mixed hyperlipidemia: Secondary | ICD-10-CM | POA: Diagnosis not present

## 2017-07-30 DIAGNOSIS — E349 Endocrine disorder, unspecified: Secondary | ICD-10-CM | POA: Diagnosis not present

## 2017-07-30 DIAGNOSIS — I10 Essential (primary) hypertension: Secondary | ICD-10-CM | POA: Diagnosis not present

## 2017-07-30 LAB — LIPID PANEL
CHOLESTEROL: 122 mg/dL (ref 0–200)
HDL: 48.8 mg/dL (ref >39.00)
LDL CALC: 49 mg/dL (ref 0–99)
NonHDL: 73.64
TRIGLYCERIDES: 125 mg/dL (ref 0.0–149.0)
Total CHOL/HDL Ratio: 3
VLDL: 25 mg/dL (ref 0.0–40.0)

## 2017-07-30 LAB — COMPREHENSIVE METABOLIC PANEL
ALBUMIN: 4.9 g/dL (ref 3.5–5.2)
ALT: 23 U/L (ref 0–53)
AST: 14 U/L (ref 0–37)
Alkaline Phosphatase: 76 U/L (ref 39–117)
BILIRUBIN TOTAL: 0.6 mg/dL (ref 0.2–1.2)
BUN: 12 mg/dL (ref 6–23)
CALCIUM: 9.7 mg/dL (ref 8.4–10.5)
CHLORIDE: 92 meq/L — AB (ref 96–112)
CO2: 29 mEq/L (ref 19–32)
CREATININE: 0.78 mg/dL (ref 0.40–1.50)
GFR: 107.86 mL/min (ref >60.00)
Glucose, Bld: 109 mg/dL — ABNORMAL HIGH (ref 70–99)
Potassium: 4.6 mEq/L (ref 3.5–5.1)
SODIUM: 129 meq/L — AB (ref 135–145)
Total Protein: 7.8 g/dL (ref 6.0–8.3)

## 2017-07-30 LAB — CBC
HEMATOCRIT: 44 % (ref 39.0–52.0)
Hemoglobin: 15 g/dL (ref 13.0–17.0)
MCHC: 34.1 g/dL (ref 30.0–36.0)
MCV: 93.7 fl (ref 78.0–100.0)
Platelets: 370 10*3/uL (ref 150.0–400.0)
RBC: 4.69 Mil/uL (ref 4.22–5.81)
RDW: 13.2 % (ref 11.5–15.5)
WBC: 7 10*3/uL (ref 4.0–10.5)

## 2017-07-30 LAB — PSA: PSA: 0.27 ng/mL (ref 0.10–4.00)

## 2017-07-30 LAB — TSH: TSH: 1.21 u[IU]/mL (ref 0.35–4.50)

## 2017-07-30 LAB — TESTOSTERONE: TESTOSTERONE: 235.66 ng/dL — AB (ref 300.00–890.00)

## 2017-07-30 LAB — HEMOGLOBIN A1C: HEMOGLOBIN A1C: 6 % (ref 4.6–6.5)

## 2017-08-06 ENCOUNTER — Ambulatory Visit (INDEPENDENT_AMBULATORY_CARE_PROVIDER_SITE_OTHER): Payer: BC Managed Care – PPO | Admitting: Family Medicine

## 2017-08-06 ENCOUNTER — Encounter: Payer: Self-pay | Admitting: Family Medicine

## 2017-08-06 VITALS — BP 142/76 | HR 77 | Temp 97.9°F | Resp 18 | Wt 242.6 lb

## 2017-08-06 DIAGNOSIS — E782 Mixed hyperlipidemia: Secondary | ICD-10-CM | POA: Diagnosis not present

## 2017-08-06 DIAGNOSIS — E118 Type 2 diabetes mellitus with unspecified complications: Secondary | ICD-10-CM

## 2017-08-06 DIAGNOSIS — E1169 Type 2 diabetes mellitus with other specified complication: Secondary | ICD-10-CM | POA: Diagnosis not present

## 2017-08-06 DIAGNOSIS — I10 Essential (primary) hypertension: Secondary | ICD-10-CM

## 2017-08-06 DIAGNOSIS — E6609 Other obesity due to excess calories: Secondary | ICD-10-CM

## 2017-08-06 DIAGNOSIS — S335XXD Sprain of ligaments of lumbar spine, subsequent encounter: Secondary | ICD-10-CM

## 2017-08-06 DIAGNOSIS — Z0000 Encounter for general adult medical examination without abnormal findings: Secondary | ICD-10-CM

## 2017-08-06 DIAGNOSIS — E669 Obesity, unspecified: Secondary | ICD-10-CM

## 2017-08-06 DIAGNOSIS — R7989 Other specified abnormal findings of blood chemistry: Secondary | ICD-10-CM

## 2017-08-06 MED ORDER — SITAGLIPTIN PHOSPHATE 100 MG PO TABS: 100.0000 mg | ORAL_TABLET | Freq: Every day | ORAL | 5 refills | Status: DC

## 2017-08-06 NOTE — Assessment & Plan Note (Signed)
Tolerating supplements.

## 2017-08-06 NOTE — Assessment & Plan Note (Signed)
Well controlled, no changes to meds. Encouraged heart healthy diet such as the DASH diet and exercise as tolerated.  °

## 2017-08-06 NOTE — Assessment & Plan Note (Signed)
hgba1c acceptable, minimize simple carbs. Increase exercise as tolerated. Continue current meds 

## 2017-08-06 NOTE — Progress Notes (Signed)
Subjective:  I acted as a Education administrator for Dr. [name]. [place], [place]  Patient ID: Phillip Doyle, male    DOB: 07/04/1956, 61 y.o.   MRN: [id number]  No chief complaint on file.   HPI  Patient is in today for a 6 month follow up and he reports generally he is feeling well.  He does acknowledge he has been having some flare in his back pain most notably on the right side for the last couple weeks.  It is slowly improving and he does believe struggling contributed to it.  No bladder or bowel incontinence no radicular symptoms.  He finds moist heat and Tylenol are somewhat helpful.  He denies polyuria or polydipsia.  He notes his insurance is not going to pay for Tradjenta this next year but they will pay for Januvia.  Sugars have generally been in the 130s and 140s Denies CP/palp/SOB/HA/congestion/fevers/GI or GU c/o. Taking meds as prescribed  Patient Care Team: Mosie Lukes, MD as PCP - General (Family Medicine)   Past Medical History:  Diagnosis Date  . Alcohol abuse 11/28/2013  . ALLERGIC RHINITIS 02/15/2007  . Allergy    rhinitis  . Bronchitis 07/14/2012  . Diabetes mellitus   . DM 10/24/2010  . ED (erectile dysfunction) 02/17/2012  . EXOGENOUS OBESITY 02/15/2007  . H/O alcohol abuse 11/28/2013  . H/O tobacco use, presenting hazards to health 07/04/2010   Qualifier: Diagnosis of  By: [name] MD, Erline Levine  1/2 ppd   . HTN (hypertension) 08/20/2011  . Hypertension   . HYPERTENSION 02/15/2007  . Impaired glucose tolerance   . 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 Low testosterone level in male 08/01/2016  . LUMBAR STRAIN 10/17/2010  . Mixed hyperlipidemia 07/04/2010  . NISSEN FUNDOPLICATION, HX OF 11/17/270  . OA (osteoarthritis) of knee 02/17/2012  . Obesity   . PEPTIC ULCER DISEASE 02/15/2007  . Personal history of colonic polyps 01/17/2013   colonoscoppy 2013, benign polyp advised repeat in 10 years  . Preventative health care 07/19/2012  . Restless sleeper 10/30/2016  . TOBACCO ABUSE 07/04/2010   . Ulcer 1982   peptic ulcer disease    Past Surgical History:  Procedure Laterality Date  . COLONOSCOPY  03/2012   Several hyperplastic polyps  . fundiplication for [facility] and reflux  1987  . negative stress test  2008  . ROTATOR CUFF REPAIR  2000  . VASECTOMY  1982    Family History  Problem Relation Age of Onset  . Asthma Mother   . Cancer Mother        uterine   . Alcohol abuse Father   . Stroke Maternal Grandmother        possibly  . Hypertension Maternal Grandfather   . Hypertension Brother   . Diabetes Brother   . Cancer Brother        prostate  . Colon cancer Neg Hx     Social History   Socioeconomic History  . Marital status: Married    Spouse name: [name]  . Number of children: Not on file  . Years of education: Not on file  . Highest education level: Not on file  Social Needs  . Financial resource strain: Not on file  . Food insecurity - worry: Not on file  . Food insecurity - inability: Not on file  . Transportation needs - medical: Not on file  . Transportation needs - non-medical: Not on file  Occupational History    Employer: [employer]  Use  . Smoking status: Former Smoker    Packs/day: 2.00    Years: 40.00    Pack years: 80.00    Types: Cigarettes    Last attempt to quit: 06/28/2014    Years since quitting: 3.1  . Smokeless tobacco: Former Systems developer    Quit date: 03/18/2012  . Tobacco comment: quit 06/18/10- started back   Substance and Sexual Activity  . Alcohol use: Yes    Alcohol/week: 0.0 oz    Types: 3 - 5 Cans of beer per week    Comment: 3-5 beer daily  . Drug use: No  . Sexual activity: Yes    Partners: Female  Other Topics Concern  . Not on file  Social History Narrative  . Not on file    Outpatient Medications Prior to Visit  Medication Sig Dispense Refill  . glimepiride (AMARYL) 1 MG tablet TAKE 1 TABLET BY MOUTH EVERY DAY WITH BREAKFAST 30 tablet 5  . glucose blood test strip Check blood sugar twice daily. DX: E11.9 100 each  6  . Lancets (FREESTYLE) lancets Check blood sugar twice daily.  DX E11.9 100 each 6  . lisinopril-hydrochlorothiazide (PRINZIDE,ZESTORETIC) 20-12.5 MG tablet Take 1 tablet 2 (two) times daily by mouth. 180 tablet 1  . metFORMIN (GLUCOPHAGE) 1000 MG tablet One tablet 2 times daily and half a tablet everyday at Noon 180 tablet 2  . metoprolol (TOPROL-XL) 200 MG 24 hr tablet Take 1 tablet (200 mg total) daily by mouth. 90 tablet 1  . Multiple Vitamins-Minerals (PA MENS 50 PLUS VITAPAK PO) Take 1 capsule by mouth daily.    Marland Kitchen NEEDLE, DISP, 22 G 22G X 1-1/2" MISC Inject 1 application into the muscle every 14 (fourteen) days. 100 each 1  . simvastatin (ZOCOR) 10 MG tablet TAKE 1 TABLET BY MOUTH DAILY AT 6 PM. 90 tablet 1  . vitamin B-12 (CYANOCOBALAMIN) 100 MCG tablet Take 100 mcg by mouth daily.    Marland Kitchen testosterone cypionate (DEPOTESTOSTERONE CYPIONATE) 200 MG/ML injection Inject 0.5 mLs (100 mg total) into the muscle once for 1 dose. 10 mL 0  . clindamycin (CLEOCIN) 300 MG capsule Take 1 capsule (300 mg total) by mouth 3 (three) times daily. Generic ok. 30 capsule 0  . metFORMIN (GLUCOPHAGE) 1000 MG tablet TAKE 1 TABLET BY MOUTH 2 TIMES DAILY WITH A MEAL. 180 tablet 2  . TRADJENTA 5 MG TABS tablet TAKE 1 TABLET BY MOUTH EVERY DAY 30 tablet 6  . testosterone enanthate (DELATESTRYL) injection 1,000 mg      No facility-administered medications prior to visit.     No Known Allergies  Review of Systems  Constitutional: Negative for fever and malaise/fatigue.  HENT: Negative for congestion.   Eyes: Negative for blurred vision.  Respiratory: Negative for shortness of breath.   Cardiovascular: Negative for chest pain, palpitations and leg swelling.  Gastrointestinal: Negative for abdominal pain, blood in stool and nausea.  Genitourinary: Negative for dysuria and frequency.  Musculoskeletal: Positive for back pain. Negative for falls.  Skin: Negative for rash.  Neurological: Negative for dizziness,  loss of consciousness and headaches.  Endo/Heme/Allergies: Negative for environmental allergies.  Psychiatric/Behavioral: Negative for depression. The patient is not nervous/anxious.        Objective:    Physical Exam  Constitutional: He is oriented to person, place, and time. He appears well-developed and well-nourished. No distress.  HENT:  Head: Normocephalic and atraumatic.  Nose: Nose normal.  Eyes: Right eye exhibits no discharge. Left eye exhibits no  discharge.  Neck: Normal range of motion. Neck supple.  Cardiovascular: Normal rate and regular rhythm.  No murmur heard. Pulmonary/Chest: Effort normal and breath sounds normal.  Abdominal: Soft. Bowel sounds are normal. There is no tenderness.  Musculoskeletal: He exhibits no edema.  Neurological: He is alert and oriented to person, place, and time.  Skin: Skin is warm and dry.  Psychiatric: He has a normal mood and affect.  Nursing note and vitals reviewed.   BP (!) 142/76 (BP Location: Left Arm, Patient Position: Sitting, Cuff Size: Normal)   Pulse 77   Temp 97.9 F (36.6 C) (Oral)   Resp 18   Wt 242 lb 9.6 oz (110 kg)   SpO2 96%   BMI 35.83 kg/m  Wt Readings from Last 3 Encounters:  08/06/17 242 lb 9.6 oz (110 kg)  05/06/17 231 lb 3.2 oz (104.9 kg)  02/03/17 240 lb 3.2 oz (109 kg)   BP Readings from Last 3 Encounters:  08/06/17 (!) 142/76  05/06/17 122/80  02/03/17 120/70     Immunization History  Administered Date(s) Administered  . Influenza Split 05/19/2011, 07/14/2012  . Influenza Whole 10/17/2010  . Influenza,inj,Quad PF,6+ Mos 05/24/2013, 05/29/2014, 05/31/2015, 04/15/2016  . Pneumococcal Conjugate-13 05/24/2013  . Pneumococcal Polysaccharide-23 05/31/2015  . Td 10/17/2010    Health Maintenance  Topic Date Due  . INFLUENZA VACCINE  03/18/2017  . FOOT EXAM  10/30/2017  . HEMOGLOBIN A1C  01/28/2018  . OPHTHALMOLOGY EXAM  05/07/2018  . PNEUMOCOCCAL POLYSACCHARIDE VACCINE (2) 05/30/2020  .  TETANUS/TDAP  10/16/2020  . COLONOSCOPY  04/12/2022  . Hepatitis C Screening  Completed  . HIV Screening  Completed    Lab Results  Component Value Date   WBC 7.0 07/30/2017   HGB 15.0 07/30/2017   HCT 44.0 07/30/2017   PLT 370.0 07/30/2017   GLUCOSE 109 (H) 07/30/2017   CHOL 122 07/30/2017   TRIG 125.0 07/30/2017   HDL 48.80 07/30/2017   LDLDIRECT 101.3 01/28/2008   LDLCALC 49 07/30/2017   ALT 23 07/30/2017   AST 14 07/30/2017   NA 129 (L) 07/30/2017   K 4.6 07/30/2017   CL 92 (L) 07/30/2017   CREATININE 0.78 07/30/2017   BUN 12 07/30/2017   CO2 29 07/30/2017   TSH 1.21 07/30/2017   PSA 0.27 07/30/2017   HGBA1C 6.0 07/30/2017   MICROALBUR 2.5 (H) 05/31/2015    Lab Results  Component Value Date   TSH 1.21 07/30/2017   Lab Results  Component Value Date   WBC 7.0 07/30/2017   HGB 15.0 07/30/2017   HCT 44.0 07/30/2017   MCV 93.7 07/30/2017   PLT 370.0 07/30/2017   Lab Results  Component Value Date   NA 129 (L) 07/30/2017   K 4.6 07/30/2017   CO2 29 07/30/2017   GLUCOSE 109 (H) 07/30/2017   BUN 12 07/30/2017   CREATININE 0.78 07/30/2017   BILITOT 0.6 07/30/2017   ALKPHOS 76 07/30/2017   AST 14 07/30/2017   ALT 23 07/30/2017   PROT 7.8 07/30/2017   ALBUMIN 4.9 07/30/2017   CALCIUM 9.7 07/30/2017   GFR 107.86 07/30/2017   Lab Results  Component Value Date   CHOL 122 07/30/2017   Lab Results  Component Value Date   HDL 48.80 07/30/2017   Lab Results  Component Value Date   LDLCALC 49 07/30/2017   Lab Results  Component Value Date   TRIG 125.0 07/30/2017   Lab Results  Component Value Date   CHOLHDL 3 07/30/2017  Lab Results  Component Value Date   HGBA1C 6.0 07/30/2017         Assessment & Plan:   Problem List Items Addressed This Visit    MIXED HYPERLIPIDEMIA    Tolerating statin, encouraged heart healthy diet, avoid trans fats, minimize simple carbs and saturated fats. Increase exercise as tolerated      Relevant Orders    Lipid panel   Obesity    Encouraged DASH diet, decrease po intake and increase exercise as tolerated. Needs 7-8 hours of sleep nightly. Avoid trans fats, eat small, frequent meals every 4-5 hours with lean proteins, complex carbs and healthy fats. Minimize simple carbs,       Relevant Medications   sitaGLIPtin (JANUVIA) 100 MG tablet   LUMBAR STRAIN    Right sided spasm lately. Encouraged moist heat and gentle stretching as tolerated. May try NSAIDs and prescription meds as directed and report if symptoms worsen or seek immediate care. improving      Diabetes mellitus type 2 in obese (HCC)    hgba1c acceptable, minimize simple carbs. Increase exercise as tolerated. Continue current meds      Relevant Medications   sitaGLIPtin (JANUVIA) 100 MG tablet   HTN (hypertension)    Well controlled, no changes to meds. Encouraged heart healthy diet such as the DASH diet and exercise as tolerated.       Relevant Orders   CBC   Comprehensive metabolic panel   TSH   Preventative health care - Primary   Relevant Orders   PSA   Low testosterone in male    Tolerating supplements.        Other Visit Diagnoses    Type 2 diabetes mellitus with complication, without long-term current use of insulin (HCC)       Relevant Medications   sitaGLIPtin (JANUVIA) 100 MG tablet   Other Relevant Orders   Hemoglobin A1c      I have discontinued Chevez A. Gabrielsen's clindamycin and TRADJENTA. I am also having him start on sitaGLIPtin. Additionally, I am having him maintain his Multiple Vitamins-Minerals (PA MENS 50 PLUS VITAPAK PO), vitamin B-12, glucose blood, freestyle, metFORMIN, NEEDLE (DISP) 22 G, glimepiride, simvastatin, lisinopril-hydrochlorothiazide, metoprolol, and testosterone cypionate. We will stop administering testosterone enanthate.  Meds ordered this encounter  Medications  . sitaGLIPtin (JANUVIA) 100 MG tablet    Sig: Take 1 tablet (100 mg total) by mouth daily.    Dispense:  30 tablet     Refill:  5    CMA served as scribe during this visit. History, Physical and Plan performed by medical provider. Documentation and orders reviewed and attested to.  Penni Homans, MD

## 2017-08-06 NOTE — Assessment & Plan Note (Signed)
Encouraged DASH diet, decrease po intake and increase exercise as tolerated. Needs 7-8 hours of sleep nightly. Avoid trans fats, eat small, frequent meals every 4-5 hours with lean proteins, complex carbs and healthy fats. Minimize simple carbs, 

## 2017-08-06 NOTE — Patient Instructions (Signed)

## 2017-08-06 NOTE — Assessment & Plan Note (Signed)
Right sided spasm lately. Encouraged moist heat and gentle stretching as tolerated. May try NSAIDs and prescription meds as directed and report if symptoms worsen or seek immediate care. improving

## 2017-08-06 NOTE — Assessment & Plan Note (Signed)
Tolerating statin, encouraged heart healthy diet, avoid trans fats, minimize simple carbs and saturated fats. Increase exercise as tolerated 

## 2017-08-12 ENCOUNTER — Telehealth: Payer: Self-pay

## 2017-08-12 NOTE — Telephone Encounter (Signed)
PA initiated via Covermymeds; KEY: [id number]. Received PA approval effective 08/12/2017 through 08/12/2020.

## 2017-08-13 ENCOUNTER — Ambulatory Visit (INDEPENDENT_AMBULATORY_CARE_PROVIDER_SITE_OTHER): Payer: BC Managed Care – PPO | Admitting: Internal Medicine

## 2017-08-13 ENCOUNTER — Encounter: Payer: Self-pay | Admitting: Internal Medicine

## 2017-08-13 VITALS — BP 134/78 | HR 83 | Ht 69.0 in | Wt 240.0 lb

## 2017-08-13 DIAGNOSIS — R0683 Snoring: Secondary | ICD-10-CM

## 2017-08-13 DIAGNOSIS — J449 Chronic obstructive pulmonary disease, unspecified: Secondary | ICD-10-CM

## 2017-08-13 MED ORDER — UMECLIDINIUM-VILANTEROL 62.5-25 MCG/INH IN AEPB: 1.0000 | INHALATION_SPRAY | Freq: Every day | RESPIRATORY_TRACT | 12 refills | Status: DC

## 2017-08-13 MED ORDER — UMECLIDINIUM-VILANTEROL 62.5-25 MCG/INH IN AEPB: 1.0000 | INHALATION_SPRAY | Freq: Every day | RESPIRATORY_TRACT | 0 refills | Status: DC

## 2017-08-13 NOTE — Patient Instructions (Signed)
Sample and printed script for Anoro inhaler   Inhale 1 puff, once daily. If it seems to help, you can get the script filled  For snoring, we mentioned sleeping off the flat of your back, or trying one of the over-the-counter mouth pieces or chin straps made for snoring.  Please call if we can help

## 2017-08-13 NOTE — Progress Notes (Signed)
HPI male former smoker followed for snoring complicated by  allergic rhinitis, DM 2, HBP, GERD/Nissen NPSG 07/26/06- AHI  16/ hr, desaturation to 81%, body weight  250 lbs HST 05/13/17-AHI 3.3/hour, desaturation to 81% with average 93%, body weight 231 pounds PFT 06/11/17-moderate obstructive airways disease with response to bronchodilator, mild restriction.  FVC 3.26/73%, FEV1 2.23/66%, ratio 1.68, TLC 74%, DLCO 108%  --------------------------------------------------------------------------- 05/06/17- 61 year old male former smoker for sleep evaluation Medical problem list includes allergic rhinitis, DM 2, HBP, GERD/Nissen,  Not diagnosed with COPD but smoked 2 packs per day 40 years. Pt gets up once or twice each night to use the restroom, goes to sleep 9:30pm wakes up  4:30am daily. Pt states he snores, and restless sleeping. Had sleep study completed [place] 11 years ago. Pt referred by Dr. Penni Homans. NPSG 07/26/06- AHI  16/ hr, desaturation to 81%, body weight  250 lbs Never treated after  original sleep study. Wife tells him he snores loudly, tosses and turns. No complex parasomnias reported. Occasional nap on weekends. No sleep medicines. 2 cups of morning coffee. No ENT surgery. Upper denture plate. Epworth score 6/24  08/13/17-  61 year old male former smoker followed for snoring complicated by COPD, allergic rhinitis, DM 2, HBP, GERD/Nissen    follow up for OSA, PFT 06/11/17,home sleep study 9/18, spouse complains that he snores HST 05/13/17-AHI 3.3/hour, desaturation to 81% with average 93%, body weight 231 pounds PFT 06/11/17-moderate obstructive airways disease with response to bronchodilator, mild restriction.  FVC 3.26/73%, FEV1 2.23/66%, ratio 1.68, TLC 74%, DLCO 108% He was pleased that he no longer has OSA, possibly because of weight loss since his original study.  We discussed management of snoring and reviewed his PFT.  ROS-see HPI   + = Positive Constitutional:     weight loss, night sweats, fevers, chills, fatigue, lassitude. HEENT:    headaches, difficulty swallowing, +tooth/dental problems, sore throat,       sneezing, itching, ear ache, nasal congestion, post nasal drip, snoring CV:    chest pain, orthopnea, PND, swelling in lower extremities, anasarca,                                                      dizziness, palpitations Resp:   shortness of breath with exertion or at rest.                productive cough,   non-productive cough, coughing up of blood.              change in color of mucus.  wheezing.   Skin:    rash or lesions. GI: +heartburn, indigestion, abdominal pain, nausea, vomiting, diarrhea,                 change in bowel habits, loss of appetite GU: dysuria, change in color of urine, no urgency or frequency.   flank pain. MS:   joint pain, stiffness, decreased range of motion, back pain. Neuro-     nothing unusual Psych:  change in mood or affect.  depression or anxiety.   memory loss.  OBJ- Physical Exam General- Alert, Oriented, Affect-appropriate, Distress- none acute Skin- rash-none, lesions- none, excoriation- none Lymphadenopathy- none Head- atraumatic            Eyes- Gross vision intact, PERRLA, conjunctivae and secretions clear  Ears- Hearing, canals-normal            Nose- Clear, no-Septal dev, mucus, polyps, erosion, perforation             Throat- Mallampati IV , mucosa clear , drainage- none, tonsils- atrophic, + upper denture plate Neck- flexible , trachea midline, no stridor , thyroid nl, carotid no bruit Chest - symmetrical excursion , unlabored           Heart/CV- RRR , no murmur , no gallop  , no rub, nl s1 s2                           - JVD- none , edema- none, stasis changes- none, varices- none           Lung- clear to P&A, wheeze- none, cough- none , dullness-none, rub- none           Chest wall-  Abd-  Br/ Gen/ Rectal- Not done, not indicated Extrem- cyanosis- none, clubbing, none, atrophy-  none, strength- nl Neuro- grossly intact to observation

## 2017-08-15 DIAGNOSIS — J449 Chronic obstructive pulmonary disease, unspecified: Secondary | ICD-10-CM | POA: Insufficient documentation

## 2017-08-15 NOTE — Assessment & Plan Note (Signed)
We discussed importance of keeping his weight down, sleeping off a lot of back, and managing nasal congestion.

## 2017-08-15 NOTE — Assessment & Plan Note (Signed)
PFT demonstrates moderate airway obstruction with some response to bronchodilator. Plan- Try Jearl Klinefelter

## 2017-09-07 ENCOUNTER — Other Ambulatory Visit: Payer: Self-pay | Admitting: Family Medicine

## 2017-11-26 ENCOUNTER — Other Ambulatory Visit: Payer: Self-pay

## 2017-11-26 MED ORDER — METOPROLOL SUCCINATE ER 200 MG PO TB24: 200.0000 mg | ORAL_TABLET | Freq: Every day | ORAL | 0 refills | Status: DC

## 2017-11-26 NOTE — Telephone Encounter (Signed)
Received request for patient's metformin hcl. Request has been refilled and sent to pharmacy.

## 2017-11-27 ENCOUNTER — Other Ambulatory Visit: Payer: Self-pay | Admitting: Family Medicine

## 2017-12-05 ENCOUNTER — Other Ambulatory Visit: Payer: Self-pay | Admitting: Family Medicine

## 2017-12-14 ENCOUNTER — Ambulatory Visit (INDEPENDENT_AMBULATORY_CARE_PROVIDER_SITE_OTHER): Payer: BC Managed Care – PPO | Admitting: Internal Medicine

## 2017-12-14 ENCOUNTER — Encounter: Payer: Self-pay | Admitting: Internal Medicine

## 2017-12-14 ENCOUNTER — Ambulatory Visit (INDEPENDENT_AMBULATORY_CARE_PROVIDER_SITE_OTHER)
Admission: RE | Admit: 2017-12-14 | Discharge: 2017-12-14 | Disposition: A | Discharge status: Home / Self Care | Payer: BC Managed Care – PPO | Source: Ambulatory Visit | Attending: Internal Medicine | Admitting: Internal Medicine

## 2017-12-14 VITALS — BP 120/82 | HR 82 | Ht 69.0 in | Wt 251.6 lb

## 2017-12-14 DIAGNOSIS — J449 Chronic obstructive pulmonary disease, unspecified: Secondary | ICD-10-CM

## 2017-12-14 DIAGNOSIS — R0683 Snoring: Secondary | ICD-10-CM

## 2017-12-14 MED ORDER — ALBUTEROL SULFATE HFA 108 (90 BASE) MCG/ACT IN AERS: 2.0000 | INHALATION_SPRAY | Freq: Four times a day (QID) | RESPIRATORY_TRACT | 12 refills | Status: DC | PRN

## 2017-12-14 NOTE — Assessment & Plan Note (Signed)
Encouraged to keep his weight down, sleep off flat of back

## 2017-12-14 NOTE — Progress Notes (Signed)
HPI male former smoker followed for snoring complicated by  allergic rhinitis, DM 2, HBP, GERD/Nissen NPSG 07/26/06- AHI  16/ hr, desaturation to 81%, body weight  250 lbs HST 05/13/17-AHI 3.3/hour, desaturation to 81% with average 93%, body weight 231 pounds PFT 06/11/17-moderate obstructive airways disease with response to bronchodilator, mild restriction.  FVC 3.26/73%, FEV1 2.23/66%, ratio 1.68, TLC 74%, DLCO 108%  --------------------------------------------------------------------------- 08/13/17-  62 year old male former smoker followed for snoring complicated by COPD, allergic rhinitis, DM 2, HBP, GERD/Nissen    follow up for OSA, PFT 06/11/17,home sleep study 9/18, spouse complains that he snores HST 05/13/17-AHI 3.3/hour, desaturation to 81% with average 93%, body weight 231 pounds PFT 06/11/17-moderate obstructive airways disease with response to bronchodilator, mild restriction.  FVC 3.26/73%, FEV1 2.23/66%, ratio 1.68, TLC 74%, DLCO 108% He was pleased that he no longer has OSA, possibly because of weight loss since his original study.  We discussed management of snoring and reviewed his PFT.  12/14/2017- 62 year old male former smoker followed for COPD, complicated by snoring , allergic rhinitis, DM 2, HBP, GERD/Nissen ----4 month follow up for COPD. Patient states he has been feeling great since last visit. Denies any new concerns.  Anoro,  Anoro helps-he can tell he takes deeper breaths.  Some cough occasionally, dry, blamed on pollen.  Does not have a rescue inhaler. We discussed snoring control measures again including earplugs for his wife if these would help.  ROS-see HPI   + = Positive Constitutional:    weight loss, night sweats, fevers, chills, fatigue, lassitude. HEENT:    headaches, difficulty swallowing, +tooth/dental problems, sore throat,       sneezing, itching, ear ache, nasal congestion, post nasal drip, snoring CV:    chest pain, orthopnea, PND, swelling in lower  extremities, anasarca,                                                      dizziness, palpitations Resp:   shortness of breath with exertion or at rest.                productive cough,   +non-productive cough, coughing up of blood.              change in color of mucus.  wheezing.   Skin:    rash or lesions. GI: +heartburn, indigestion, abdominal pain, nausea, vomiting, diarrhea,                 change in bowel habits, loss of appetite GU: dysuria, change in color of urine, no urgency or frequency.   flank pain. MS:   joint pain, stiffness, decreased range of motion, back pain. Neuro-     nothing unusual Psych:  change in mood or affect.  depression or anxiety.   memory loss.  OBJ- Physical Exam General- Alert, Oriented, Affect-appropriate, Distress- none acute Skin- rash-none, lesions- none, excoriation- none Lymphadenopathy- none Head- atraumatic            Eyes- Gross vision intact, PERRLA, conjunctivae and secretions clear            Ears- Hearing, canals-normal            Nose- Clear, no-Septal dev, mucus, polyps, erosion, perforation             Throat- Mallampati IV , mucosa clear , drainage-  none, tonsils- atrophic, + upper denture plate Neck- flexible , trachea midline, no stridor , thyroid nl, carotid no bruit Chest - symmetrical excursion , unlabored           Heart/CV- RRR , no murmur , no gallop  , no rub, nl s1 s2                           - JVD- none , edema- none, stasis changes- none, varices- none           Lung- clear to P&A, wheeze- none, cough- none , dullness-none, rub- none           Chest wall-  Abd-  Br/ Gen/ Rectal- Not done, not indicated Extrem- cyanosis- none, clubbing, none, atrophy- none, strength- nl Neuro- grossly intact to observation

## 2017-12-14 NOTE — Patient Instructions (Signed)
Script printed to hold for albuterol rescue inhaler to use if you need more help some days than just the Anoro  Order CXR    Dx COPD mixed type  Remember to keep your weight down and sleep of your back to help with snoring.  Please call if you need [place]

## 2017-12-14 NOTE — Assessment & Plan Note (Signed)
Stable with good symptom control.  I suggested he keep a rescue inhaler available. Plan-continue Anoro, refill albuterol rescue inhaler, CXR

## 2017-12-31 ENCOUNTER — Other Ambulatory Visit: Payer: Self-pay | Admitting: Family Medicine

## 2018-01-08 ENCOUNTER — Other Ambulatory Visit: Payer: Self-pay | Admitting: Family Medicine

## 2018-02-04 ENCOUNTER — Other Ambulatory Visit: Payer: Self-pay | Admitting: Family Medicine

## 2018-02-04 ENCOUNTER — Telehealth: Payer: Self-pay | Admitting: Family Medicine

## 2018-02-04 ENCOUNTER — Other Ambulatory Visit (INDEPENDENT_AMBULATORY_CARE_PROVIDER_SITE_OTHER): Payer: BC Managed Care – PPO

## 2018-02-04 ENCOUNTER — Telehealth: Payer: Self-pay | Admitting: Emergency Medicine

## 2018-02-04 DIAGNOSIS — Z0000 Encounter for general adult medical examination without abnormal findings: Secondary | ICD-10-CM | POA: Diagnosis not present

## 2018-02-04 DIAGNOSIS — E118 Type 2 diabetes mellitus with unspecified complications: Secondary | ICD-10-CM

## 2018-02-04 DIAGNOSIS — E782 Mixed hyperlipidemia: Secondary | ICD-10-CM

## 2018-02-04 DIAGNOSIS — I10 Essential (primary) hypertension: Secondary | ICD-10-CM

## 2018-02-04 DIAGNOSIS — E871 Hypo-osmolality and hyponatremia: Secondary | ICD-10-CM

## 2018-02-04 LAB — COMPREHENSIVE METABOLIC PANEL
ALBUMIN: 4.9 g/dL (ref 3.5–5.2)
ALK PHOS: 54 U/L (ref 39–117)
ALT: 52 U/L (ref 0–53)
AST: 39 U/L — ABNORMAL HIGH (ref 0–37)
BILIRUBIN TOTAL: 1 mg/dL (ref 0.2–1.2)
BUN: 8 mg/dL (ref 6–23)
CALCIUM: 9.7 mg/dL (ref 8.4–10.5)
CO2: 24 mEq/L (ref 19–32)
CREATININE: 0.79 mg/dL (ref 0.40–1.50)
Chloride: 80 mEq/L — ABNORMAL LOW (ref 96–112)
GFR: 106.1 mL/min (ref >60.00)
Glucose, Bld: 128 mg/dL — ABNORMAL HIGH (ref 70–99)
Potassium: 4 mEq/L (ref 3.5–5.1)
Sodium: 116 mEq/L — CL (ref 135–145)
Total Protein: 7.4 g/dL (ref 6.0–8.3)

## 2018-02-04 LAB — CBC
HEMATOCRIT: 36.6 % — AB (ref 39.0–52.0)
Hemoglobin: 13.1 g/dL (ref 13.0–17.0)
MCHC: 35.9 g/dL (ref 30.0–36.0)
MCV: 93.4 fl (ref 78.0–100.0)
Platelets: 375 10*3/uL (ref 150.0–400.0)
RBC: 3.93 Mil/uL — ABNORMAL LOW (ref 4.22–5.81)
RDW: 12.6 % (ref 11.5–15.5)
WBC: 6.4 10*3/uL (ref 4.0–10.5)

## 2018-02-04 LAB — LIPID PANEL
CHOL/HDL RATIO: 2
CHOLESTEROL: 104 mg/dL (ref 0–200)
HDL: 42.3 mg/dL (ref >39.00)
LDL Cholesterol: 49 mg/dL (ref 0–99)
NonHDL: 61.8
TRIGLYCERIDES: 65 mg/dL (ref 0.0–149.0)
VLDL: 13 mg/dL (ref 0.0–40.0)

## 2018-02-04 LAB — PSA: PSA: 0.76 ng/mL (ref 0.10–4.00)

## 2018-02-04 LAB — HEMOGLOBIN A1C: HEMOGLOBIN A1C: 6 % (ref 4.6–6.5)

## 2018-02-04 LAB — TSH: TSH: 1.21 u[IU]/mL (ref 0.35–4.50)

## 2018-02-04 MED ORDER — LISINOPRIL 20 MG PO TABS: 20.0000 mg | ORAL_TABLET | Freq: Two times a day (BID) | ORAL | 3 refills | Status: DC

## 2018-02-04 NOTE — Addendum Note (Signed)
Addended by: Magdalene Molly A on: 02/04/2018 02:55 PM   Modules accepted: Orders

## 2018-02-04 NOTE — Telephone Encounter (Signed)
"  CRITICAL VALUE STICKER  CRITICAL VALUE:Na 116  RECEIVER (on-site recipient of call):Jamile Rekowski P.  DATE & TIME NOTIFIED: 02-04-18 1441  MESSENGER (representative from lab):Ilean China  MD NOTIFIED: [name]  TIME OF NOTIFICATION:1445  RESPONSE:

## 2018-02-04 NOTE — Telephone Encounter (Signed)
Copied from [place] [id number]. Topic: Quick Communication - See Telephone Encounter >> Feb 04, 2018  3:11 PM Mylinda Latina, NT wrote: CRM for notification. See Telephone encounter for: 02/04/18.  Patient states he is returning a missed call in reference to lab results  CB# [phone]

## 2018-02-04 NOTE — Telephone Encounter (Signed)
Spoke with patients wife unable to get patient on the phone. Per Dr. [name] he needed to cut his water intake completely and repeat lab work on tomorrow. Wife stated he is working outside and she was going to relay the message.

## 2018-02-05 ENCOUNTER — Other Ambulatory Visit: Payer: BC Managed Care – PPO

## 2018-02-08 ENCOUNTER — Telehealth: Payer: Self-pay | Admitting: *Deleted

## 2018-02-08 ENCOUNTER — Ambulatory Visit (INDEPENDENT_AMBULATORY_CARE_PROVIDER_SITE_OTHER): Payer: BC Managed Care – PPO | Admitting: Family Medicine

## 2018-02-08 ENCOUNTER — Encounter: Payer: Self-pay | Admitting: Family Medicine

## 2018-02-08 VITALS — BP 134/68 | HR 76 | Temp 98.5°F | Resp 18 | Ht 69.0 in | Wt 244.0 lb

## 2018-02-08 DIAGNOSIS — E1169 Type 2 diabetes mellitus with other specified complication: Secondary | ICD-10-CM | POA: Diagnosis not present

## 2018-02-08 DIAGNOSIS — I10 Essential (primary) hypertension: Secondary | ICD-10-CM | POA: Diagnosis not present

## 2018-02-08 DIAGNOSIS — R972 Elevated prostate specific antigen [PSA]: Secondary | ICD-10-CM

## 2018-02-08 DIAGNOSIS — F1011 Alcohol abuse, in remission: Secondary | ICD-10-CM

## 2018-02-08 DIAGNOSIS — E782 Mixed hyperlipidemia: Secondary | ICD-10-CM | POA: Diagnosis not present

## 2018-02-08 DIAGNOSIS — J449 Chronic obstructive pulmonary disease, unspecified: Secondary | ICD-10-CM

## 2018-02-08 DIAGNOSIS — R7989 Other specified abnormal findings of blood chemistry: Secondary | ICD-10-CM

## 2018-02-08 DIAGNOSIS — E669 Obesity, unspecified: Secondary | ICD-10-CM | POA: Diagnosis not present

## 2018-02-08 DIAGNOSIS — E871 Hypo-osmolality and hyponatremia: Secondary | ICD-10-CM

## 2018-02-08 DIAGNOSIS — Z0000 Encounter for general adult medical examination without abnormal findings: Secondary | ICD-10-CM

## 2018-02-08 DIAGNOSIS — E6609 Other obesity due to excess calories: Secondary | ICD-10-CM

## 2018-02-08 LAB — COMPREHENSIVE METABOLIC PANEL
ALBUMIN: 4.7 g/dL (ref 3.5–5.2)
ALK PHOS: 63 U/L (ref 39–117)
ALT: 55 U/L — ABNORMAL HIGH (ref 0–53)
AST: 27 U/L (ref 0–37)
BUN: 8 mg/dL (ref 6–23)
CO2: 24 mEq/L (ref 19–32)
Calcium: 9.3 mg/dL (ref 8.4–10.5)
Chloride: 78 mEq/L — ABNORMAL LOW (ref 96–112)
Creatinine, Ser: 0.9 mg/dL (ref 0.40–1.50)
GFR: 91.27 mL/min (ref >60.00)
GLUCOSE: 120 mg/dL — AB (ref 70–99)
POTASSIUM: 3.9 meq/L (ref 3.5–5.1)
Sodium: 111 mEq/L — CL (ref 135–145)
TOTAL PROTEIN: 7.1 g/dL (ref 6.0–8.3)
Total Bilirubin: 0.5 mg/dL (ref 0.2–1.2)

## 2018-02-08 LAB — TESTOSTERONE: Testosterone: 202.01 ng/dL — ABNORMAL LOW (ref 300.00–890.00)

## 2018-02-08 NOTE — Assessment & Plan Note (Signed)
No recent flares 

## 2018-02-08 NOTE — Telephone Encounter (Signed)
CRITICAL VALUE STICKER  CRITICAL VALUE:Sodium:111  RECEIVER (on-site recipient of call):Angie  DATE & TIME NOTIFIED:02/08/18 @ 1:06pm  MESSENGER (representative from lab):Saa-Elam  MD NOTIFIED:Dr. [name]   TIME OF NOTIFICATION:1:09pm  RESPONSE:Asked to send message to her with the results.//AB/CMA

## 2018-02-08 NOTE — Assessment & Plan Note (Signed)
Tolerating statin, encouraged heart healthy diet, avoid trans fats, minimize simple carbs and saturated fats. Increase exercise as tolerated 

## 2018-02-08 NOTE — Assessment & Plan Note (Signed)
hgba1c acceptable, minimize simple carbs. Increase exercise as tolerated. Continue current meds 

## 2018-02-08 NOTE — Progress Notes (Signed)
Subjective:  I acted as a Education administrator for Dr. Charlett Doyle. [place], [place]  Patient ID: Phillip Doyle, male    DOB: [birth year], 62 y.o.   MRN: [id number]  Chief Complaint  Patient presents with  . Annual Exam    HPI  Patient is in today for an annual exam. He is following up on his HTN, hyperlipidemia, DM and other medical concerns. Patiens has no acute concerns.  No recent febrile illness or acute hospitalizations. Denies CP/palp/SOB/HA/congestion/fevers/GI or GU c/o. Taking meds as prescribed . Last week when he did his lab work his sodium was down so he cut down on his excessive amount of water intake as he moved his family home in the heat. He has drunk alcohol to excess in past but has cut down significantly to sometimes none and up to 4 beers a day. He is managing his ADLs well and stays active. He continues to manages to maintain a diabetic diet most days.  Patient Care Team: Phillip Lukes, MD as PCP - General (Family Medicine)   Past Medical History:  Diagnosis Date  . Alcohol abuse 11/28/2013  . ALLERGIC RHINITIS 02/15/2007  . Allergy    rhinitis  . Bronchitis 07/14/2012  . Diabetes mellitus   . DM 10/24/2010  . ED (erectile dysfunction) 02/17/2012  . EXOGENOUS OBESITY 02/15/2007  . H/O alcohol abuse 11/28/2013  . H/O tobacco use, presenting hazards to health 07/04/2010   Qualifier: Diagnosis of  By: Phillip Blake MD, Phillip Doyle  1/2 ppd   . HTN (hypertension) 08/20/2011  . Hypertension   . HYPERTENSION 02/15/2007  . Impaired glucose tolerance   . 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 Low testosterone level in male 08/01/2016  . LUMBAR STRAIN 10/17/2010  . Mixed hyperlipidemia 07/04/2010  . NISSEN FUNDOPLICATION, HX OF 09/19/5425  . OA (osteoarthritis) of knee 02/17/2012  . Obesity   . PEPTIC ULCER DISEASE 02/15/2007  . Personal history of colonic polyps 01/17/2013   colonoscoppy 2013, benign polyp advised repeat in 10 years  . Preventative health care 07/19/2012  . Restless sleeper 10/30/2016  .  TOBACCO ABUSE 07/04/2010  . Ulcer 1982   peptic ulcer disease    Past Surgical History:  Procedure Laterality Date  . COLONOSCOPY  03/2012   Several hyperplastic polyps  . fundiplication for [facility] and reflux  1987  . negative stress test  2008  . ROTATOR CUFF REPAIR  2000  . VASECTOMY  1982    Family History  Problem Relation Age of Onset  . Asthma Mother   . Cancer Mother        uterine   . Alcohol abuse Father   . Stroke Maternal Grandmother        possibly  . Hypertension Maternal Grandfather   . Hypertension Brother   . Diabetes Brother   . Heart disease Brother        aortic valve replaced, CAD  . Cancer Brother        prostate  . Colon cancer Neg Hx     Social History   Socioeconomic History  . Marital status: Married    Spouse name: Phillip Doyle  . Number of children: Not on file  . Years of education: Not on file  . Highest education level: Not on file  Occupational History    Employer: [employer]  . Financial resource strain: Not on file  . Food insecurity:    Worry: Not on file    Inability: Not on file  .  Transportation needs:    Medical: Not on file    Non-medical: Not on file  Tobacco Use  . Smoking status: Former Smoker    Packs/day: 2.00    Years: 40.00    Pack years: 80.00    Types: Cigarettes    Last attempt to quit: 06/28/2014    Years since quitting: 3.6  . Smokeless tobacco: Former Systems developer    Quit date: 03/18/2012  . Tobacco comment: quit 06/18/10- started back   Substance and Sexual Activity  . Alcohol use: Yes    Alcohol/week: 1.8 - 2.4 oz    Types: 3 - 4 Cans of beer per week    Comment: 3-4 every other day  . Drug use: No  . Sexual activity: Yes    Partners: Female  Lifestyle  . Physical activity:    Days per week: Not on file    Minutes per session: Not on file  . Stress: Not on file  Relationships  . Social connections:    Talks on phone: Not on file    Gets together: Not on file    Attends religious service: Not on file      Active member of club or organization: Not on file    Attends meetings of clubs or organizations: Not on file    Relationship status: Not on file  . Intimate partner violence:    Fear of current or ex partner: Not on file    Emotionally abused: Not on file    Physically abused: Not on file    Forced sexual activity: Not on file  Other Topics Concern  . Not on file  Social History Narrative  . Not on file    Outpatient Medications Prior to Visit  Medication Sig Dispense Refill  . albuterol (PROVENTIL HFA;VENTOLIN HFA) 108 (90 Base) MCG/ACT inhaler Inhale 2 puffs into the lungs every 6 (six) hours as needed for wheezing or shortness of breath. 1 Inhaler 12  . glimepiride (AMARYL) 1 MG tablet TAKE 1 TABLET BY MOUTH EVERY DAY WITH BREAKFAST 30 tablet 3  . glucose blood test strip Check blood sugar twice daily. DX: E11.9 100 each 6  . Lancets (FREESTYLE) lancets Check blood sugar twice daily.  DX E11.9 100 each 6  . lisinopril (PRINIVIL,ZESTRIL) 20 MG tablet Take 1 tablet (20 mg total) by mouth 2 (two) times daily. 60 tablet 3  . metFORMIN (GLUCOPHAGE) 1000 MG tablet TAKE 1 TABLET BY MOUTH 2 TIMES DAILY WITH A MEAL. 180 tablet 2  . metoprolol (TOPROL-XL) 200 MG 24 hr tablet Take 1 tablet (200 mg total) by mouth daily. 90 tablet 0  . Multiple Vitamins-Minerals (PA MENS 50 PLUS VITAPAK PO) Take 1 capsule by mouth daily.    Marland Kitchen NEEDLE, DISP, 22 G 22G X 1-1/2" MISC Inject 1 application into the muscle every 14 (fourteen) days. 100 each 1  . simvastatin (ZOCOR) 10 MG tablet TAKE 1 TABLET BY MOUTH EVERY DAY AT 6PM 90 tablet 1  . umeclidinium-vilanterol (ANORO ELLIPTA) 62.5-25 MCG/INH AEPB Inhale 1 puff into the lungs daily. 60 each 12  . vitamin B-12 (CYANOCOBALAMIN) 100 MCG tablet Take 100 mcg by mouth daily.    . metoprolol (TOPROL-XL) 200 MG 24 hr tablet TAKE 1 TABLET BY MOUTH DAILY (Patient not taking: Reported on 02/09/2018) 90 tablet 1  . sitaGLIPtin (JANUVIA) 100 MG tablet Take 1 tablet  (100 mg total) by mouth daily. 30 tablet 5   No facility-administered medications prior to visit.  No Known Allergies  Review of Systems  Constitutional: Negative for fever and malaise/fatigue.  HENT: Negative for congestion.   Eyes: Negative for blurred vision.  Respiratory: Negative for cough and shortness of breath.   Cardiovascular: Negative for chest pain, palpitations and leg swelling.  Gastrointestinal: Negative for vomiting.  Musculoskeletal: Negative for back pain.  Skin: Negative for rash.  Neurological: Negative for loss of consciousness and headaches.       Objective:    Physical Exam  Constitutional: He is oriented to person, place, and time. He appears well-developed and well-nourished. No distress.  HENT:  Head: Normocephalic and atraumatic.  Eyes: Conjunctivae are normal.  Neck: Normal range of motion. No thyromegaly present.  Cardiovascular: Normal rate and regular rhythm.  Pulmonary/Chest: Effort normal and breath sounds normal. He has no wheezes.  Abdominal: Soft. Bowel sounds are normal. There is no tenderness.  Musculoskeletal: Normal range of motion. He exhibits no edema or deformity.  Neurological: He is alert and oriented to person, place, and time.  Skin: Skin is warm and dry. He is not diaphoretic.  Psychiatric: He has a normal mood and affect.    BP 134/68 (BP Location: Left Arm, Patient Position: Sitting, Cuff Size: Normal)   Pulse 76   Temp 98.5 F (36.9 C) (Oral)   Resp 18   Ht 5\' 9"  (1.753 m)   Wt 244 lb (110.7 kg)   SpO2 99%   BMI 36.03 kg/m  Wt Readings from Last 3 Encounters:  02/10/18 235 lb 14.4 oz (107 kg)  02/08/18 244 lb (110.7 kg)  12/14/17 251 lb 9.6 oz (114.1 kg)   BP Readings from Last 3 Encounters:  02/10/18 132/82  02/08/18 134/68  12/14/17 120/82     Immunization History  Administered Date(s) Administered  . Influenza Split 05/19/2011, 07/14/2012, 08/06/2017  . Influenza Whole 10/17/2010  .  Influenza,inj,Quad PF,6+ Mos 05/24/2013, 05/29/2014, 05/31/2015, 04/15/2016  . Pneumococcal Conjugate-13 05/24/2013  . Pneumococcal Polysaccharide-23 05/31/2015  . Td 10/17/2010    Health Maintenance  Topic Date Due  . FOOT EXAM  10/30/2017  . INFLUENZA VACCINE  03/18/2018  . OPHTHALMOLOGY EXAM  05/07/2018  . HEMOGLOBIN A1C  08/11/2018  . PNEUMOCOCCAL POLYSACCHARIDE VACCINE (2) 05/30/2020  . TETANUS/TDAP  10/16/2020  . COLONOSCOPY  04/12/2022  . Hepatitis C Screening  Completed  . HIV Screening  Completed    Lab Results  Component Value Date   WBC 5.2 02/10/2018   HGB 12.2 (L) 02/10/2018   HCT 33.9 (L) 02/10/2018   PLT 336 02/10/2018   GLUCOSE 159 (H) 02/10/2018   CHOL 85 02/09/2018   TRIG 72 02/09/2018   HDL 36 (L) 02/09/2018   LDLDIRECT 101.3 01/28/2008   LDLCALC 35 02/09/2018   ALT 53 (H) 02/10/2018   AST 25 02/10/2018   NA 128 (L) 02/10/2018   K 4.5 02/10/2018   CL 93 (L) 02/10/2018   CREATININE 0.79 02/10/2018   BUN 7 02/10/2018   CO2 29 02/10/2018   TSH 1.275 02/09/2018   PSA 0.76 02/04/2018   HGBA1C 5.8 (H) 02/09/2018   MICROALBUR 2.5 (H) 05/31/2015    Lab Results  Component Value Date   TSH 1.275 02/09/2018   Lab Results  Component Value Date   WBC 5.2 02/10/2018   HGB 12.2 (L) 02/10/2018   HCT 33.9 (L) 02/10/2018   MCV 91.1 02/10/2018   PLT 336 02/10/2018   Lab Results  Component Value Date   NA 128 (L) 02/10/2018   K 4.5 02/10/2018  CO2 29 02/10/2018   GLUCOSE 159 (H) 02/10/2018   BUN 7 02/10/2018   CREATININE 0.79 02/10/2018   BILITOT 0.6 02/10/2018   ALKPHOS 45 02/10/2018   AST 25 02/10/2018   ALT 53 (H) 02/10/2018   PROT 6.7 02/10/2018   ALBUMIN 4.1 02/10/2018   CALCIUM 8.8 (L) 02/10/2018   ANIONGAP 6 02/10/2018   GFR 91.27 02/08/2018   Lab Results  Component Value Date   CHOL 85 02/09/2018   Lab Results  Component Value Date   HDL 36 (L) 02/09/2018   Lab Results  Component Value Date   LDLCALC 35 02/09/2018   Lab  Results  Component Value Date   TRIG 72 02/09/2018   Lab Results  Component Value Date   CHOLHDL 2.4 02/09/2018   Lab Results  Component Value Date   HGBA1C 5.8 (H) 02/09/2018         Assessment & Plan:   Problem List Items Addressed This Visit    Obesity (Chronic)    Encouraged DASH diet, decrease po intake and increase exercise as tolerated. Needs 7-8 hours of sleep nightly. Avoid trans fats, eat small, frequent meals every 4-5 hours with Doyle proteins, complex carbs and healthy fats. Minimize simple carbs      MIXED HYPERLIPIDEMIA    Tolerating statin, encouraged heart healthy diet, avoid trans fats, minimize simple carbs and saturated fats. Increase exercise as tolerated      Relevant Orders   Lipid panel   Diabetes mellitus type 2 in obese (HCC)    hgba1c acceptable, minimize simple carbs. Increase exercise as tolerated. Continue current meds      Relevant Orders   Hemoglobin A1c   HTN (hypertension)    Well controlled, no changes to meds. Encouraged heart healthy diet such as the DASH diet and exercise as tolerated.       Relevant Orders   CBC   Comprehensive metabolic panel   TSH   Microalbumin / creatinine urine ratio   Preventative health care    Patient encouraged to maintain heart healthy diet, regular exercise, adequate sleep. Consider daily probiotics. Take medications as prescribed. Labs ordered and reviewed      H/O alcohol abuse    Varies intake but asked to start Thiamine and Folic Acid      Low testosterone in male - Primary    Check levels and continue treatment      Relevant Orders   Testosterone (Completed)   Testosterone   Hyponatremia    Check cmp today which showed worsening hyponatremia despite patient being asymptomatic and the patient minimizing water and using only gatorade. Became necessary to ask patient to present to ER. He did not immediately comply after agreeing to do so but eventually was able      Relevant Orders    Comprehensive metabolic panel (Completed)   Comprehensive metabolic panel   COPD mixed type ([place])    No recent flares       Other Visit Diagnoses    Rising PSA level       Relevant Orders   PSA      I am having Kvon A. Tibbett maintain his Multiple Vitamins-Minerals (PA MENS 50 PLUS VITAPAK PO), vitamin B-12, glucose blood, freestyle, NEEDLE (DISP) 22 G, umeclidinium-vilanterol, metoprolol, metFORMIN, glimepiride, albuterol, simvastatin, and lisinopril.  No orders of the defined types were placed in this encounter.   CMA served as Education administrator during this visit. History, Physical and Plan performed by medical provider. Documentation and orders reviewed  and attested to.  Penni Homans, MD

## 2018-02-08 NOTE — Assessment & Plan Note (Signed)
Patient encouraged to maintain heart healthy diet, regular exercise, adequate sleep. Consider daily probiotics. Take medications as prescribed. Labs ordered and reviewed 

## 2018-02-08 NOTE — Assessment & Plan Note (Signed)
Varies intake but asked to start Thiamine and Folic Acid

## 2018-02-08 NOTE — Assessment & Plan Note (Signed)
Check levels and continue treatment

## 2018-02-08 NOTE — Patient Instructions (Addendum)
Shingrix is the new shingles shot, 2 shots over 2-6 months. Here or at pharmacy  Thiamine 643 mcg daily Folic Acid 1 mg daily Preventive Care 40-64 Years, Male Preventive care refers to lifestyle choices and visits with your health care provider that can promote health and wellness. What does preventive care include?  A yearly physical exam. This is also called an annual well check.  Dental exams once or twice a year.  Routine eye exams. Ask your health care provider how often you should have your eyes checked.  Personal lifestyle choices, including: ? Daily care of your teeth and gums. ? Regular physical activity. ? Eating a healthy diet. ? Avoiding tobacco and drug use. ? Limiting alcohol use. ? Practicing safe sex. ? Taking low-dose aspirin every day starting at age 62. What happens during an annual well check? The services and screenings done by your health care provider during your annual well check will depend on your age, overall health, lifestyle risk factors, and family history of disease. Counseling Your health care provider may ask you questions about your:  Alcohol use.  Tobacco use.  Drug use.  Emotional well-being.  Home and relationship well-being.  Sexual activity.  Eating habits.  Work and work Statistician.  Screening You may have the following tests or measurements:  Height, weight, and BMI.  Blood pressure.  Lipid and cholesterol levels. These may be checked every 5 years, or more frequently if you are over 7 years old.  Skin check.  Lung cancer screening. You may have this screening every year starting at age 64 if you have a 30-pack-year history of smoking and currently smoke or have quit within the past 15 years.  Fecal occult blood test (FOBT) of the stool. You may have this test every year starting at age 53.  Flexible sigmoidoscopy or colonoscopy. You may have a sigmoidoscopy every 5 years or a colonoscopy every 10 years starting at  age 65.  Prostate cancer screening. Recommendations will vary depending on your family history and other risks.  Hepatitis C blood test.  Hepatitis B blood test.  Sexually transmitted disease (STD) testing.  Diabetes screening. This is done by checking your blood sugar (glucose) after you have not eaten for a while (fasting). You may have this done every 1-3 years.  Discuss your test results, treatment options, and if necessary, the need for more tests with your health care provider. Vaccines Your health care provider may recommend certain vaccines, such as:  Influenza vaccine. This is recommended every year.  Tetanus, diphtheria, and acellular pertussis (Tdap, Td) vaccine. You may need a Td booster every 10 years.  Varicella vaccine. You may need this if you have not been vaccinated.  Zoster vaccine. You may need this after age 28.  Measles, mumps, and rubella (MMR) vaccine. You may need at least one dose of MMR if you were born in 1957 or later. You may also need a second dose.  Pneumococcal 13-valent conjugate (PCV13) vaccine. You may need this if you have certain conditions and have not been vaccinated.  Pneumococcal polysaccharide (PPSV23) vaccine. You may need one or two doses if you smoke cigarettes or if you have certain conditions.  Meningococcal vaccine. You may need this if you have certain conditions.  Hepatitis A vaccine. You may need this if you have certain conditions or if you travel or work in places where you may be exposed to hepatitis A.  Hepatitis B vaccine. You may need this if you have  certain conditions or if you travel or work in places where you may be exposed to hepatitis B.  Haemophilus influenzae type b (Hib) vaccine. You may need this if you have certain risk factors.  Talk to your health care provider about which screenings and vaccines you need and how often you need them. This information is not intended to replace advice given to you by your  health care provider. Make sure you discuss any questions you have with your health care provider. Document Released: 08/31/2015 Document Revised: 04/23/2016 Document Reviewed: 06/05/2015 Elsevier Interactive Patient Education  [employer].

## 2018-02-08 NOTE — Telephone Encounter (Signed)
Patient contacted and asked to present to the ER for evaluation and monitoring

## 2018-02-08 NOTE — Assessment & Plan Note (Signed)
Encouraged DASH diet, decrease po intake and increase exercise as tolerated. Needs 7-8 hours of sleep nightly. Avoid trans fats, eat small, frequent meals every 4-5 hours with lean proteins, complex carbs and healthy fats. Minimize simple carbs 

## 2018-02-08 NOTE — Assessment & Plan Note (Signed)
Well controlled, no changes to meds. Encouraged heart healthy diet such as the DASH diet and exercise as tolerated.  °

## 2018-02-08 NOTE — Assessment & Plan Note (Addendum)
Check cmp today which showed worsening hyponatremia despite patient being asymptomatic and the patient minimizing water and using only gatorade. Became necessary to ask patient to present to ER. He did not immediately comply after agreeing to do so but eventually was able

## 2018-02-09 ENCOUNTER — Observation Stay (HOSPITAL_BASED_OUTPATIENT_CLINIC_OR_DEPARTMENT_OTHER)
Admission: EM | Admit: 2018-02-09 | Discharge: 2018-02-10 | Disposition: A | Discharge status: Home / Self Care | Payer: BC Managed Care – PPO | Attending: Internal Medicine | Admitting: Internal Medicine

## 2018-02-09 ENCOUNTER — Other Ambulatory Visit: Payer: Self-pay

## 2018-02-09 ENCOUNTER — Encounter (HOSPITAL_BASED_OUTPATIENT_CLINIC_OR_DEPARTMENT_OTHER): Payer: Self-pay | Admitting: *Deleted

## 2018-02-09 DIAGNOSIS — Z79899 Other long term (current) drug therapy: Secondary | ICD-10-CM | POA: Insufficient documentation

## 2018-02-09 DIAGNOSIS — Z6834 Body mass index (BMI) 34.0-34.9, adult: Secondary | ICD-10-CM | POA: Insufficient documentation

## 2018-02-09 DIAGNOSIS — Z8049 Family history of malignant neoplasm of other genital organs: Secondary | ICD-10-CM | POA: Insufficient documentation

## 2018-02-09 DIAGNOSIS — R202 Paresthesia of skin: Secondary | ICD-10-CM | POA: Insufficient documentation

## 2018-02-09 DIAGNOSIS — F1010 Alcohol abuse, uncomplicated: Secondary | ICD-10-CM | POA: Insufficient documentation

## 2018-02-09 DIAGNOSIS — Z6830 Body mass index (BMI) 30.0-30.9, adult: Secondary | ICD-10-CM

## 2018-02-09 DIAGNOSIS — Z825 Family history of asthma and other chronic lower respiratory diseases: Secondary | ICD-10-CM | POA: Insufficient documentation

## 2018-02-09 DIAGNOSIS — F1011 Alcohol abuse, in remission: Secondary | ICD-10-CM

## 2018-02-09 DIAGNOSIS — E119 Type 2 diabetes mellitus without complications: Secondary | ICD-10-CM | POA: Insufficient documentation

## 2018-02-09 DIAGNOSIS — Z8042 Family history of malignant neoplasm of prostate: Secondary | ICD-10-CM | POA: Insufficient documentation

## 2018-02-09 DIAGNOSIS — E669 Obesity, unspecified: Secondary | ICD-10-CM | POA: Diagnosis not present

## 2018-02-09 DIAGNOSIS — Z87898 Personal history of other specified conditions: Secondary | ICD-10-CM | POA: Diagnosis not present

## 2018-02-09 DIAGNOSIS — E871 Hypo-osmolality and hyponatremia: Principal | ICD-10-CM | POA: Diagnosis present

## 2018-02-09 DIAGNOSIS — Z7984 Long term (current) use of oral hypoglycemic drugs: Secondary | ICD-10-CM | POA: Diagnosis not present

## 2018-02-09 DIAGNOSIS — J439 Emphysema, unspecified: Secondary | ICD-10-CM

## 2018-02-09 DIAGNOSIS — Z87891 Personal history of nicotine dependence: Secondary | ICD-10-CM | POA: Insufficient documentation

## 2018-02-09 DIAGNOSIS — E1169 Type 2 diabetes mellitus with other specified complication: Secondary | ICD-10-CM | POA: Diagnosis present

## 2018-02-09 DIAGNOSIS — Z8249 Family history of ischemic heart disease and other diseases of the circulatory system: Secondary | ICD-10-CM | POA: Diagnosis not present

## 2018-02-09 DIAGNOSIS — Z833 Family history of diabetes mellitus: Secondary | ICD-10-CM | POA: Diagnosis not present

## 2018-02-09 DIAGNOSIS — I10 Essential (primary) hypertension: Secondary | ICD-10-CM | POA: Diagnosis not present

## 2018-02-09 LAB — URINALYSIS, ROUTINE W REFLEX MICROSCOPIC
Bilirubin Urine: NEGATIVE
GLUCOSE, UA: NEGATIVE mg/dL
Hgb urine dipstick: NEGATIVE
Ketones, ur: NEGATIVE mg/dL
LEUKOCYTES UA: NEGATIVE
Nitrite: NEGATIVE
PH: 7 (ref 5.0–8.0)
Protein, ur: NEGATIVE mg/dL
SPECIFIC GRAVITY, URINE: 1.01 (ref 1.005–1.030)

## 2018-02-09 LAB — CBC WITH DIFFERENTIAL/PLATELET
Basophils Absolute: 0.1 10*3/uL (ref 0.0–0.1)
Basophils Relative: 1 %
EOS PCT: 2 %
Eosinophils Absolute: 0.1 10*3/uL (ref 0.0–0.7)
HEMATOCRIT: 34.9 % — AB (ref 39.0–52.0)
HEMOGLOBIN: 13.2 g/dL (ref 13.0–17.0)
LYMPHS ABS: 1.1 10*3/uL (ref 0.7–4.0)
Lymphocytes Relative: 20 %
MCH: 33.1 pg (ref 26.0–34.0)
MCHC: 37.8 g/dL — ABNORMAL HIGH (ref 30.0–36.0)
MCV: 87.5 fL (ref 78.0–100.0)
MONOS PCT: 13 %
Monocytes Absolute: 0.7 10*3/uL (ref 0.1–1.0)
Neutro Abs: 3.7 10*3/uL (ref 1.7–7.7)
Neutrophils Relative %: 64 %
Platelets: 383 10*3/uL (ref 150–400)
RBC: 3.99 MIL/uL — ABNORMAL LOW (ref 4.22–5.81)
RDW: 11.3 % — AB (ref 11.5–15.5)
WBC: 5.7 10*3/uL (ref 4.0–10.5)

## 2018-02-09 LAB — COMPREHENSIVE METABOLIC PANEL
ALBUMIN: 4.7 g/dL (ref 3.5–5.0)
ALT: 57 U/L — ABNORMAL HIGH (ref 0–44)
AST: 33 U/L (ref 15–41)
Alkaline Phosphatase: 62 U/L (ref 38–126)
Anion gap: 10 (ref 5–15)
BUN: 8 mg/dL (ref 6–20)
CHLORIDE: 83 mmol/L — AB (ref 98–111)
CO2: 27 mmol/L (ref 22–32)
Calcium: 9.7 mg/dL (ref 8.9–10.3)
Creatinine, Ser: 0.87 mg/dL (ref 0.61–1.24)
GFR calc Af Amer: >60 mL/min (ref >60)
Glucose, Bld: 118 mg/dL — ABNORMAL HIGH (ref 70–99)
POTASSIUM: 3.8 mmol/L (ref 3.5–5.1)
Sodium: 120 mmol/L — ABNORMAL LOW (ref 135–145)
Total Bilirubin: 0.7 mg/dL (ref 0.3–1.2)
Total Protein: 7.7 g/dL (ref 6.5–8.1)

## 2018-02-09 LAB — NA AND K (SODIUM & POTASSIUM), RAND UR
POTASSIUM UR: 21 mmol/L
SODIUM UR: 39 mmol/L

## 2018-02-09 LAB — OSMOLALITY, URINE: Osmolality, Ur: 227 mOsm/kg — ABNORMAL LOW (ref 300–900)

## 2018-02-09 LAB — LIPID PANEL
CHOLESTEROL: 85 mg/dL (ref 0–200)
HDL: 36 mg/dL — AB (ref >40)
LDL Cholesterol: 35 mg/dL (ref 0–99)
Total CHOL/HDL Ratio: 2.4 RATIO
Triglycerides: 72 mg/dL (ref <150)
VLDL: 14 mg/dL (ref 0–40)

## 2018-02-09 LAB — OSMOLALITY: Osmolality: 254 mOsm/kg — ABNORMAL LOW (ref 275–295)

## 2018-02-09 LAB — TSH: TSH: 1.275 u[IU]/mL (ref 0.350–4.500)

## 2018-02-09 LAB — HEMOGLOBIN A1C
Hgb A1c MFr Bld: 5.8 % — ABNORMAL HIGH (ref 4.8–5.6)
Mean Plasma Glucose: 119.76 mg/dL

## 2018-02-09 LAB — CORTISOL: Cortisol, Plasma: 5.3 ug/dL

## 2018-02-09 LAB — SODIUM, URINE, RANDOM: SODIUM UR: 39 mmol/L

## 2018-02-09 LAB — CORTISOL-AM, BLOOD: Cortisol - AM: 5.3 ug/dL — ABNORMAL LOW (ref 6.7–22.6)

## 2018-02-09 MED ORDER — SODIUM CHLORIDE 0.9 % IV SOLN
INTRAVENOUS | Status: DC
  Administered 2018-02-09: 16:00:00 via INTRAVENOUS

## 2018-02-09 MED ORDER — ONDANSETRON HCL 4 MG/2ML IJ SOLN: 4.0000 mg | Freq: Four times a day (QID) | INTRAMUSCULAR | Status: DC | PRN

## 2018-02-09 MED ORDER — FOLIC ACID 1 MG PO TABS
1.0000 mg | ORAL_TABLET | Freq: Every day | ORAL | Status: DC
  Administered 2018-02-09 – 2018-02-10 (×2): 1 mg via ORAL
  Filled 2018-02-09 (×2): qty 1

## 2018-02-09 MED ORDER — ACETAMINOPHEN 325 MG PO TABS: 650.0000 mg | ORAL_TABLET | Freq: Four times a day (QID) | ORAL | Status: DC | PRN

## 2018-02-09 MED ORDER — ADULT MULTIVITAMIN W/MINERALS CH
1.0000 | ORAL_TABLET | Freq: Every day | ORAL | Status: DC
  Administered 2018-02-09 – 2018-02-10 (×2): 1 via ORAL
  Filled 2018-02-09 (×2): qty 1

## 2018-02-09 MED ORDER — VITAMIN B-1 100 MG PO TABS
100.0000 mg | ORAL_TABLET | Freq: Every day | ORAL | Status: DC
Start: 2018-02-09 — End: 2018-02-10
  Administered 2018-02-09 – 2018-02-10 (×2): 100 mg via ORAL
  Filled 2018-02-09 (×2): qty 1

## 2018-02-09 MED ORDER — ACETAMINOPHEN 650 MG RE SUPP: 650.0000 mg | Freq: Four times a day (QID) | RECTAL | Status: DC | PRN

## 2018-02-09 MED ORDER — ONDANSETRON HCL 4 MG PO TABS: 4.0000 mg | ORAL_TABLET | Freq: Four times a day (QID) | ORAL | Status: DC | PRN

## 2018-02-09 NOTE — H&P (Signed)
History and Physical    Phillip Doyle BPZ:[id number] DOB: 1956-06-15 DOA: 02/09/2018  PCP: Mosie Lukes, MD   Patient coming from: Home  I have personally briefly reviewed patient's old medical records in [place]  Chief Complaint: Referred by PCP for low sodium  HPI: Phillip Doyle is a 62 y.o. male with medical history significant of diabetes mellitus type 2, hypertension, hyperlipidemia, COPD, alcohol abuse was referred to by his primary care provider to the emergency room for low sodium.  Patient had seen his primary care provider on 02/08/2018 for annual physical and had labs done.  Serum sodium was found to be 111.  His serum sodium was 116 on 02/04/2018 for he was told to take Gatorade, increase his salt intake and then be rechecked yesterday.  Patient denies any headache, nausea, vomiting, seizure-like activity, excessive weakness, loss of consciousness.  He only mentions of occasional tingling in his fingers and toes.  Patient endorses to drinking a lot of water recently because of excessive heat.  Patient used to drink a lot of beer, up to 12 cans of beer a day until few months ago but has been trying to cut down on the same.  Patient denies any fever, shortness of breath, leg swelling, chest pain, palpitations, excessive weight gain or increased abdominal girth.   ED Course: Serum sodium was found to be 120.  Hospitalist service was called to evaluate the patient.  Patient was transferred from [facility].  Review of Systems: As per HPI otherwise 10 point review of systems negative.    Past Medical History:  Diagnosis Date  . Alcohol abuse 11/28/2013  . ALLERGIC RHINITIS 02/15/2007  . Allergy    rhinitis  . Bronchitis 07/14/2012  . Diabetes mellitus   . DM 10/24/2010  . ED (erectile dysfunction) 02/17/2012  . EXOGENOUS OBESITY 02/15/2007  . H/O alcohol abuse 11/28/2013  . H/O tobacco use, presenting hazards to health 07/04/2010   Qualifier:  Diagnosis of  By: [name] MD, Erline Levine  1/2 ppd   . HTN (hypertension) 08/20/2011  . Hypertension   . HYPERTENSION 02/15/2007  . Impaired glucose tolerance   . 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 Low testosterone level in male 08/01/2016  . LUMBAR STRAIN 10/17/2010  . Mixed hyperlipidemia 07/04/2010  . NISSEN FUNDOPLICATION, HX OF 2/42/3536  . OA (osteoarthritis) of knee 02/17/2012  . Obesity   . PEPTIC ULCER DISEASE 02/15/2007  . Personal history of colonic polyps 01/17/2013   colonoscoppy 2013, benign polyp advised repeat in 10 years  . Preventative health care 07/19/2012  . Restless sleeper 10/30/2016  . TOBACCO ABUSE 07/04/2010  . Ulcer 1982   peptic ulcer disease    Past Surgical History:  Procedure Laterality Date  . COLONOSCOPY  03/2012   Several hyperplastic polyps  . fundiplication for [facility] and reflux  1987  . negative stress test  2008  . ROTATOR CUFF REPAIR  2000  . VASECTOMY  1982   Family history  reports that he quit smoking about 3 years ago. His smoking use included cigarettes. He has a 80.00 pack-year smoking history. He quit smokeless tobacco use about 5 years ago. He reports that he drinks about 1.8 - 2.4 oz of alcohol per week. He reports that he does not use drugs.  -Lives with family at home  No Known Allergies  Family History  Problem Relation Age of Onset  . Asthma Mother   . Cancer  Mother        uterine   . Alcohol abuse Father   . Stroke Maternal Grandmother        possibly  . Hypertension Maternal Grandfather   . Hypertension Brother   . Diabetes Brother   . Heart disease Brother        aortic valve replaced, CAD  . Cancer Brother        prostate  . Colon cancer Neg Hx     Prior to Admission medications   Medication Sig Start Date End Date Taking? Authorizing Provider  acetaminophen (TYLENOL) 650 MG CR tablet Take 1,300 mg by mouth 2 (two) times daily as needed for pain.   Yes [provider]  albuterol (PROVENTIL HFA;VENTOLIN HFA) 108  (90 Base) MCG/ACT inhaler Inhale 2 puffs into the lungs every 6 (six) hours as needed for wheezing or shortness of breath. 12/14/17  Yes Young, Clinton D, MD  glimepiride (AMARYL) 1 MG tablet TAKE 1 TABLET BY MOUTH EVERY DAY WITH BREAKFAST 12/07/17  Yes Mosie Lukes, MD  lisinopril (PRINIVIL,ZESTRIL) 20 MG tablet Take 1 tablet (20 mg total) by mouth 2 (two) times daily. 02/04/18  Yes Mosie Lukes, MD  Menthol-Methyl Salicylate (MUSCLE RUB) 10-15 % CREA Apply 1 application topically as needed for muscle pain.   Yes [provider]  metFORMIN (GLUCOPHAGE) 1000 MG tablet TAKE 1 TABLET BY MOUTH 2 TIMES DAILY WITH A MEAL. 11/27/17  Yes Mosie Lukes, MD  metoprolol (TOPROL-XL) 200 MG 24 hr tablet Take 1 tablet (200 mg total) by mouth daily. 11/26/17  Yes Mosie Lukes, MD  Multiple Vitamins-Minerals (PA MENS 50 PLUS VITAPAK PO) Take 1 capsule by mouth daily.   Yes [provider]  NEEDLE, DISP, 22 G 22G X 1-1/2" MISC Inject 1 application into the muscle every 14 (fourteen) days. 02/09/17  Yes Mosie Lukes, MD  simvastatin (ZOCOR) 10 MG tablet TAKE 1 TABLET BY MOUTH EVERY DAY AT 6PM 01/08/18  Yes Mosie Lukes, MD  sitaGLIPtin (JANUVIA) 100 MG tablet Take 1 tablet (100 mg total) by mouth daily. 08/06/17  Yes Mosie Lukes, MD  testosterone cypionate (DEPOTESTOSTERONE CYPIONATE) 200 MG/ML injection INJECT 0.5 MLS INTO THE MUSCLE ONCE FOR 1 DOSE 12/27/17  Yes [provider]  umeclidinium-vilanterol (ANORO ELLIPTA) 62.5-25 MCG/INH AEPB Inhale 1 puff into the lungs daily. 08/13/17  Yes Young, Tarri Fuller D, MD  vitamin B-12 (CYANOCOBALAMIN) 100 MCG tablet Take 100 mcg by mouth daily.   Yes [provider]  glucose blood test strip Check blood sugar twice daily. DX: E11.9 08/01/16   Mosie Lukes, MD  Lancets (FREESTYLE) lancets Check blood sugar twice daily.  DX E11.9 08/01/16   Mosie Lukes, MD  metoprolol (TOPROL-XL) 200 MG 24 hr tablet TAKE 1 TABLET BY MOUTH  DAILY Patient not taking: Reported on 02/09/2018 01/08/18   Mosie Lukes, MD    Physical Exam: Vitals:   02/09/18 1009 02/09/18 1241 02/09/18 1345  BP: (!) 150/82 125/82 (!) 145/81  Pulse: 76 72 75  Resp: 16 19 16   Temp: 98.1 F (36.7 C)  98.3 F (36.8 C)  TempSrc: Oral  Oral  SpO2: 98% 99% 97%  Weight: 110.7 kg (244 lb)  108.1 kg (238 lb 5.1 oz)  Height: 5\' 9"  (1.753 m)  5\' 9"  (1.753 m)    Constitutional: NAD, calm, comfortable Vitals:   02/09/18 1009 02/09/18 1241 02/09/18 1345  BP: (!) 150/82 125/82 (!) 145/81  Pulse:  76 72 75  Resp: 16 19 16   Temp: 98.1 F (36.7 C)  98.3 F (36.8 C)  TempSrc: Oral  Oral  SpO2: 98% 99% 97%  Weight: 110.7 kg (244 lb)  108.1 kg (238 lb 5.1 oz)  Height: 5\' 9"  (1.753 m)  5\' 9"  (1.753 m)   Eyes: PERRL, lids and conjunctivae normal ENMT: Mucous membranes are moist. Posterior pharynx clear of any exudate or lesions.  Neck: normal, supple, no masses, no thyromegaly Respiratory: Bilateral decreased breath sounds at bases, no wheezing or crackles. Normal respiratory effort. No accessory muscle use.  Cardiovascular: Regular rate and rhythm, no murmurs / rubs / gallops. No extremity edema. 2+ pedal pulses. No carotid bruits.  Abdomen: no tenderness, no masses palpated. No hepatosplenomegaly. Bowel sounds positive.  Musculoskeletal: no clubbing / cyanosis. No joint deformity upper and lower extremities.  Skin: no rashes, lesions, ulcers. No induration Neurologic: CN 2-12 grossly intact.  No focal neurologic deficit.  Moving extremities.   Psychiatric: Normal judgment and insight. Alert and oriented x 3. Normal mood.    Labs on Admission: I have personally reviewed following labs and imaging studies  CBC: Recent Labs  Lab 02/04/18 0742 02/09/18 1031  WBC 6.4 5.7  NEUTROABS  --  3.7  HGB 13.1 13.2  HCT 36.6* 34.9*  MCV 93.4 87.5  PLT 375.0 696   Basic Metabolic Panel: Recent Labs  Lab 02/04/18 0742 02/08/18 0942 02/09/18 1031  NA  116* 111* 120*  K 4.0 3.9 3.8  CL 80* 78* 83*  CO2 24 24 27   GLUCOSE 128* 120* 118*  BUN 8 8 8   CREATININE 0.79 0.90 0.87  CALCIUM 9.7 9.3 9.7   GFR: Estimated Creatinine Clearance: 109.5 mL/min (by C-G formula based on SCr of 0.87 mg/dL). Liver Function Tests: Recent Labs  Lab 02/04/18 0742 02/08/18 0942 02/09/18 1031  AST 39* 27 33  ALT 52 55* 57*  ALKPHOS 54 63 62  BILITOT 1.0 0.5 0.7  PROT 7.4 7.1 7.7  ALBUMIN 4.9 4.7 4.7   No results for input(s): LIPASE, AMYLASE in the last 168 hours. No results for input(s): AMMONIA in the last 168 hours. Coagulation Profile: No results for input(s): INR, PROTIME in the last 168 hours. Cardiac Enzymes: No results for input(s): CKTOTAL, CKMB, CKMBINDEX, TROPONINI in the last 168 hours. BNP (last 3 results) No results for input(s): PROBNP in the last 8760 hours. HbA1C: No results for input(s): HGBA1C in the last 72 hours. CBG: No results for input(s): GLUCAP in the last 168 hours. Lipid Profile: No results for input(s): CHOL, HDL, LDLCALC, TRIG, CHOLHDL, LDLDIRECT in the last 72 hours. Thyroid Function Tests: No results for input(s): TSH, T4TOTAL, FREET4, T3FREE, THYROIDAB in the last 72 hours. Anemia Panel: No results for input(s): VITAMINB12, FOLATE, FERRITIN, TIBC, IRON, RETICCTPCT in the last 72 hours. Urine analysis:    Component Value Date/Time   COLORURINE YELLOW 02/09/2018 1031   APPEARANCEUR CLEAR 02/09/2018 1031   LABSPEC 1.010 02/09/2018 1031   PHURINE 7.0 02/09/2018 1031   GLUCOSEU NEGATIVE 02/09/2018 1031   GLUCOSEU > 1000 mg/dL (A) 10/23/2010 1817   HGBUR NEGATIVE 02/09/2018 1031   HGBUR trace-lysed 01/28/2008 0827   BILIRUBINUR NEGATIVE 02/09/2018 1031   KETONESUR NEGATIVE 02/09/2018 1031   PROTEINUR NEGATIVE 02/09/2018 1031   UROBILINOGEN 0.2 10/23/2010 1817   NITRITE NEGATIVE 02/09/2018 1031   LEUKOCYTESUR NEGATIVE 02/09/2018 1031    Radiological Exams on Admission: No results  found.   Assessment/Plan Active Problems:   Hyponatremia  Diabetes mellitus type 2 in obese (HCC)   HTN (hypertension)    Acute on chronic hyponatremia -Probably secondary to excessive fluid intake.  Patient states that he is cutting down on his beer intake/does not sound as if patient has beer put to mania. -Patient is currently asymptomatic with no neurologic symptoms -Gentle hydration with normal saline at 100 cc an hour.  Will also put him on oral fluid restriction -Serum sodium/serum osmolality/urine sodium/urine osmolality/TSH/cortisol levels/lipid profile -Repeat a.m. Labs.  Diabetes mellitus type 2 -Hold oral meds.  Accu-Cheks with insulin sliding scale coverage.  Hypertension -Monitor blood pressure.  Continue lisinopril and metoprolol  History of alcohol abuse -Patient states that he does not drink as much alcohol these days until compared to few months ago. start multivitamin, thiamine, folate.  COPD -Stable.  Use albuterol as needed  Obesity -Outpatient follow-up  DVT prophylaxis: SCDs/early ambulation Code Status: Full Family Communication: Spoke to wife at bedside Disposition Plan: Home in a.m. if sodium level improves Consults called: None Admission status: Observation in telemetry  Severity of Illness: The appropriate patient status for this patient is OBSERVATION. Observation status is judged to be reasonable and necessary in order to provide the required intensity of service to ensure the patient's safety. The patient's presenting symptoms, physical exam findings, and initial radiographic and laboratory data in the context of their medical condition is felt to place them at decreased risk for further clinical deterioration. Furthermore, it is anticipated that the patient will be medically stable for discharge from the hospital within 2 midnights of admission. The following factors support the patient status of observation.   " The patient's presenting  symptoms include mild intermittent tingling. " The physical exam findings include no abnormality. " The initial radiographic and laboratory data are hyponatremia.      Phillip August MD [facility] Pager [phone]  If 7PM-7AM, please contact night-coverage [email or web address] Password [facility]  02/09/2018, 2:39 PM

## 2018-02-09 NOTE — ED Triage Notes (Signed)
Patient saw Dr. Randel Pigg yesterday and called him yesterday at 2:30 after lab results came in and advised him to come to the ED.

## 2018-02-10 ENCOUNTER — Other Ambulatory Visit: Payer: Self-pay

## 2018-02-10 DIAGNOSIS — I10 Essential (primary) hypertension: Secondary | ICD-10-CM | POA: Diagnosis not present

## 2018-02-10 DIAGNOSIS — E669 Obesity, unspecified: Secondary | ICD-10-CM

## 2018-02-10 DIAGNOSIS — J439 Emphysema, unspecified: Secondary | ICD-10-CM

## 2018-02-10 DIAGNOSIS — F1010 Alcohol abuse, uncomplicated: Secondary | ICD-10-CM | POA: Diagnosis not present

## 2018-02-10 DIAGNOSIS — E1169 Type 2 diabetes mellitus with other specified complication: Secondary | ICD-10-CM

## 2018-02-10 DIAGNOSIS — E871 Hypo-osmolality and hyponatremia: Secondary | ICD-10-CM | POA: Diagnosis not present

## 2018-02-10 DIAGNOSIS — F1011 Alcohol abuse, in remission: Secondary | ICD-10-CM

## 2018-02-10 LAB — BASIC METABOLIC PANEL WITH GFR
Anion gap: 6 (ref 5–15)
BUN: 7 mg/dL (ref 6–20)
CO2: 29 mmol/L (ref 22–32)
Calcium: 8.8 mg/dL — ABNORMAL LOW (ref 8.9–10.3)
Chloride: 93 mmol/L — ABNORMAL LOW (ref 98–111)
Creatinine, Ser: 0.79 mg/dL (ref 0.61–1.24)
GFR calc Af Amer: >60 mL/min
GFR calc non Af Amer: >60 mL/min
Glucose, Bld: 159 mg/dL — ABNORMAL HIGH (ref 70–99)
Potassium: 4.5 mmol/L (ref 3.5–5.1)
Sodium: 128 mmol/L — ABNORMAL LOW (ref 135–145)

## 2018-02-10 LAB — COMPREHENSIVE METABOLIC PANEL WITH GFR
ALT: 53 U/L — ABNORMAL HIGH (ref 0–44)
AST: 25 U/L (ref 15–41)
Albumin: 4.1 g/dL (ref 3.5–5.0)
Alkaline Phosphatase: 45 U/L (ref 38–126)
Anion gap: 9 (ref 5–15)
BUN: 7 mg/dL (ref 6–20)
CO2: 28 mmol/L (ref 22–32)
Calcium: 9 mg/dL (ref 8.9–10.3)
Chloride: 92 mmol/L — ABNORMAL LOW (ref 98–111)
Creatinine, Ser: 0.81 mg/dL (ref 0.61–1.24)
GFR calc Af Amer: >60 mL/min
GFR calc non Af Amer: >60 mL/min
Glucose, Bld: 121 mg/dL — ABNORMAL HIGH (ref 70–99)
Potassium: 4 mmol/L (ref 3.5–5.1)
Sodium: 129 mmol/L — ABNORMAL LOW (ref 135–145)
Total Bilirubin: 0.6 mg/dL (ref 0.3–1.2)
Total Protein: 6.7 g/dL (ref 6.5–8.1)

## 2018-02-10 LAB — CBC
HCT: 33.9 % — ABNORMAL LOW (ref 39.0–52.0)
Hemoglobin: 12.2 g/dL — ABNORMAL LOW (ref 13.0–17.0)
MCH: 32.8 pg (ref 26.0–34.0)
MCHC: 36 g/dL (ref 30.0–36.0)
MCV: 91.1 fL (ref 78.0–100.0)
Platelets: 336 10*3/uL (ref 150–400)
RBC: 3.72 MIL/uL — ABNORMAL LOW (ref 4.22–5.81)
RDW: 12.2 % (ref 11.5–15.5)
WBC: 5.2 10*3/uL (ref 4.0–10.5)

## 2018-02-10 LAB — HIV ANTIBODY (ROUTINE TESTING W REFLEX): HIV Screen 4th Generation wRfx: NONREACTIVE

## 2018-02-10 MED ORDER — THIAMINE HCL 100 MG PO TABS: 100.0000 mg | ORAL_TABLET | Freq: Every day | ORAL | Status: DC

## 2018-02-10 MED ORDER — SITAGLIPTIN PHOSPHATE 100 MG PO TABS: 100.0000 mg | ORAL_TABLET | Freq: Every day | ORAL | 5 refills | Status: DC

## 2018-02-10 MED ORDER — FOLIC ACID 1 MG PO TABS: 1.0000 mg | ORAL_TABLET | Freq: Every day | ORAL | Status: DC

## 2018-02-10 NOTE — Care Management Note (Signed)
Case Management Note  Patient Details  Name: Phillip Doyle MRN: [id number] Date of Birth: 08-21-1955  Subjective/Objective:                    Action/Plan:d/c home.   Expected Discharge Date:  02/10/18               Expected Discharge Plan:  Home/Self Care  In-House Referral:     Discharge planning Services  CM Consult  Post Acute Care Choice:    Choice offered to:     DME Arranged:    DME Agency:     HH Arranged:    HH Agency:     Status of Service:  Completed, signed off  If discussed at [employer] of Stay Meetings, dates discussed:    Additional Comments:  Dessa Phi, RN 02/10/2018, 3:25 PM

## 2018-02-10 NOTE — Discharge Summary (Signed)
Physician Discharge Summary  Phillip Doyle [id number]:[id number] DOB: Dec 11, 1955 DOA: 02/09/2018  PCP: Phillip Lukes, MD  Admit date: 02/09/2018 Discharge date: 02/10/2018  Time spent: 50 minutes  Recommendations for Outpatient Follow-up:  1. Follow-up with Phillip Lukes, MD in 1 week.  On follow-up patient will need a basic metabolic profile done to follow-up on electrolytes and renal function.   Discharge Diagnoses:  Principal Problem:   Hyponatremia Active Problems:   Diabetes mellitus type 2 in obese (HCC)   HTN (hypertension)   Alcohol abuse   Pulmonary emphysema ([place])   Discharge Condition: Stable and improved.  Diet recommendation: Carb modified diet.  Filed Weights   02/09/18 1009 02/09/18 1345 02/10/18 0500  Weight: 110.7 kg (244 lb) 108.1 kg (238 lb 5.1 oz) 107 kg (235 lb 14.4 oz)    History of present illness:  Per Dr. Guadelupe Doyle is a 62 y.o. male with medical history significant of diabetes mellitus type 2, hypertension, hyperlipidemia, COPD, alcohol abuse was referred to by his primary care provider to the emergency room for low sodium.  Patient had seen his primary care provider on 02/08/2018 for annual physical and had labs done.  Serum sodium was found to be 111.  His serum sodium was 116 on 02/04/2018 for he was told to take Gatorade, increase his salt intake and then be rechecked yesterday.  Patient denied any headache, nausea, vomiting, seizure-like activity, excessive weakness, loss of consciousness.  He only mentioned of occasional tingling in his fingers and toes.  Patient endorsed to drinking a lot of water recently because of excessive heat.  Patient used to drink a lot of beer, up to 12 cans of beer a day until few months ago but has been trying to cut down on the same.  Patient denied any fever, shortness of breath, leg swelling, chest pain, palpitations, excessive weight gain or increased abdominal girth.   ED Course: Serum sodium was found to be 120.   Hospitalist service was called to evaluate the patient.  Patient was [facility] to [facility] Course:  #1 acute on chronic hyponatremia Patient admitted with abnormal labs and noted to have a sodium of 111 at PCPs office.  Questionable etiology.  Patient does endorse excessive fluid intake recently and had been cutting down on his alcohol intake.  Patient with no focal neurological symptoms.  Patient was hydrated with IV fluids and placed on oral fluid restriction of 1500 cc/day.  Serum osmolality obtained was 254.  TSH was within normal limits at 1.275.  Urine osmolality was 227.  Urine sodium was 39.  Urine potassium was 21.  Patient was hydrated with IV fluids with improvement of his sodium level such that by day of discharge sodium was up to 128.  Asymptomatic.  Patient will be sent home on a fluid restriction of 1200 cc/day and is to follow-up with PCP 1 week for discharge for repeat lab work.  2.  Diabetes mellitus type 2 Patient's oral diabetic medications were held.  Patient was maintained on sliding scale insulin.  3.  Hypertension Remained stable.  Patient maintained on home regimen of metoprolol and lisinopril.  4.  History of alcohol abuse Patient stated that he had cut back on his alcohol use recently.  Patient did not have any signs of withdrawal.  Patient was maintained on thiamine, folate, multivitamin.  5.  COPD Remained stable throughout the hospitalization.  Procedures:  None  Consultations:  None  Discharge Exam: Vitals:   02/10/18 0544 02/10/18 1343  BP: 121/78 132/82  Pulse: 77 86  Resp: 18 16  Temp: 98.2 F (36.8 C) 98.6 F (37 C)  SpO2: 99% 99%    General: NAD Cardiovascular: RRR Respiratory: CTAB  Discharge Instructions   Discharge Instructions    Diet Carb Modified   Complete by:  As directed    1200cc/day fluid restrition   Increase activity slowly   Complete by:  As directed       Allergies as of 02/10/2018   No Known Allergies     Medication List    TAKE these medications   acetaminophen 650 MG CR tablet Commonly known as:  TYLENOL Take 1,300 mg by mouth 2 (two) times daily as needed for pain.   albuterol 108 (90 Base) MCG/ACT inhaler Commonly known as:  PROVENTIL HFA;VENTOLIN HFA Inhale 2 puffs into the lungs every 6 (six) hours as needed for wheezing or shortness of breath.   folic acid 1 MG tablet Commonly known as:  FOLVITE Take 1 tablet (1 mg total) by mouth daily. Start taking on:  02/11/2018   freestyle lancets Check blood sugar twice daily.  DX E11.9   glimepiride 1 MG tablet Commonly known as:  AMARYL TAKE 1 TABLET BY MOUTH EVERY DAY WITH BREAKFAST   glucose blood test strip Check blood sugar twice daily. DX: E11.9   lisinopril 20 MG tablet Commonly known as:  PRINIVIL,ZESTRIL Take 1 tablet (20 mg total) by mouth 2 (two) times daily.   metFORMIN 1000 MG tablet Commonly known as:  GLUCOPHAGE TAKE 1 TABLET BY MOUTH 2 TIMES DAILY WITH A MEAL.   metoprolol 200 MG 24 hr tablet Commonly known as:  TOPROL-XL Take 1 tablet (200 mg total) by mouth daily. What changed:  Another medication with the same name was removed. Continue taking this medication, and follow the directions you see here.   MUSCLE RUB 10-15 % Crea Apply 1 application topically as needed for muscle pain.   NEEDLE (DISP) 22 G 22G X 1-1/2" Misc Inject 1 application into the muscle every 14 (fourteen) days.   PA MENS 50 PLUS VITAPAK PO Take 1 capsule by mouth daily.   simvastatin 10 MG tablet Commonly known as:  ZOCOR TAKE 1 TABLET BY MOUTH EVERY DAY AT 6PM   sitaGLIPtin 100 MG tablet Commonly known as:  JANUVIA Take 1 tablet (100 mg total) by mouth daily.   testosterone cypionate 200 MG/ML injection Commonly known as:  DEPOTESTOSTERONE CYPIONATE INJECT 0.5 MLS INTO THE MUSCLE ONCE FOR 1 DOSE   thiamine 100 MG tablet Take 1 tablet (100 mg total) by mouth  daily. Start taking on:  02/11/2018   umeclidinium-vilanterol 62.5-25 MCG/INH Aepb Commonly known as:  ANORO ELLIPTA Inhale 1 puff into the lungs daily.   vitamin B-12 100 MCG tablet Commonly known as:  CYANOCOBALAMIN Take 100 mcg by mouth daily.      No Known Allergies Follow-up Information    Phillip Lukes, MD. Schedule an appointment as soon as possible for a visit in 1 week(s).   Specialty:  Family Medicine Contact information: [place] [id number] [phone]            The results of significant diagnostics from this hospitalization (including imaging, microbiology, ancillary and laboratory) are listed below for reference.    Significant Diagnostic Studies: No results found.  Microbiology: No results found for this or any previous visit (from  the past 240 hour(s)).   Labs: Basic Metabolic Panel: Recent Labs  Lab 02/04/18 0742 02/08/18 0942 02/09/18 1031 02/10/18 0540 02/10/18 1405  NA 116* 111* 120* 129* 128*  K 4.0 3.9 3.8 4.0 4.5  CL 80* 78* 83* 92* 93*  CO2 24 24 27 28 29   GLUCOSE 128* 120* 118* 121* 159*  BUN 8 8 8 7 7   CREATININE 0.79 0.90 0.87 0.81 0.79  CALCIUM 9.7 9.3 9.7 9.0 8.8*   Liver Function Tests: Recent Labs  Lab 02/04/18 0742 02/08/18 0942 02/09/18 1031 02/10/18 0540  AST 39* 27 33 25  ALT 52 55* 57* 53*  ALKPHOS 54 63 62 45  BILITOT 1.0 0.5 0.7 0.6  PROT 7.4 7.1 7.7 6.7  ALBUMIN 4.9 4.7 4.7 4.1   No results for input(s): LIPASE, AMYLASE in the last 168 hours. No results for input(s): AMMONIA in the last 168 hours. CBC: Recent Labs  Lab 02/04/18 0742 02/09/18 1031 02/10/18 0540  WBC 6.4 5.7 5.2  NEUTROABS  --  3.7  --   HGB 13.1 13.2 12.2*  HCT 36.6* 34.9* 33.9*  MCV 93.4 87.5 91.1  PLT 375.0 383 336   Cardiac Enzymes: No results for input(s): CKTOTAL, CKMB, CKMBINDEX, TROPONINI in the last 168 hours. BNP: BNP (last 3 results) No results for input(s): BNP in the last 8760  hours.  ProBNP (last 3 results) No results for input(s): PROBNP in the last 8760 hours.  CBG: No results for input(s): GLUCAP in the last 168 hours.     Signed:  Irine Seal MD.  [facility] 02/10/2018, 3:11 PM

## 2018-02-11 ENCOUNTER — Telehealth: Payer: Self-pay

## 2018-02-11 NOTE — Telephone Encounter (Signed)
02/11/18  Transition Care Management Follow-up Telephone Call  ADMISSION DATE: 02/09/18 DISCHARGE DATE: 02/10/18    How have you been since you were released from the hospital? Feeling good per patient. Do you understand why you were in the hospital? Yes  Do you understand the discharge instrcutions? Yes    Items Reviewed: Medications reviewed: Yes   Allergies reviewed: NKDA  Dietary changes reviewed: Diabetic Carbohydrate modified   Referrals reviewed: Appointment schedulerd with E. Saguier PA-C for Hospital follow up.   Functional Questionnaire:   Activities of Daily Living (ADLs): Patient can perform all independently.  Any patient concerns? None at this time.   Confirmed importance and date/time of follow-up visits scheduled:Yes    Confirmed with patient if condition begins to worsen call PCP or go to the ER. Yes   Patient was given the office number and encouragred to call back with questions or concerns.Yes

## 2018-02-17 ENCOUNTER — Encounter: Payer: Self-pay | Admitting: Medical

## 2018-02-17 ENCOUNTER — Ambulatory Visit (INDEPENDENT_AMBULATORY_CARE_PROVIDER_SITE_OTHER): Payer: BC Managed Care – PPO | Admitting: Medical

## 2018-02-17 VITALS — BP 118/57 | HR 92 | Temp 98.2°F | Resp 16 | Ht 69.0 in | Wt 235.8 lb

## 2018-02-17 DIAGNOSIS — E871 Hypo-osmolality and hyponatremia: Secondary | ICD-10-CM

## 2018-02-17 DIAGNOSIS — E119 Type 2 diabetes mellitus without complications: Secondary | ICD-10-CM | POA: Diagnosis not present

## 2018-02-17 NOTE — Progress Notes (Signed)
Subjective:    Patient ID: Phillip Doyle, male    DOB: [birth year], 62 y.o.   MRN: [id number]  HPI  Pt in for follow up.   Pt was admitted for low NA. He was drinking a lot of water prior to admission on February 09, 2018. He was drinking excessive amount of water attempting to hydrate working outdoors at [employer]. At night was drinking alcohol.   Now he states only one beer at night.  Pt states he drinking sugar free gatorade while at work..   His sugar level this am was 136.  Pt states before admitted to hospital low na found on lab work. He really did not have any dramatic symptoms.  Pt discharge with sodium at 128 on Aug 12, 2018.  When he was admitted the sodium level was around 111.  He never described any severe fatigue or altered mental status.    Review of Systems  Constitutional: Negative for chills, fatigue and fever.  Respiratory: Negative for chest tightness, shortness of breath and wheezing.   Cardiovascular: Negative for chest pain and palpitations.  Gastrointestinal: Negative for abdominal distention, abdominal pain, nausea and vomiting.  Musculoskeletal: Negative for back pain and neck pain.  Neurological: Negative for dizziness, speech difficulty, weakness and headaches.  Hematological: Negative for adenopathy. Does not bruise/bleed easily.  Psychiatric/Behavioral: Negative for behavioral problems, confusion and suicidal ideas. The patient is not nervous/anxious.     Past Medical History:  Diagnosis Date  . Alcohol abuse 11/28/2013  . ALLERGIC RHINITIS 02/15/2007  . Allergy    rhinitis  . Bronchitis 07/14/2012  . Diabetes mellitus   . DM 10/24/2010  . ED (erectile dysfunction) 02/17/2012  . EXOGENOUS OBESITY 02/15/2007  . H/O alcohol abuse 11/28/2013  . H/O tobacco use, presenting hazards to health 07/04/2010   Qualifier: Diagnosis of  By: [name] MD, Erline Levine  1/2 ppd   . HTN (hypertension) 08/20/2011  . Hypertension   . HYPERTENSION 02/15/2007  . Impaired glucose  tolerance   . 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 Low testosterone level in male 08/01/2016  . LUMBAR STRAIN 10/17/2010  . Mixed hyperlipidemia 07/04/2010  . NISSEN FUNDOPLICATION, HX OF 7/34/1937  . OA (osteoarthritis) of knee 02/17/2012  . Obesity   . PEPTIC ULCER DISEASE 02/15/2007  . Personal history of colonic polyps 01/17/2013   colonoscoppy 2013, benign polyp advised repeat in 10 years  . Preventative health care 07/19/2012  . Restless sleeper 10/30/2016  . TOBACCO ABUSE 07/04/2010  . Ulcer 1982   peptic ulcer disease     Social History   Socioeconomic History  . Marital status: Married    Spouse name: [name]  . Number of children: Not on file  . Years of education: Not on file  . Highest education level: Not on file  Occupational History    Employer: [employer]  . Financial resource strain: Not on file  . Food insecurity:    Worry: Not on file    Inability: Not on file  . Transportation needs:    Medical: Not on file    Non-medical: Not on file  Tobacco Use  . Smoking status: Former Smoker    Packs/day: 2.00    Years: 40.00    Pack years: 80.00    Types: Cigarettes    Last attempt to quit: 06/28/2014    Years since quitting: 3.6  . Smokeless tobacco: Former Systems developer    Quit date: 03/18/2012  . Tobacco comment: quit 06/18/10-  started back   Substance and Sexual Activity  . Alcohol use: Yes    Alcohol/week: 1.8 - 2.4 oz    Types: 3 - 4 Cans of beer per week    Comment: 3-4 every other day  . Drug use: No  . Sexual activity: Yes    Partners: Female  Lifestyle  . Physical activity:    Days per week: Not on file    Minutes per session: Not on file  . Stress: Not on file  Relationships  . Social connections:    Talks on phone: Not on file    Gets together: Not on file    Attends religious service: Not on file    Active member of club or organization: Not on file    Attends meetings of clubs or organizations: Not on file    Relationship status: Not on  file  . Intimate partner violence:    Fear of current or ex partner: Not on file    Emotionally abused: Not on file    Physically abused: Not on file    Forced sexual activity: Not on file  Other Topics Concern  . Not on file  Social History Narrative  . Not on file    Past Surgical History:  Procedure Laterality Date  . COLONOSCOPY  03/2012   Several hyperplastic polyps  . fundiplication for [facility] and reflux  1987  . negative stress test  2008  . ROTATOR CUFF REPAIR  2000  . VASECTOMY  1982    Family History  Problem Relation Age of Onset  . Asthma Mother   . Cancer Mother        uterine   . Alcohol abuse Father   . Stroke Maternal Grandmother        possibly  . Hypertension Maternal Grandfather   . Hypertension Brother   . Diabetes Brother   . Heart disease Brother        aortic valve replaced, CAD  . Cancer Brother        prostate  . Colon cancer Neg Hx     No Known Allergies  Current Outpatient Medications on File Prior to Visit  Medication Sig Dispense Refill  . acetaminophen (TYLENOL) 650 MG CR tablet Take 1,300 mg by mouth 2 (two) times daily as needed for pain.    Marland Kitchen albuterol (PROVENTIL HFA;VENTOLIN HFA) 108 (90 Base) MCG/ACT inhaler Inhale 2 puffs into the lungs every 6 (six) hours as needed for wheezing or shortness of breath. 1 Inhaler 12  . folic acid (FOLVITE) 1 MG tablet Take 1 tablet (1 mg total) by mouth daily.    Marland Kitchen glimepiride (AMARYL) 1 MG tablet TAKE 1 TABLET BY MOUTH EVERY DAY WITH BREAKFAST 30 tablet 3  . glucose blood test strip Check blood sugar twice daily. DX: E11.9 100 each 6  . Lancets (FREESTYLE) lancets Check blood sugar twice daily.  DX E11.9 100 each 6  . lisinopril (PRINIVIL,ZESTRIL) 20 MG tablet Take 1 tablet (20 mg total) by mouth 2 (two) times daily. 60 tablet 3  . Menthol-Methyl Salicylate (MUSCLE RUB) 10-15 % CREA Apply 1 application topically as needed for muscle pain.    . metFORMIN (GLUCOPHAGE) 1000 MG tablet TAKE 1 TABLET BY  MOUTH 2 TIMES DAILY WITH A MEAL. 180 tablet 2  . metoprolol (TOPROL-XL) 200 MG 24 hr tablet Take 1 tablet (200 mg total) by mouth daily. 90 tablet 0  . Multiple Vitamins-Minerals (PA MENS 50 PLUS VITAPAK PO) Take 1  capsule by mouth daily.    Marland Kitchen NEEDLE, DISP, 22 G 22G X 1-1/2" MISC Inject 1 application into the muscle every 14 (fourteen) days. 100 each 1  . simvastatin (ZOCOR) 10 MG tablet TAKE 1 TABLET BY MOUTH EVERY DAY AT 6PM 90 tablet 1  . sitaGLIPtin (JANUVIA) 100 MG tablet Take 1 tablet (100 mg total) by mouth daily. 30 tablet 5  . testosterone cypionate (DEPOTESTOSTERONE CYPIONATE) 200 MG/ML injection INJECT 0.5 MLS INTO THE MUSCLE ONCE FOR 1 DOSE  0  . thiamine 100 MG tablet Take 1 tablet (100 mg total) by mouth daily.    Marland Kitchen umeclidinium-vilanterol (ANORO ELLIPTA) 62.5-25 MCG/INH AEPB Inhale 1 puff into the lungs daily. 60 each 12  . vitamin B-12 (CYANOCOBALAMIN) 100 MCG tablet Take 100 mcg by mouth daily.     No current facility-administered medications on file prior to visit.     BP (!) 118/57   Pulse 92   Temp 98.2 F (36.8 C) (Oral)   Resp 16   Ht 5\' 9"  (1.753 m)   Wt 235 lb 12.8 oz (107 kg)   SpO2 97%   BMI 34.82 kg/m       Objective:   Physical Exam   General Mental Status- Alert. General Appearance- Not in acute distress.   Skin General: Color- Normal Color. Moisture- Normal Moisture.  Neck Carotid Arteries- Normal color. Moisture- Normal Moisture. No carotid bruits. No JVD.  Chest and Lung Exam Auscultation: Breath Sounds:-Normal.  Cardiovascular Auscultation:Rythm- Regular. Murmurs & Other Heart Sounds:Auscultation of the heart reveals- No Murmurs.  Abdomen Inspection:-Inspeection Normal. Palpation/Percussion:Note:No mass. Palpation and Percussion of the abdomen reveal- Non Tender, Non Distended + BS, no rebound or guarding.  Neurologic Cranial Nerve exam:- CN III-XII intact(No nystagmus), symmetric smile. Strength:- 5/5 equal and symmetric strength  both upper and lower extremities.      Assessment & Plan:  Clinically, you appear to be doing well post hospitalization for low-sodium.  It was recommended on discharge summary to repeat metabolic panel and I do agree that is a good idea.  You are already following recommendations regarding staying well-hydrated with sugar-free Gatorade.  Avoiding overhydration with plain water.  Also remember to minimize use of alcohol.  Alcohol could lead to dehydration and increase in your sugar level.  We will let you know the results of metabolic panel when those are in.  Follow-up with your PCP as regular scheduled or as needed.

## 2018-02-17 NOTE — Patient Instructions (Addendum)
Clinically, you appear to be doing well post hospitalization for low-sodium.  It was recommended on discharge summary to repeat metabolic panel and I do agree that is a good idea.  You are already following recommendations regarding staying well-hydrated with sugar-free Gatorade.  Avoiding overhydration with plain water.  Also remember to minimize use of alcohol.  Alcohol could lead to dehydration and increase in your sugar level.  We will let you know the results of metabolic panel when those are in.  Follow-up with your PCP as regular scheduled or as needed.

## 2018-02-18 LAB — COMPREHENSIVE METABOLIC PANEL
AG RATIO: 2.1 (calc) (ref 1.0–2.5)
ALBUMIN MSPROF: 4.6 g/dL (ref 3.6–5.1)
ALT: 44 U/L (ref 9–46)
AST: 20 U/L (ref 10–35)
Alkaline phosphatase (APISO): 61 U/L (ref 40–115)
BILIRUBIN TOTAL: 0.3 mg/dL (ref 0.2–1.2)
BUN: 11 mg/dL (ref 7–25)
CALCIUM: 9.5 mg/dL (ref 8.6–10.3)
CO2: 27 mmol/L (ref 20–32)
Chloride: 98 mmol/L (ref 98–110)
Creat: 0.83 mg/dL (ref 0.70–1.25)
Globulin: 2.2 g/dL (calc) (ref 1.9–3.7)
Glucose, Bld: 122 mg/dL — ABNORMAL HIGH (ref 65–99)
POTASSIUM: 3.9 mmol/L (ref 3.5–5.3)
SODIUM: 132 mmol/L — AB (ref 135–146)
TOTAL PROTEIN: 6.8 g/dL (ref 6.1–8.1)

## 2018-02-22 ENCOUNTER — Other Ambulatory Visit: Payer: BC Managed Care – PPO

## 2018-03-06 ENCOUNTER — Other Ambulatory Visit: Payer: Self-pay | Admitting: Family Medicine

## 2018-03-23 NOTE — ED Provider Notes (Signed)
[facility] Provider Note   CSN: [id number]     History   Chief Complaint Chief Complaint  Patient presents with  . Low sodium    HPI Phillip Doyle is a 62 y.o. male.  HPI   Pt is a 62 yo male here with hyponatremia. Sent from PCP with noted hyponatremia to 111.  Patient repepat Na today is 120.  Patient has been drinking some excess water because of the heat, but is otherwise not been taking any new medications.  He is following a low Na diet until recently when his Na was noted to be low.   No new symptoms.   Past Medical History:  Diagnosis Date  . Alcohol abuse 11/28/2013  . ALLERGIC RHINITIS 02/15/2007  . Allergy    rhinitis  . Bronchitis 07/14/2012  . Diabetes mellitus   . DM 10/24/2010  . ED (erectile dysfunction) 02/17/2012  . EXOGENOUS OBESITY 02/15/2007  . H/O alcohol abuse 11/28/2013  . H/O tobacco use, presenting hazards to health 07/04/2010   Qualifier: Diagnosis of  By: [name] MD, Erline Levine  1/2 ppd   . HTN (hypertension) 08/20/2011  . Hypertension   . HYPERTENSION 02/15/2007  . Impaired glucose tolerance   . 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 Low testosterone level in male 08/01/2016  . LUMBAR STRAIN 10/17/2010  . Mixed hyperlipidemia 07/04/2010  . NISSEN FUNDOPLICATION, HX OF 0/26/3785  . OA (osteoarthritis) of knee 02/17/2012  . Obesity   . PEPTIC ULCER DISEASE 02/15/2007  . Personal history of colonic polyps 01/17/2013   colonoscoppy 2013, benign polyp advised repeat in 10 years  . Preventative health care 07/19/2012  . Restless sleeper 10/30/2016  . TOBACCO ABUSE 07/04/2010  . Ulcer 1982   peptic ulcer disease    Patient Active Problem List   Diagnosis Date Noted  . Alcohol abuse   . Pulmonary emphysema ([place])   . COPD mixed type ([place]) 08/15/2017  . Primary snoring 05/07/2017  . Hyponatremia 02/03/2017  . Restless sleeper 10/30/2016  . 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 H/O alcohol abuse 11/28/2013  .  Low back pain radiating to left leg 07/19/2013  . Personal history of colonic polyps 01/17/2013  . Dermatitis 11/17/2012  . Preventative health care 07/19/2012  . OA (osteoarthritis) of knee 02/17/2012  . ED (erectile dysfunction) 02/17/2012  . HTN (hypertension) 08/20/2011  . Diabetes mellitus type 2 in obese ([place]) 10/24/2010  . LUMBAR STRAIN 10/17/2010  . MIXED HYPERLIPIDEMIA 07/04/2010  . H/O tobacco use, presenting hazards to health 07/04/2010  . Obesity 02/15/2007  . Allergic rhinitis 02/15/2007  . PEPTIC ULCER DISEASE 02/15/2007  . NISSEN FUNDOPLICATION, HX OF 88/50/2774    Past Surgical History:  Procedure Laterality Date  . COLONOSCOPY  03/2012   Several hyperplastic polyps  . fundiplication for [facility] and reflux  1987  . negative stress test  2008  . ROTATOR CUFF REPAIR  2000  . VASECTOMY  1982        Home Medications    Prior to Admission medications   Medication Sig Start Date End Date Taking? Authorizing Provider  acetaminophen (TYLENOL) 650 MG CR tablet Take 1,300 mg by mouth 2 (two) times daily as needed for pain.   Yes [provider]  albuterol (PROVENTIL HFA;VENTOLIN HFA) 108 (90 Base) MCG/ACT inhaler Inhale 2 puffs into the lungs every 6 (six) hours as needed for wheezing or shortness of breath. 12/14/17  Yes Deneise Lever, MD  lisinopril (  PRINIVIL,ZESTRIL) 20 MG tablet Take 1 tablet (20 mg total) by mouth 2 (two) times daily. 02/04/18  Yes Mosie Lukes, MD  Menthol-Methyl Salicylate (MUSCLE RUB) 10-15 % CREA Apply 1 application topically as needed for muscle pain.   Yes [provider]  metFORMIN (GLUCOPHAGE) 1000 MG tablet TAKE 1 TABLET BY MOUTH 2 TIMES DAILY WITH A MEAL. 11/27/17  Yes Mosie Lukes, MD  metoprolol (TOPROL-XL) 200 MG 24 hr tablet Take 1 tablet (200 mg total) by mouth daily. 11/26/17  Yes Mosie Lukes, MD  Multiple Vitamins-Minerals (PA MENS 50 PLUS VITAPAK PO) Take 1 capsule by mouth daily.   Yes [provider]   NEEDLE, DISP, 22 G 22G X 1-1/2" MISC Inject 1 application into the muscle every 14 (fourteen) days. 02/09/17  Yes Mosie Lukes, MD  simvastatin (ZOCOR) 10 MG tablet TAKE 1 TABLET BY MOUTH EVERY DAY AT 6PM 01/08/18  Yes Mosie Lukes, MD  testosterone cypionate (DEPOTESTOSTERONE CYPIONATE) 200 MG/ML injection INJECT 0.5 MLS INTO THE MUSCLE ONCE FOR 1 DOSE 12/27/17  Yes [provider]  umeclidinium-vilanterol (ANORO ELLIPTA) 62.5-25 MCG/INH AEPB Inhale 1 puff into the lungs daily. 08/13/17  Yes Young, Tarri Fuller D, MD  vitamin B-12 (CYANOCOBALAMIN) 100 MCG tablet Take 100 mcg by mouth daily.   Yes [provider]  folic acid (FOLVITE) 1 MG tablet Take 1 tablet (1 mg total) by mouth daily. 02/11/18   Eugenie Filler, MD  glimepiride (AMARYL) 1 MG tablet TAKE 1 TABLET BY MOUTH EVERY DAY WITH BREAKFAST 03/08/18   Penni Homans A, MD  glucose blood test strip Check blood sugar twice daily. DX: E11.9 08/01/16   Mosie Lukes, MD  Lancets (FREESTYLE) lancets Check blood sugar twice daily.  DX E11.9 08/01/16   Mosie Lukes, MD  sitaGLIPtin (JANUVIA) 100 MG tablet Take 1 tablet (100 mg total) by mouth daily. 02/10/18   Mosie Lukes, MD  thiamine 100 MG tablet Take 1 tablet (100 mg total) by mouth daily. 02/11/18   Eugenie Filler, MD    Family History Family History  Problem Relation Age of Onset  . Asthma Mother   . Cancer Mother        uterine   . Alcohol abuse Father   . Stroke Maternal Grandmother        possibly  . Hypertension Maternal Grandfather   . Hypertension Brother   . Diabetes Brother   . Heart disease Brother        aortic valve replaced, CAD  . Cancer Brother        prostate  . Colon cancer Neg Hx     Social History Social History   Tobacco Use  . Smoking status: Former Smoker    Packs/day: 2.00    Years: 40.00    Pack years: 80.00    Types: Cigarettes    Last attempt to quit: 06/28/2014    Years since quitting: 3.7  . Smokeless tobacco:  Former Systems developer    Quit date: 03/18/2012  . Tobacco comment: quit 06/18/10- started back   Substance Use Topics  . Alcohol use: Yes    Alcohol/week: 1.8 - 2.4 oz    Types: 3 - 4 Cans of beer per week    Comment: 3-4 every other day  . Drug use: No     Allergies   Patient has no known allergies.   Review of Systems Review of Systems  Constitutional: Negative for activity change.  Respiratory:  Negative for shortness of breath.   Cardiovascular: Negative for chest pain.  Gastrointestinal: Negative for abdominal pain.  All other systems reviewed and are negative.    Physical Exam Updated Vital Signs BP 132/82 (BP Location: Left Arm)   Pulse 86   Temp 98.6 F (37 C) (Oral)   Resp 16   Ht 5\' 9"  (1.753 m)   Wt 107 kg (235 lb 14.4 oz)   SpO2 99%   BMI 34.84 kg/m   Physical Exam  Constitutional: He is oriented to person, place, and time. He appears well-nourished.  HENT:  Head: Normocephalic.  Eyes: Conjunctivae are normal.  Cardiovascular: Normal rate.  Pulmonary/Chest: Effort normal.  Neurological: He is oriented to person, place, and time.  Skin: Skin is warm and dry. He is not diaphoretic.  Psychiatric: He has a normal mood and affect. His behavior is normal.     ED Treatments / Results  Labs (all labs ordered are listed, but only abnormal results are displayed) Labs Reviewed  COMPREHENSIVE METABOLIC PANEL - Abnormal; Notable for the following components:      Result Value   Sodium 120 (*)    Chloride 83 (*)    Glucose, Bld 118 (*)    ALT 57 (*)    All other components within normal limits  CBC WITH DIFFERENTIAL/PLATELET - Abnormal; Notable for the following components:   RBC 3.99 (*)    HCT 34.9 (*)    MCHC 37.8 (*)    RDW 11.3 (*)    All other components within normal limits  HEMOGLOBIN A1C - Abnormal; Notable for the following components:   Hgb A1c MFr Bld 5.8 (*)    All other components within normal limits  OSMOLALITY, URINE - Abnormal; Notable for the  following components:   Osmolality, Ur 227 (*)    All other components within normal limits  OSMOLALITY - Abnormal; Notable for the following components:   Osmolality 254 (*)    All other components within normal limits  LIPID PANEL - Abnormal; Notable for the following components:   HDL 36 (*)    All other components within normal limits  CORTISOL-AM, BLOOD - Abnormal; Notable for the following components:   Cortisol - AM 5.3 (*)    All other components within normal limits  CBC - Abnormal; Notable for the following components:   RBC 3.72 (*)    Hemoglobin 12.2 (*)    HCT 33.9 (*)    All other components within normal limits  COMPREHENSIVE METABOLIC PANEL - Abnormal; Notable for the following components:   Sodium 129 (*)    Chloride 92 (*)    Glucose, Bld 121 (*)    ALT 53 (*)    All other components within normal limits  BASIC METABOLIC PANEL - Abnormal; Notable for the following components:   Sodium 128 (*)    Chloride 93 (*)    Glucose, Bld 159 (*)    Calcium 8.8 (*)    All other components within normal limits  URINALYSIS, ROUTINE W REFLEX MICROSCOPIC  NA AND K (SODIUM & POTASSIUM), RAND UR  TSH  SODIUM, URINE, RANDOM  CORTISOL  HIV ANTIBODY (ROUTINE TESTING)    EKG None  Radiology No results found.  Procedures Procedures (including critical care time)  Medications Ordered in ED Medications - No data to display   Initial Impression / Assessment and Plan / ED Course  I have reviewed the triage vital signs and the nursing notes.  Pertinent labs & imaging  results that were available during my care of the patient were reviewed by me and considered in my medical decision making (see chart for details).     Will admit for hyponatremia. Asymptomatic.   Final Clinical Impressions(s) / ED Diagnoses   Final diagnoses:  Diabetes mellitus type 2 in obese [facility])    ED Discharge Orders        Ordered    folic acid (FOLVITE) 1 MG tablet  Daily     02/10/18  1501    thiamine 100 MG tablet  Daily     02/10/18 1501    Increase activity slowly     02/10/18 1501    Diet Carb Modified  Status:  Canceled     02/10/18 1501    Diet Carb Modified    Comments:  1200cc/day fluid restrition   02/10/18 1503       Nolyn Eilert, Fredia Sorrow, MD 03/23/18 [phone]

## 2018-04-16 ENCOUNTER — Other Ambulatory Visit: Payer: Self-pay | Admitting: Family Medicine

## 2018-04-16 NOTE — Telephone Encounter (Signed)
Refill request for testosterone cypionate (DEPOTESTOSTERONE CYPIONATE) 200 MG/ML injection.   Last OV: 02/08/2018 Last RF: 12/27/2017

## 2018-05-11 ENCOUNTER — Encounter: Payer: Self-pay | Admitting: Family Medicine

## 2018-05-12 ENCOUNTER — Other Ambulatory Visit: Payer: Self-pay | Admitting: Family Medicine

## 2018-05-13 LAB — HM DIABETES EYE EXAM

## 2018-05-15 ENCOUNTER — Other Ambulatory Visit: Payer: Self-pay | Admitting: Family Medicine

## 2018-05-17 NOTE — Telephone Encounter (Signed)
Requesting:testosterone   Contract:no UDS:no Last OV:02/17/18 Next OV:08/17/18 Last Refill:04/19/18 #1 ml-0rf Database:   Please advise

## 2018-05-25 IMAGING — DX DG CHEST 2V
2 series · 2 of 2 positions shown · non-contrast
Comparison: Chest x-ray dated November 13, 2006.

CLINICAL DATA: History of COPD.  Currently asymptomatic.

EXAM:
CHEST - 2 VIEW

[chest pa]
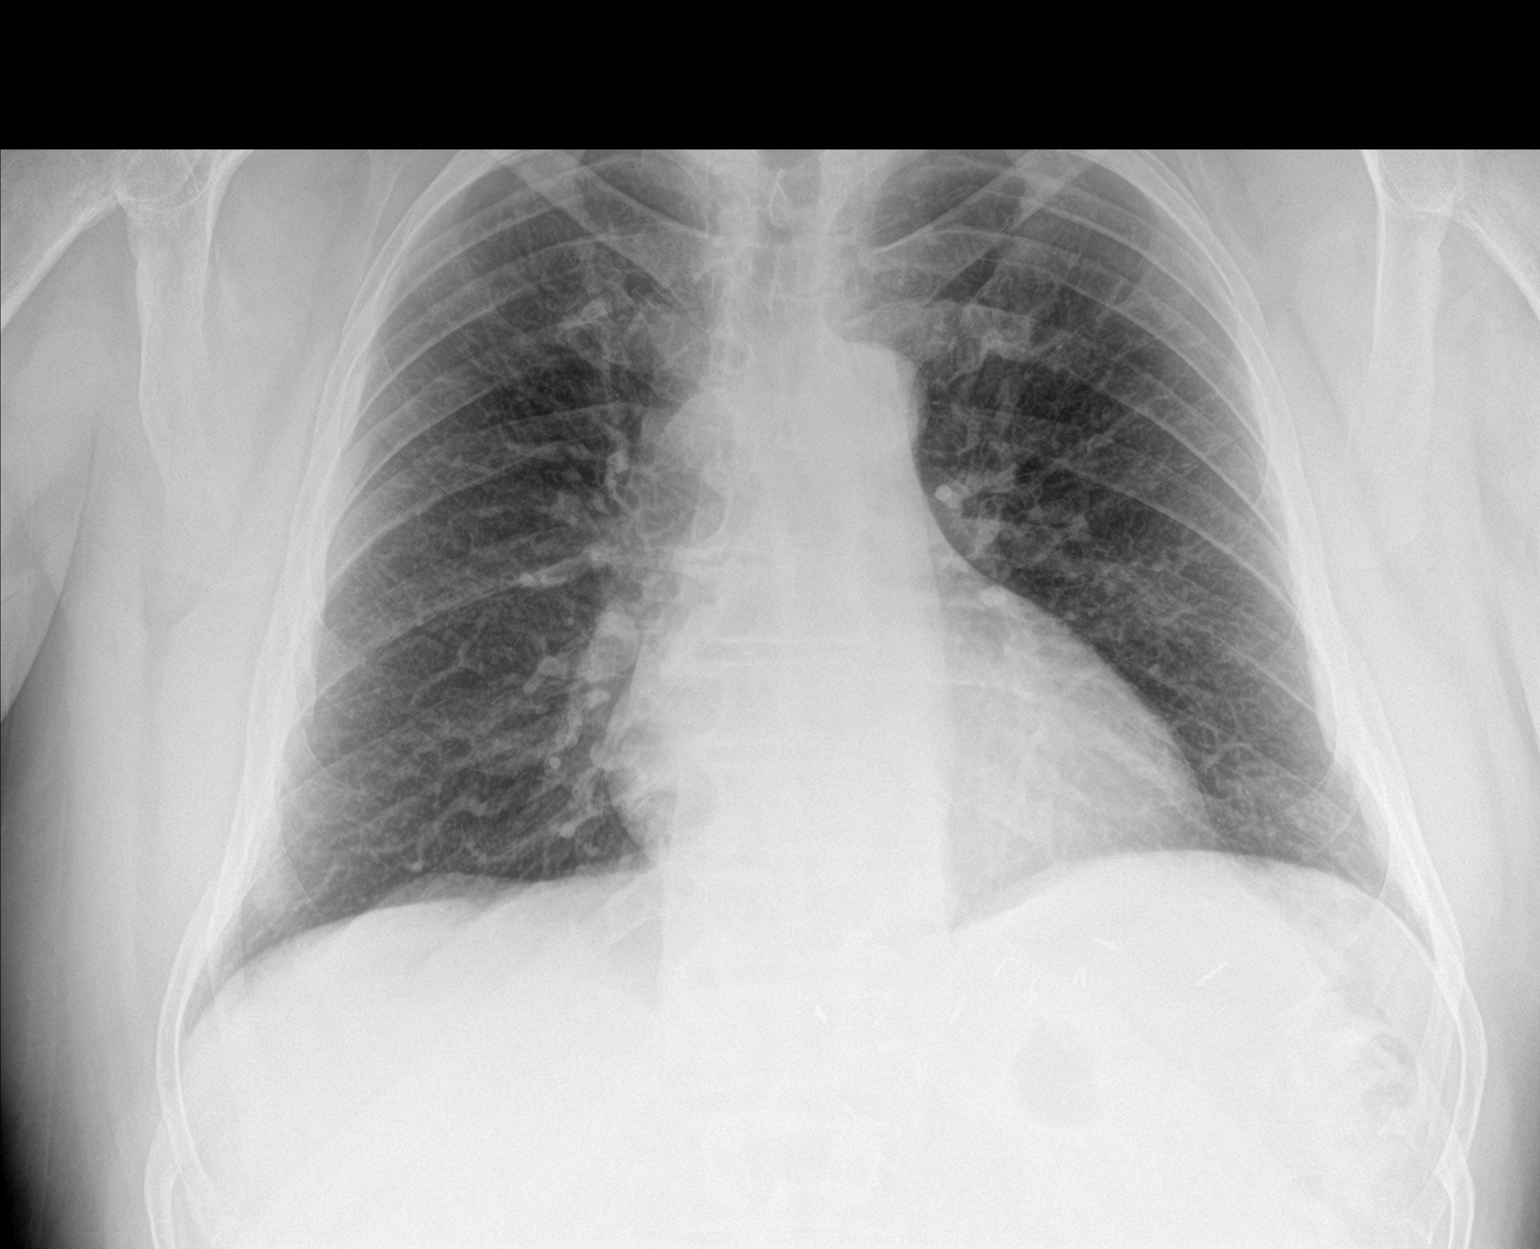

[chest lat]
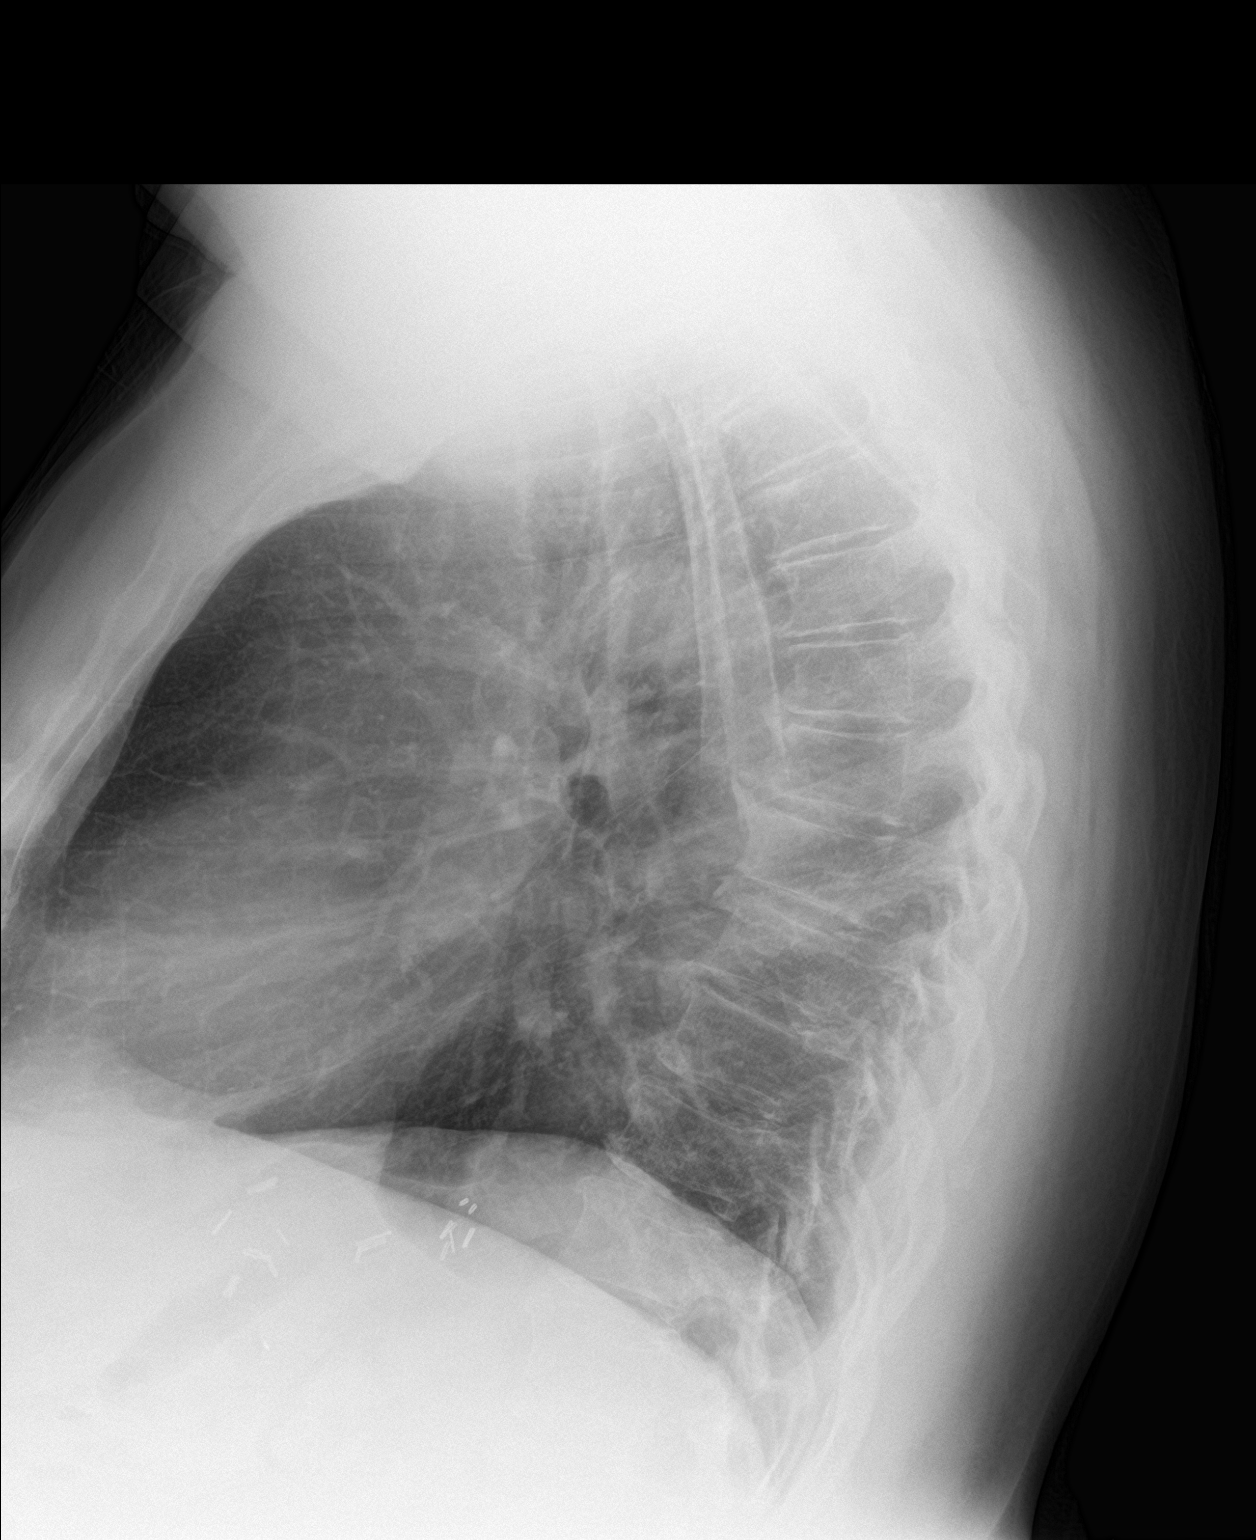

[2 of 2 positions shown; findings below may reference images not displayed]

FINDINGS: The heart size and mediastinal contours are within normal limits.
Normal pulmonary vascularity. No focal consolidation, pleural
effusion, or pneumothorax. No acute osseous abnormality. Unchanged
surgical clips near the gastroesophageal junction.
IMPRESSION: No active cardiopulmonary disease.

## 2018-06-22 ENCOUNTER — Other Ambulatory Visit: Payer: Self-pay | Admitting: Family Medicine

## 2018-06-25 ENCOUNTER — Encounter: Payer: Self-pay | Admitting: Family Medicine

## 2018-06-27 ENCOUNTER — Other Ambulatory Visit: Payer: Self-pay | Admitting: Family Medicine

## 2018-06-27 MED ORDER — MELOXICAM 15 MG PO TABS: 15.0000 mg | ORAL_TABLET | Freq: Every day | ORAL | 0 refills | Status: DC | PRN

## 2018-06-30 ENCOUNTER — Other Ambulatory Visit: Payer: Self-pay | Admitting: Family Medicine

## 2018-07-21 ENCOUNTER — Other Ambulatory Visit: Payer: Self-pay | Admitting: Family Medicine

## 2018-07-28 ENCOUNTER — Other Ambulatory Visit: Payer: Self-pay | Admitting: Family Medicine

## 2018-08-06 ENCOUNTER — Other Ambulatory Visit (INDEPENDENT_AMBULATORY_CARE_PROVIDER_SITE_OTHER): Payer: BC Managed Care – PPO

## 2018-08-06 DIAGNOSIS — E669 Obesity, unspecified: Secondary | ICD-10-CM

## 2018-08-06 DIAGNOSIS — R7989 Other specified abnormal findings of blood chemistry: Secondary | ICD-10-CM

## 2018-08-06 DIAGNOSIS — R972 Elevated prostate specific antigen [PSA]: Secondary | ICD-10-CM

## 2018-08-06 DIAGNOSIS — E782 Mixed hyperlipidemia: Secondary | ICD-10-CM

## 2018-08-06 DIAGNOSIS — E1169 Type 2 diabetes mellitus with other specified complication: Secondary | ICD-10-CM

## 2018-08-06 DIAGNOSIS — I10 Essential (primary) hypertension: Secondary | ICD-10-CM | POA: Diagnosis not present

## 2018-08-06 LAB — COMPREHENSIVE METABOLIC PANEL
ALT: 32 U/L (ref 0–53)
AST: 15 U/L (ref 0–37)
Albumin: 4.4 g/dL (ref 3.5–5.2)
Alkaline Phosphatase: 57 U/L (ref 39–117)
BUN: 12 mg/dL (ref 6–23)
CO2: 29 meq/L (ref 19–32)
Calcium: 9.5 mg/dL (ref 8.4–10.5)
Chloride: 90 mEq/L — ABNORMAL LOW (ref 96–112)
Creatinine, Ser: 0.93 mg/dL (ref 0.40–1.50)
GFR: 87.74 mL/min (ref >60.00)
GLUCOSE: 93 mg/dL (ref 70–99)
Potassium: 4.7 mEq/L (ref 3.5–5.1)
Sodium: 128 mEq/L — ABNORMAL LOW (ref 135–145)
Total Bilirubin: 0.4 mg/dL (ref 0.2–1.2)
Total Protein: 6.9 g/dL (ref 6.0–8.3)

## 2018-08-06 LAB — CBC
HEMATOCRIT: 37.2 % — AB (ref 39.0–52.0)
Hemoglobin: 13.1 g/dL (ref 13.0–17.0)
MCHC: 35.3 g/dL (ref 30.0–36.0)
MCV: 95.8 fl (ref 78.0–100.0)
Platelets: 358 10*3/uL (ref 150.0–400.0)
RBC: 3.88 Mil/uL — ABNORMAL LOW (ref 4.22–5.81)
RDW: 13 % (ref 11.5–15.5)
WBC: 7.1 10*3/uL (ref 4.0–10.5)

## 2018-08-06 LAB — PSA: PSA: 0.25 ng/mL (ref 0.10–4.00)

## 2018-08-06 LAB — LIPID PANEL
CHOL/HDL RATIO: 3
Cholesterol: 102 mg/dL (ref 0–200)
HDL: 38.1 mg/dL — ABNORMAL LOW (ref >39.00)
LDL CALC: 43 mg/dL (ref 0–99)
NonHDL: 64.06
Triglycerides: 106 mg/dL (ref 0.0–149.0)
VLDL: 21.2 mg/dL (ref 0.0–40.0)

## 2018-08-06 LAB — MICROALBUMIN / CREATININE URINE RATIO
Creatinine,U: 61.5 mg/dL
Microalb Creat Ratio: 3.8 mg/g (ref 0.0–30.0)
Microalb, Ur: 2.4 mg/dL — ABNORMAL HIGH (ref 0.0–1.9)

## 2018-08-06 LAB — TESTOSTERONE: Testosterone: 261.43 ng/dL — ABNORMAL LOW (ref 300.00–890.00)

## 2018-08-06 LAB — TSH: TSH: 1.17 u[IU]/mL (ref 0.35–4.50)

## 2018-08-06 LAB — HEMOGLOBIN A1C: Hgb A1c MFr Bld: 5.5 % (ref 4.6–6.5)

## 2018-08-16 ENCOUNTER — Other Ambulatory Visit: Payer: Self-pay | Admitting: Family Medicine

## 2018-08-17 ENCOUNTER — Ambulatory Visit (INDEPENDENT_AMBULATORY_CARE_PROVIDER_SITE_OTHER): Payer: BC Managed Care – PPO | Admitting: Family Medicine

## 2018-08-17 DIAGNOSIS — F1010 Alcohol abuse, uncomplicated: Secondary | ICD-10-CM

## 2018-08-17 DIAGNOSIS — I10 Essential (primary) hypertension: Secondary | ICD-10-CM | POA: Diagnosis not present

## 2018-08-17 DIAGNOSIS — E782 Mixed hyperlipidemia: Secondary | ICD-10-CM | POA: Diagnosis not present

## 2018-08-17 DIAGNOSIS — R7989 Other specified abnormal findings of blood chemistry: Secondary | ICD-10-CM

## 2018-08-17 DIAGNOSIS — E669 Obesity, unspecified: Secondary | ICD-10-CM

## 2018-08-17 DIAGNOSIS — E1169 Type 2 diabetes mellitus with other specified complication: Secondary | ICD-10-CM

## 2018-08-17 DIAGNOSIS — E6609 Other obesity due to excess calories: Secondary | ICD-10-CM

## 2018-08-17 DIAGNOSIS — E871 Hypo-osmolality and hyponatremia: Secondary | ICD-10-CM

## 2018-08-17 MED ORDER — MELOXICAM 15 MG PO TABS: ORAL_TABLET | ORAL | 1 refills | Status: DC

## 2018-08-17 MED ORDER — FREESTYLE LANCETS MISC: 6 refills | Status: DC

## 2018-08-17 MED ORDER — GLUCOSE BLOOD VI STRP: ORAL_STRIP | 6 refills | Status: DC

## 2018-08-17 MED ORDER — METOPROLOL SUCCINATE ER 200 MG PO TB24: 200.0000 mg | ORAL_TABLET | Freq: Every day | ORAL | 0 refills | Status: DC

## 2018-08-17 MED ORDER — METFORMIN HCL 1000 MG PO TABS: ORAL_TABLET | ORAL | 2 refills | Status: DC

## 2018-08-17 MED ORDER — "NEEDLE (DISP) 22G X 1-1/2"" MISC": 1.0000 "application " | 1 refills | Status: DC

## 2018-08-17 MED ORDER — TESTOSTERONE CYPIONATE 200 MG/ML IM SOLN: INTRAMUSCULAR | 0 refills | Status: DC

## 2018-08-17 NOTE — Assessment & Plan Note (Signed)
Encouraged complete cessation but is down to no more than 3 beers daily and is feeling better. If he quits he will call to have labs done to reasses his sodium

## 2018-08-17 NOTE — Assessment & Plan Note (Signed)
Well controlled, no changes to meds. Encouraged heart healthy diet such as the DASH diet and exercise as tolerated.  °

## 2018-08-17 NOTE — Assessment & Plan Note (Signed)
Encouraged DASH diet, decrease po intake and increase exercise as tolerated. Needs 7-8 hours of sleep nightly. Avoid trans fats, eat small, frequent meals every 4-5 hours with lean proteins, complex carbs and healthy fats. Minimize simple carbs 

## 2018-08-17 NOTE — Progress Notes (Signed)
Subjective:    Patient ID: Phillip Doyle, male    DOB: 09-07-1955, 62 y.o.   MRN: [id number]  No chief complaint on file.   HPI Patient is in today for follow-up and feels well.  No recent febrile illness or hospitalization.  He has become more active as [place].  He reports feeling his good as he is felt in years.  No acute concerns.  He is watching his diet and minimizing his carbohydrates.  He has dropped his alcohol consumption to no more than 3 beers daily and reports he feels better.  He denies any headaches or mental status changes.  No polyuria or polydipsia. Denies CP/palp/SOB/HA/congestion/fevers/GI or GU c/o. Taking meds as prescribed  Past Medical History:  Diagnosis Date  . Alcohol abuse 11/28/2013  . ALLERGIC RHINITIS 02/15/2007  . Allergy    rhinitis  . Bronchitis 07/14/2012  . Diabetes mellitus   . DM 10/24/2010  . ED (erectile dysfunction) 02/17/2012  . EXOGENOUS OBESITY 02/15/2007  . H/O alcohol abuse 11/28/2013  . H/O tobacco use, presenting hazards to health 07/04/2010   Qualifier: Diagnosis of  By: [name] MD, Erline Levine  1/2 ppd   . HTN (hypertension) 08/20/2011  . Hypertension   . HYPERTENSION 02/15/2007  . Impaired glucose tolerance   . 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 Low testosterone level in male 08/01/2016  . LUMBAR STRAIN 10/17/2010  . Mixed hyperlipidemia 07/04/2010  . NISSEN FUNDOPLICATION, HX OF 7/35/3299  . OA (osteoarthritis) of knee 02/17/2012  . Obesity   . PEPTIC ULCER DISEASE 02/15/2007  . Personal history of colonic polyps 01/17/2013   colonoscoppy 2013, benign polyp advised repeat in 10 years  . Preventative health care 07/19/2012  . Restless sleeper 10/30/2016  . TOBACCO ABUSE 07/04/2010  . Ulcer 1982   peptic ulcer disease    Past Surgical History:  Procedure Laterality Date  . COLONOSCOPY  03/2012   Several hyperplastic polyps  . fundiplication for [facility] and reflux  1987  . negative stress test  2008  . ROTATOR CUFF REPAIR   2000  . VASECTOMY  1982    Family History  Problem Relation Age of Onset  . Asthma Mother   . Cancer Mother        uterine   . Alcohol abuse Father   . Stroke Maternal Grandmother        possibly  . Hypertension Maternal Grandfather   . Hypertension Brother   . Diabetes Brother   . Heart disease Brother        aortic valve replaced, CAD  . Cancer Brother        prostate  . Colon cancer Neg Hx     Social History   Socioeconomic History  . Marital status: Married    Spouse name: [name]  . Number of children: Not on file  . Years of education: Not on file  . Highest education level: Not on file  Occupational History    Employer: [employer]  Social Needs  . Financial resource strain: Not on file  . Food insecurity:    Worry: Not on file    Inability: Not on file  . Transportation needs:    Medical: Not on file    Non-medical: Not on file  Tobacco Use  . Smoking status: Former Smoker    Packs/day: 2.00    Years: 40.00    Pack years: 80.00    Types: Cigarettes    Last  attempt to quit: 06/28/2014    Years since quitting: 4.1  . Smokeless tobacco: Former Systems developer    Quit date: 03/18/2012  . Tobacco comment: quit 06/18/10- started back   Substance and Sexual Activity  . Alcohol use: Yes    Alcohol/week: 3.0 - 4.0 standard drinks    Types: 3 - 4 Cans of beer per week    Comment: 3-4 every other day  . Drug use: No  . Sexual activity: Yes    Partners: Female  Lifestyle  . Physical activity:    Days per week: Not on file    Minutes per session: Not on file  . Stress: Not on file  Relationships  . Social connections:    Talks on phone: Not on file    Gets together: Not on file    Attends religious service: Not on file    Active member of club or organization: Not on file    Attends meetings of clubs or organizations: Not on file    Relationship status: Not on file  . Intimate partner violence:    Fear of current or ex partner: Not on file    Emotionally  abused: Not on file    Physically abused: Not on file    Forced sexual activity: Not on file  Other Topics Concern  . Not on file  Social History Narrative  . Not on file    Outpatient Medications Prior to Visit  Medication Sig Dispense Refill  . acetaminophen (TYLENOL) 650 MG CR tablet Take 1,300 mg by mouth 2 (two) times daily as needed for pain.    Marland Kitchen albuterol (PROVENTIL HFA;VENTOLIN HFA) 108 (90 Base) MCG/ACT inhaler Inhale 2 puffs into the lungs every 6 (six) hours as needed for wheezing or shortness of breath. 1 Inhaler 12  . folic acid (FOLVITE) 1 MG tablet Take 1 tablet (1 mg total) by mouth daily.    Marland Kitchen glimepiride (AMARYL) 1 MG tablet TAKE 1 TABLET BY MOUTH EVERY DAY WITH BREAKFAST 90 tablet 1  . JANUVIA 100 MG tablet TAKE 1 TABLET BY MOUTH EVERY DAY 90 tablet 1  . lisinopril-hydrochlorothiazide (PRINZIDE,ZESTORETIC) 20-12.5 MG tablet TAKE 1 TABLET BY MOUTH 2 TIMES DAILY 180 tablet 1  . Menthol-Methyl Salicylate (MUSCLE RUB) 10-15 % CREA Apply 1 application topically as needed for muscle pain.    . Multiple Vitamins-Minerals (PA MENS 50 PLUS VITAPAK PO) Take 1 capsule by mouth daily.    . simvastatin (ZOCOR) 10 MG tablet TAKE 1 TABLET BY MOUTH EVERY DAY AT 6PM 90 tablet 1  . thiamine 100 MG tablet Take 1 tablet (100 mg total) by mouth daily.    Marland Kitchen umeclidinium-vilanterol (ANORO ELLIPTA) 62.5-25 MCG/INH AEPB Inhale 1 puff into the lungs daily. 60 each 12  . vitamin B-12 (CYANOCOBALAMIN) 100 MCG tablet Take 100 mcg by mouth daily.    Marland Kitchen glucose blood test strip Check blood sugar twice daily. DX: E11.9 100 each 6  . Lancets (FREESTYLE) lancets Check blood sugar twice daily.  DX E11.9 100 each 6  . lisinopril (PRINIVIL,ZESTRIL) 20 MG tablet Take 1 tablet (20 mg total) by mouth 2 (two) times daily. 180 tablet 1  . meloxicam (MOBIC) 15 MG tablet TAKE 1 TABLET BY MOUTH EVERY DAY AS NEEDED FOR PAIN 30 tablet 0  . metFORMIN (GLUCOPHAGE) 1000 MG tablet TAKE 1 TABLET BY MOUTH 2 TIMES DAILY  WITH A MEAL. 180 tablet 2  . metoprolol (TOPROL-XL) 200 MG 24 hr tablet Take 1 tablet (200 mg  total) by mouth daily. 90 tablet 0  . NEEDLE, DISP, 22 G 22G X 1-1/2" MISC Inject 1 application into the muscle every 14 (fourteen) days. 100 each 1  . testosterone cypionate (DEPOTESTOSTERONE CYPIONATE) 200 MG/ML injection INJECT 0.5 MLS INTO THE MUSCLE ONCE FOR 1 DOSE 1 mL 0   No facility-administered medications prior to visit.     No Known Allergies  Review of Systems  Constitutional: Negative for chills, fever and malaise/fatigue.  HENT: Negative for congestion and hearing loss.   Eyes: Negative for discharge.  Respiratory: Negative for cough, sputum production and shortness of breath.   Cardiovascular: Negative for chest pain, palpitations and leg swelling.  Gastrointestinal: Negative for abdominal pain, blood in stool, constipation, diarrhea, heartburn, nausea and vomiting.  Genitourinary: Negative for dysuria, frequency, hematuria and urgency.  Musculoskeletal: Negative for back pain, falls and myalgias.  Skin: Negative for rash.  Neurological: Negative for dizziness, sensory change, loss of consciousness, weakness and headaches.  Endo/Heme/Allergies: Negative for environmental allergies. Does not bruise/bleed easily.  Psychiatric/Behavioral: Negative for depression and suicidal ideas. The patient is not nervous/anxious and does not have insomnia.        Objective:    Physical Exam Vitals signs and nursing note reviewed.  Constitutional:      General: He is not in acute distress.    Appearance: He is well-developed.  HENT:     Head: Normocephalic and atraumatic.     Nose: Nose normal.  Eyes:     General:        Right eye: No discharge.        Left eye: No discharge.  Neck:     Musculoskeletal: Normal range of motion and neck supple.  Cardiovascular:     Rate and Rhythm: Normal rate and regular rhythm.     Heart sounds: No murmur.  Pulmonary:     Effort: Pulmonary effort  is normal.     Breath sounds: Normal breath sounds.  Abdominal:     General: Bowel sounds are normal.     Palpations: Abdomen is soft.     Tenderness: There is no abdominal tenderness.  Skin:    General: Skin is warm and dry.  Neurological:     Mental Status: He is alert and oriented to person, place, and time.     BP (!) 102/58 (BP Location: Left Arm, Patient Position: Sitting, Cuff Size: Normal)   Pulse 76   Temp 98.3 F (36.8 C) (Oral)   Resp 18   Wt 236 lb 3.2 oz (107.1 kg)   SpO2 93%   BMI 34.88 kg/m  Wt Readings from Last 3 Encounters:  08/17/18 236 lb 3.2 oz (107.1 kg)  02/17/18 235 lb 12.8 oz (107 kg)  02/10/18 235 lb 14.4 oz (107 kg)     Lab Results  Component Value Date   WBC 7.1 08/06/2018   HGB 13.1 08/06/2018   HCT 37.2 (L) 08/06/2018   PLT 358.0 08/06/2018   GLUCOSE 93 08/06/2018   CHOL 102 08/06/2018   TRIG 106.0 08/06/2018   HDL 38.10 (L) 08/06/2018   LDLDIRECT 101.3 01/28/2008   LDLCALC 43 08/06/2018   ALT 32 08/06/2018   AST 15 08/06/2018   NA 128 (L) 08/06/2018   K 4.7 08/06/2018   CL 90 (L) 08/06/2018   CREATININE 0.93 08/06/2018   BUN 12 08/06/2018   CO2 29 08/06/2018   TSH 1.17 08/06/2018   PSA 0.25 08/06/2018   HGBA1C 5.5 08/06/2018   MICROALBUR 2.4 (  H) 08/06/2018    Lab Results  Component Value Date   TSH 1.17 08/06/2018   Lab Results  Component Value Date   WBC 7.1 08/06/2018   HGB 13.1 08/06/2018   HCT 37.2 (L) 08/06/2018   MCV 95.8 08/06/2018   PLT 358.0 08/06/2018   Lab Results  Component Value Date   NA 128 (L) 08/06/2018   K 4.7 08/06/2018   CO2 29 08/06/2018   GLUCOSE 93 08/06/2018   BUN 12 08/06/2018   CREATININE 0.93 08/06/2018   BILITOT 0.4 08/06/2018   ALKPHOS 57 08/06/2018   AST 15 08/06/2018   ALT 32 08/06/2018   PROT 6.9 08/06/2018   ALBUMIN 4.4 08/06/2018   CALCIUM 9.5 08/06/2018   ANIONGAP 6 02/10/2018   GFR 87.74 08/06/2018   Lab Results  Component Value Date   CHOL 102 08/06/2018   Lab  Results  Component Value Date   HDL 38.10 (L) 08/06/2018   Lab Results  Component Value Date   LDLCALC 43 08/06/2018   Lab Results  Component Value Date   TRIG 106.0 08/06/2018   Lab Results  Component Value Date   CHOLHDL 3 08/06/2018   Lab Results  Component Value Date   HGBA1C 5.5 08/06/2018       Assessment & Plan:   Problem List Items Addressed This Visit    Obesity (Chronic)    Encouraged DASH diet, decrease po intake and increase exercise as tolerated. Needs 7-8 hours of sleep nightly. Avoid trans fats, eat small, frequent meals every 4-5 hours with lean proteins, complex carbs and healthy fats. Minimize simple carbs.      Relevant Medications   metFORMIN (GLUCOPHAGE) 1000 MG tablet   MIXED HYPERLIPIDEMIA    Tolerating statin, encouraged heart healthy diet, avoid trans fats, minimize simple carbs and saturated fats. Increase exercise as tolerated      Relevant Medications   metoprolol (TOPROL-XL) 200 MG 24 hr tablet   Other Relevant Orders   Lipid panel   Diabetes mellitus type 2 in obese (HCC)    hgba1c acceptable, minimize simple carbs. Increase exercise as tolerated. Continue current meds      Relevant Medications   metFORMIN (GLUCOPHAGE) 1000 MG tablet   Other Relevant Orders   Hemoglobin A1c   HTN (hypertension)    Well controlled, no changes to meds. Encouraged heart healthy diet such as the DASH diet and exercise as tolerated.       Relevant Medications   metoprolol (TOPROL-XL) 200 MG 24 hr tablet   Other Relevant Orders   CBC   Comprehensive metabolic panel   TSH   Low testosterone in male    Supplement and monitor no changes      Relevant Orders   Testosterone   Hyponatremia    improved with diminished alcohol intake and asymptomatic. Encouraged complete cessation and recheck      Alcohol abuse    Encouraged complete cessation but is down to no more than 3 beers daily and is feeling better. If he quits he will call to have labs done  to reasses his sodium         I have discontinued [place] lisinopril. I am also having him maintain his Multiple Vitamins-Minerals (PA MENS 50 PLUS VITAPAK PO), vitamin B-12, umeclidinium-vilanterol, albuterol, acetaminophen, MUSCLE RUB, folic acid, thiamine, glimepiride, lisinopril-hydrochlorothiazide, simvastatin, JANUVIA, glucose blood, testosterone cypionate, metoprolol, metFORMIN, freestyle, NEEDLE (DISP) 22 G, and meloxicam.  Meds ordered this encounter  Medications  . glucose blood  test strip    Sig: Check blood sugar twice daily. DX: E11.9    Dispense:  100 each    Refill:  6  . testosterone cypionate (DEPOTESTOSTERONE CYPIONATE) 200 MG/ML injection    Sig: INJECT 0.5 MLS INTO THE MUSCLE ONCE FOR 1 DOSE    Dispense:  1 mL    Refill:  0    Not to exceed 5 additional fills before 10/16/2018  . metoprolol (TOPROL-XL) 200 MG 24 hr tablet    Sig: Take 1 tablet (200 mg total) by mouth daily.    Dispense:  90 tablet    Refill:  0  . metFORMIN (GLUCOPHAGE) 1000 MG tablet    Sig: TAKE 1 TABLET BY MOUTH 2 TIMES DAILY WITH A MEAL.    Dispense:  180 tablet    Refill:  2  . Lancets (FREESTYLE) lancets    Sig: Check blood sugar twice daily.  DX E11.9    Dispense:  100 each    Refill:  6  . NEEDLE, DISP, 22 G 22G X 1-1/2" MISC    Sig: Inject 1 application into the muscle every 14 (fourteen) days.    Dispense:  100 each    Refill:  1  . meloxicam (MOBIC) 15 MG tablet    Sig: TAKE 1 TABLET BY MOUTH EVERY DAY AS NEEDED FOR PAIN    Dispense:  90 tablet    Refill:  1     Penni Homans, MD

## 2018-08-17 NOTE — Assessment & Plan Note (Signed)
Supplement and monitor no changes

## 2018-08-17 NOTE — Assessment & Plan Note (Signed)
improved with diminished alcohol intake and asymptomatic. Encouraged complete cessation and recheck

## 2018-08-17 NOTE — Assessment & Plan Note (Signed)
hgba1c acceptable, minimize simple carbs. Increase exercise as tolerated. Continue current meds 

## 2018-08-17 NOTE — Assessment & Plan Note (Signed)
Tolerating statin, encouraged heart healthy diet, avoid trans fats, minimize simple carbs and saturated fats. Increase exercise as tolerated 

## 2018-08-17 NOTE — Patient Instructions (Signed)
shingrix is the new shingles shot, 2 shots over 2-6 months, check with insurance regarding coverage and document Hypertension Hypertension, commonly called high blood pressure, is when the force of blood pumping through the arteries is too strong. The arteries are the blood vessels that carry blood from the heart throughout the body. Hypertension forces the heart to work harder to pump blood and may cause arteries to become narrow or stiff. Having untreated or uncontrolled hypertension can cause heart attacks, strokes, kidney disease, and other problems. A blood pressure reading consists of a higher number over a lower number. Ideally, your blood pressure should be below 120/80. The first ("top") number is called the systolic pressure. It is a measure of the pressure in your arteries as your heart beats. The second ("bottom") number is called the diastolic pressure. It is a measure of the pressure in your arteries as the heart relaxes. What are the causes? The cause of this condition is not known. What increases the risk? Some risk factors for high blood pressure are under your control. Others are not. Factors you can change  Smoking.  Having type 2 diabetes mellitus, high cholesterol, or both.  Not getting enough exercise or physical activity.  Being overweight.  Having too much fat, sugar, calories, or salt (sodium) in your diet.  Drinking too much alcohol. Factors that are difficult or impossible to change  Having chronic kidney disease.  Having a family history of high blood pressure.  Age. Risk increases with age.  Race. You may be at higher risk if you are African-American.  Gender. Men are at higher risk than women before age 66. After age 87, women are at higher risk than men.  Having obstructive sleep apnea.  Stress. What are the signs or symptoms? Extremely high blood pressure (hypertensive crisis) may cause:  Headache.  Anxiety.  Shortness of  breath.  Nosebleed.  Nausea and vomiting.  Severe chest pain.  Jerky movements you cannot control (seizures). How is this diagnosed? This condition is diagnosed by measuring your blood pressure while you are seated, with your arm resting on a surface. The cuff of the blood pressure monitor will be placed directly against the skin of your upper arm at the level of your heart. It should be measured at least twice using the same arm. Certain conditions can cause a difference in blood pressure between your right and left arms. Certain factors can cause blood pressure readings to be lower or higher than normal (elevated) for a short period of time:  When your blood pressure is higher when you are in a health care provider's office than when you are at home, this is called white coat hypertension. Most people with this condition do not need medicines.  When your blood pressure is higher at home than when you are in a health care provider's office, this is called masked hypertension. Most people with this condition may need medicines to control blood pressure. If you have a high blood pressure reading during one visit or you have normal blood pressure with other risk factors:  You may be asked to return on a different day to have your blood pressure checked again.  You may be asked to monitor your blood pressure at home for 1 week or longer. If you are diagnosed with hypertension, you may have other blood or imaging tests to help your health care provider understand your overall risk for other conditions. How is this treated? This condition is treated by making healthy  lifestyle changes, such as eating healthy foods, exercising more, and reducing your alcohol intake. Your health care provider may prescribe medicine if lifestyle changes are not enough to get your blood pressure under control, and if:  Your systolic blood pressure is above 130.  Your diastolic blood pressure is above 80. Your  personal target blood pressure may vary depending on your medical conditions, your age, and other factors. Follow these instructions at home: Eating and drinking   Eat a diet that is high in fiber and potassium, and low in sodium, added sugar, and fat. An example eating plan is called the DASH (Dietary Approaches to Stop Hypertension) diet. To eat this way: ? Eat plenty of fresh fruits and vegetables. Try to fill half of your plate at each meal with fruits and vegetables. ? Eat whole grains, such as whole wheat pasta, brown rice, or whole grain bread. Fill about one quarter of your plate with whole grains. ? Eat or drink low-fat dairy products, such as skim milk or low-fat yogurt. ? Avoid fatty cuts of meat, processed or cured meats, and poultry with skin. Fill about one quarter of your plate with lean proteins, such as fish, chicken without skin, beans, eggs, and tofu. ? Avoid premade and processed foods. These tend to be higher in sodium, added sugar, and fat.  Reduce your daily sodium intake. Most people with hypertension should eat less than 1,500 mg of sodium a day.  Limit alcohol intake to no more than 1 drink a day for nonpregnant women and 2 drinks a day for men. One drink equals 12 oz of beer, 5 oz of wine, or 1 oz of hard liquor. Lifestyle   Work with your health care provider to maintain a healthy body weight or to lose weight. Ask what an ideal weight is for you.  Get at least 30 minutes of exercise that causes your heart to beat faster (aerobic exercise) most days of the week. Activities may include walking, swimming, or biking.  Include exercise to strengthen your muscles (resistance exercise), such as pilates or lifting weights, as part of your weekly exercise routine. Try to do these types of exercises for 30 minutes at least 3 days a week.  Do not use any products that contain nicotine or tobacco, such as cigarettes and e-cigarettes. If you need help quitting, ask your  health care provider.  Monitor your blood pressure at home as told by your health care provider.  Keep all follow-up visits as told by your health care provider. This is important. Medicines  Take over-the-counter and prescription medicines only as told by your health care provider. Follow directions carefully. Blood pressure medicines must be taken as prescribed.  Do not skip doses of blood pressure medicine. Doing this puts you at risk for problems and can make the medicine less effective.  Ask your health care provider about side effects or reactions to medicines that you should watch for. Contact a health care provider if:  You think you are having a reaction to a medicine you are taking.  You have headaches that keep coming back (recurring).  You feel dizzy.  You have swelling in your ankles.  You have trouble with your vision. Get help right away if:  You develop a severe headache or confusion.  You have unusual weakness or numbness.  You feel faint.  You have severe pain in your chest or abdomen.  You vomit repeatedly.  You have trouble breathing. Summary  Hypertension is when  the force of blood pumping through your arteries is too strong. If this condition is not controlled, it may put you at risk for serious complications.  Your personal target blood pressure may vary depending on your medical conditions, your age, and other factors. For most people, a normal blood pressure is less than 120/80.  Hypertension is treated with lifestyle changes, medicines, or a combination of both. Lifestyle changes include weight loss, eating a healthy, low-sodium diet, exercising more, and limiting alcohol. This information is not intended to replace advice given to you by your health care provider. Make sure you discuss any questions you have with your health care provider. Document Released: 08/04/2005 Document Revised: 07/02/2016 Document Reviewed: 07/02/2016 Elsevier  Interactive Patient Education  2019 [employer].

## 2018-09-02 ENCOUNTER — Other Ambulatory Visit: Payer: Self-pay | Admitting: Family Medicine

## 2018-09-18 ENCOUNTER — Other Ambulatory Visit: Payer: Self-pay | Admitting: Family Medicine

## 2018-09-20 NOTE — Telephone Encounter (Signed)
Requesting:testosterone cypionate (DEPOTESTOSTERONE CYPIONATE) Contract:no UDS:no Last OV:08/17/18 Next OV:02/22/19 Last Refill: 08/17/18     Please advise

## 2018-11-24 ENCOUNTER — Other Ambulatory Visit: Payer: Self-pay | Admitting: Family Medicine

## 2018-12-15 ENCOUNTER — Ambulatory Visit: Payer: BC Managed Care – PPO | Admitting: Internal Medicine

## 2018-12-28 ENCOUNTER — Other Ambulatory Visit: Payer: Self-pay | Admitting: Family Medicine

## 2019-01-19 ENCOUNTER — Other Ambulatory Visit: Payer: Self-pay | Admitting: Family Medicine

## 2019-01-25 ENCOUNTER — Other Ambulatory Visit: Payer: Self-pay | Admitting: Family Medicine

## 2019-02-04 ENCOUNTER — Encounter: Payer: Self-pay | Admitting: Family Medicine

## 2019-02-15 ENCOUNTER — Other Ambulatory Visit (INDEPENDENT_AMBULATORY_CARE_PROVIDER_SITE_OTHER): Payer: BC Managed Care – PPO

## 2019-02-15 ENCOUNTER — Other Ambulatory Visit: Payer: Self-pay

## 2019-02-15 DIAGNOSIS — E669 Obesity, unspecified: Secondary | ICD-10-CM

## 2019-02-15 DIAGNOSIS — E782 Mixed hyperlipidemia: Secondary | ICD-10-CM | POA: Diagnosis not present

## 2019-02-15 DIAGNOSIS — I10 Essential (primary) hypertension: Secondary | ICD-10-CM | POA: Diagnosis not present

## 2019-02-15 DIAGNOSIS — R7989 Other specified abnormal findings of blood chemistry: Secondary | ICD-10-CM | POA: Diagnosis not present

## 2019-02-15 DIAGNOSIS — E1169 Type 2 diabetes mellitus with other specified complication: Secondary | ICD-10-CM

## 2019-02-15 LAB — LIPID PANEL
Cholesterol: 103 mg/dL (ref 0–200)
HDL: 45.2 mg/dL (ref >39.00)
LDL Cholesterol: 43 mg/dL (ref 0–99)
NonHDL: 58.18
Total CHOL/HDL Ratio: 2
Triglycerides: 78 mg/dL (ref 0.0–149.0)
VLDL: 15.6 mg/dL (ref 0.0–40.0)

## 2019-02-15 LAB — COMPREHENSIVE METABOLIC PANEL
ALT: 43 U/L (ref 0–53)
AST: 21 U/L (ref 0–37)
Albumin: 4.5 g/dL (ref 3.5–5.2)
Alkaline Phosphatase: 63 U/L (ref 39–117)
BUN: 4 mg/dL — ABNORMAL LOW (ref 6–23)
CO2: 29 mEq/L (ref 19–32)
Calcium: 9.3 mg/dL (ref 8.4–10.5)
Chloride: 88 mEq/L — ABNORMAL LOW (ref 96–112)
Creatinine, Ser: 0.68 mg/dL (ref 0.40–1.50)
GFR: 118.27 mL/min (ref >60.00)
Glucose, Bld: 126 mg/dL — ABNORMAL HIGH (ref 70–99)
Potassium: 5.1 mEq/L (ref 3.5–5.1)
Sodium: 124 mEq/L — ABNORMAL LOW (ref 135–145)
Total Bilirubin: 0.8 mg/dL (ref 0.2–1.2)
Total Protein: 7 g/dL (ref 6.0–8.3)

## 2019-02-15 LAB — CBC
HCT: 39.5 % (ref 39.0–52.0)
Hemoglobin: 13.5 g/dL (ref 13.0–17.0)
MCHC: 34.3 g/dL (ref 30.0–36.0)
MCV: 97 fl (ref 78.0–100.0)
Platelets: 365 10*3/uL (ref 150.0–400.0)
RBC: 4.07 Mil/uL — ABNORMAL LOW (ref 4.22–5.81)
RDW: 13.5 % (ref 11.5–15.5)
WBC: 6.3 10*3/uL (ref 4.0–10.5)

## 2019-02-15 LAB — HEMOGLOBIN A1C: Hgb A1c MFr Bld: 5.5 % (ref 4.6–6.5)

## 2019-02-15 LAB — TSH: TSH: 0.91 u[IU]/mL (ref 0.35–4.50)

## 2019-02-15 LAB — TESTOSTERONE: Testosterone: 230.09 ng/dL — ABNORMAL LOW (ref 300.00–890.00)

## 2019-02-22 ENCOUNTER — Encounter: Payer: BC Managed Care – PPO | Admitting: Family Medicine

## 2019-02-24 ENCOUNTER — Ambulatory Visit (INDEPENDENT_AMBULATORY_CARE_PROVIDER_SITE_OTHER): Payer: BC Managed Care – PPO | Admitting: Family Medicine

## 2019-02-24 ENCOUNTER — Other Ambulatory Visit: Payer: Self-pay

## 2019-02-24 VITALS — BP 160/78 | HR 81 | Temp 98.2°F | Resp 18 | Wt 228.2 lb

## 2019-02-24 DIAGNOSIS — R7989 Other specified abnormal findings of blood chemistry: Secondary | ICD-10-CM

## 2019-02-24 DIAGNOSIS — R21 Rash and other nonspecific skin eruption: Secondary | ICD-10-CM

## 2019-02-24 DIAGNOSIS — Z0000 Encounter for general adult medical examination without abnormal findings: Secondary | ICD-10-CM | POA: Diagnosis not present

## 2019-02-24 DIAGNOSIS — E669 Obesity, unspecified: Secondary | ICD-10-CM

## 2019-02-24 DIAGNOSIS — Z87891 Personal history of nicotine dependence: Secondary | ICD-10-CM

## 2019-02-24 DIAGNOSIS — E782 Mixed hyperlipidemia: Secondary | ICD-10-CM

## 2019-02-24 DIAGNOSIS — E1169 Type 2 diabetes mellitus with other specified complication: Secondary | ICD-10-CM | POA: Diagnosis not present

## 2019-02-24 DIAGNOSIS — E871 Hypo-osmolality and hyponatremia: Secondary | ICD-10-CM | POA: Diagnosis not present

## 2019-02-24 DIAGNOSIS — I10 Essential (primary) hypertension: Secondary | ICD-10-CM | POA: Diagnosis not present

## 2019-02-24 DIAGNOSIS — L309 Dermatitis, unspecified: Secondary | ICD-10-CM

## 2019-02-24 MED ORDER — GLUCOSE BLOOD VI STRP: ORAL_STRIP | 99 refills | Status: DC

## 2019-02-24 MED ORDER — TESTOSTERONE CYPIONATE 200 MG/ML IM SOLN: INTRAMUSCULAR | 0 refills | Status: DC

## 2019-02-24 MED ORDER — TRIAMCINOLONE ACETONIDE 0.1 % EX CREA: 1.0000 "application " | TOPICAL_CREAM | Freq: Two times a day (BID) | CUTANEOUS | 0 refills | Status: DC

## 2019-02-24 MED ORDER — ALBUTEROL SULFATE HFA 108 (90 BASE) MCG/ACT IN AERS: 2.0000 | INHALATION_SPRAY | Freq: Four times a day (QID) | RESPIRATORY_TRACT | 12 refills | Status: DC | PRN

## 2019-02-24 NOTE — Patient Instructions (Addendum)
Shingrix is the new shingles shot 2 shots over 2-6 months later. Check with insurance regarding coverage and document and then call for nurse appt for first shot  Preventive Care 18-62 Years Old, Male Preventive care refers to lifestyle choices and visits with your health care provider that can promote health and wellness. This includes:  A yearly physical exam. This is also called an annual well check.  Regular dental and eye exams.  Immunizations.  Screening for certain conditions.  Healthy lifestyle choices, such as eating a healthy diet, getting regular exercise, not using drugs or products that contain nicotine and tobacco, and limiting alcohol use. What can I expect for my preventive care visit? Physical exam Your health care provider will check:  Height and weight. These may be used to calculate body mass index (BMI), which is a measurement that tells if you are at a healthy weight.  Heart rate and blood pressure.  Your skin for abnormal spots. Counseling Your health care provider may ask you questions about:  Alcohol, tobacco, and drug use.  Emotional well-being.  Home and relationship well-being.  Sexual activity.  Eating habits.  Work and work Statistician. What immunizations do I need?  Influenza (flu) vaccine  This is recommended every year. Tetanus, diphtheria, and pertussis (Tdap) vaccine  You may need a Td booster every 10 years. Varicella (chickenpox) vaccine  You may need this vaccine if you have not already been vaccinated. Zoster (shingles) vaccine  You may need this after age 23. Measles, mumps, and rubella (MMR) vaccine  You may need at least one dose of MMR if you were born in 1957 or later. You may also need a second dose. Pneumococcal conjugate (PCV13) vaccine  You may need this if you have certain conditions and were not previously vaccinated. Pneumococcal polysaccharide (PPSV23) vaccine  You may need one or two doses if you smoke  cigarettes or if you have certain conditions. Meningococcal conjugate (MenACWY) vaccine  You may need this if you have certain conditions. Hepatitis A vaccine  You may need this if you have certain conditions or if you travel or work in places where you may be exposed to hepatitis A. Hepatitis B vaccine  You may need this if you have certain conditions or if you travel or work in places where you may be exposed to hepatitis B. Haemophilus influenzae type b (Hib) vaccine  You may need this if you have certain risk factors. Human papillomavirus (HPV) vaccine  If recommended by your health care provider, you may need three doses over 6 months. You may receive vaccines as individual doses or as more than one vaccine together in one shot (combination vaccines). Talk with your health care provider about the risks and benefits of combination vaccines. What tests do I need? Blood tests  Lipid and cholesterol levels. These may be checked every 5 years, or more frequently if you are over 57 years old.  Hepatitis C test.  Hepatitis B test. Screening  Lung cancer screening. You may have this screening every year starting at age 38 if you have a 30-pack-year history of smoking and currently smoke or have quit within the past 15 years.  Prostate cancer screening. Recommendations will vary depending on your family history and other risks.  Colorectal cancer screening. All adults should have this screening starting at age 46 and continuing until age 28. Your health care provider may recommend screening at age 79 if you are at increased risk. You will have tests every  1-10 years, depending on your results and the type of screening test.  Diabetes screening. This is done by checking your blood sugar (glucose) after you have not eaten for a while (fasting). You may have this done every 1-3 years.  Sexually transmitted disease (STD) testing. Follow these instructions at home: Eating and drinking   Eat a diet that includes fresh fruits and vegetables, whole grains, lean protein, and low-fat dairy products.  Take vitamin and mineral supplements as recommended by your health care provider.  Do not drink alcohol if your health care provider tells you not to drink.  If you drink alcohol: ? Limit how much you have to 0-2 drinks a day. ? Be aware of how much alcohol is in your drink. In the [place] one drink equals one 12 oz bottle of beer (355 mL), one 5 oz glass of wine (148 mL), or one 1 oz glass of hard liquor (44 mL). Lifestyle  Take daily care of your teeth and gums.  Stay active. Exercise for at least 30 minutes on 5 or more days each week.  Do not use any products that contain nicotine or tobacco, such as cigarettes, e-cigarettes, and chewing tobacco. If you need help quitting, ask your health care provider.  If you are sexually active, practice safe sex. Use a condom or other form of protection to prevent STIs (sexually transmitted infections).  Talk with your health care provider about taking a low-dose aspirin every day starting at age 59. What's next?  Go to your health care provider once a year for a well check visit.  Ask your health care provider how often you should have your eyes and teeth checked.  Stay up to date on all vaccines. This information is not intended to replace advice given to you by your health care provider. Make sure you discuss any questions you have with your health care provider. Document Released: 08/31/2015 Document Revised: 07/29/2018 Document Reviewed: 07/29/2018 Elsevier Patient Education  2020 [employer].

## 2019-02-27 MED ORDER — TESTOSTERONE CYPIONATE 200 MG/ML IM SOLN: INTRAMUSCULAR | 0 refills | Status: DC

## 2019-02-27 NOTE — Assessment & Plan Note (Signed)
Testosterone remains low on current supplement but will continue at same dose to minimize risk

## 2019-02-27 NOTE — Assessment & Plan Note (Addendum)
Sugars well controlled with numbers 110 to 120 fasting lately. hgba1c acceptable, minimize simple carbs. Increase exercise as tolerated. Continue current meds

## 2019-02-27 NOTE — Assessment & Plan Note (Signed)
Well controlled, no changes to meds. Encouraged heart healthy diet such as the DASH diet and exercise as tolerated.  °

## 2019-02-27 NOTE — Assessment & Plan Note (Signed)
Patient encouraged to maintain heart healthy diet, regular exercise, adequate sleep. Consider daily probiotics. Take medications as prescribed. Labs ordered and reviewed 

## 2019-02-27 NOTE — Assessment & Plan Note (Signed)
Continues to abstain 

## 2019-02-27 NOTE — Assessment & Plan Note (Signed)
Asymptomatic and stable. Continue to monitor water intake and minimize alcohol intake

## 2019-02-27 NOTE — Assessment & Plan Note (Signed)
Bilateral legs, had improved but is now flared again. Referred to dermatology for further consideration

## 2019-02-27 NOTE — Progress Notes (Signed)
Subjective:    Patient ID: Phillip Doyle, male    DOB: 01/25/1956, 63 y.o.   MRN: [id number]  No chief complaint on file.   HPI Patient is in today for annual preventative exam and follow up on chronic medical concerns including diabetes, low testosterone, hypertension, hyperlipidemia and more. No recent febrile illness or hospitalizations. Is working at [employer] still and denies any recent febrile illness. No hospitalizations. Is still abstaining from smoking. Denies CP/palp/SOB/HA/congestion/fevers/GI or GU c/o. Taking meds as prescribed. He has had trouble with a rash on lower legs bilaterally and it has flared again recently. It is pruritic.   Past Medical History:  Diagnosis Date  . Alcohol abuse 11/28/2013  . ALLERGIC RHINITIS 02/15/2007  . Allergy    rhinitis  . Bronchitis 07/14/2012  . Diabetes mellitus   . DM 10/24/2010  . ED (erectile dysfunction) 02/17/2012  . EXOGENOUS OBESITY 02/15/2007  . H/O alcohol abuse 11/28/2013  . H/O tobacco use, presenting hazards to health 07/04/2010   Qualifier: Diagnosis of  By: [name] MD, Erline Levine  1/2 ppd   . HTN (hypertension) 08/20/2011  . Hypertension   . HYPERTENSION 02/15/2007  . Impaired glucose tolerance   . 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 Low testosterone level in male 08/01/2016  . LUMBAR STRAIN 10/17/2010  . Mixed hyperlipidemia 07/04/2010  . NISSEN FUNDOPLICATION, HX OF 1/42/3953  . OA (osteoarthritis) of knee 02/17/2012  . Obesity   . PEPTIC ULCER DISEASE 02/15/2007  . Personal history of colonic polyps 01/17/2013   colonoscoppy 2013, benign polyp advised repeat in 10 years  . Preventative health care 07/19/2012  . Restless sleeper 10/30/2016  . TOBACCO ABUSE 07/04/2010  . Ulcer 1982   peptic ulcer disease    Past Surgical History:  Procedure Laterality Date  . COLONOSCOPY  03/2012   Several hyperplastic polyps  . fundiplication for [facility] and reflux  1987  . negative stress test  2008  . ROTATOR CUFF REPAIR  2000  . VASECTOMY  1982     Family History  Problem Relation Age of Onset  . Asthma Mother   . Cancer Mother        uterine   . Alcohol abuse Father   . Stroke Maternal Grandmother        possibly  . Hypertension Maternal Grandfather   . Hypertension Brother   . Diabetes Brother   . Heart disease Brother        aortic valve replaced, CAD  . Cancer Brother        prostate  . Colon cancer Neg Hx     Social History   Socioeconomic History  . Marital status: Married    Spouse name: [name]  . Number of children: Not on file  . Years of education: Not on file  . Highest education level: Not on file  Occupational History    Employer: [employer]  Social Needs  . Financial resource strain: Not on file  . Food insecurity    Worry: Not on file    Inability: Not on file  . Transportation needs    Medical: Not on file    Non-medical: Not on file  Tobacco Use  . Smoking status: Former Smoker    Packs/day: 2.00    Years: 40.00    Pack years: 80.00    Types: Cigarettes    Quit date: 06/28/2014    Years since quitting: 4.6  . Smokeless tobacco: Former Systems developer    Quit date:  03/18/2012  . Tobacco comment: quit 06/18/10- started back   Substance and Sexual Activity  . Alcohol use: Yes    Alcohol/week: 3.0 - 4.0 standard drinks    Types: 3 - 4 Cans of beer per week    Comment: 3-4 every other day  . Drug use: No  . Sexual activity: Yes    Partners: Female  Lifestyle  . Physical activity    Days per week: Not on file    Minutes per session: Not on file  . Stress: Not on file  Relationships  . Social Herbalist on phone: Not on file    Gets together: Not on file    Attends religious service: Not on file    Active member of club or organization: Not on file    Attends meetings of clubs or organizations: Not on file    Relationship status: Not on file  . Intimate partner violence    Fear of current or ex partner: Not on file    Emotionally abused: Not on file    Physically abused: Not  on file    Forced sexual activity: Not on file  Other Topics Concern  . Not on file  Social History Narrative  . Not on file    Outpatient Medications Prior to Visit  Medication Sig Dispense Refill  . acetaminophen (TYLENOL) 650 MG CR tablet Take 1,300 mg by mouth 2 (two) times daily as needed for pain.    . folic acid (FOLVITE) 1 MG tablet Take 1 tablet (1 mg total) by mouth daily.    Marland Kitchen glimepiride (AMARYL) 1 MG tablet TAKE 1 TABLET BY MOUTH EVERY DAY WITH BREAKFAST 90 tablet 1  . JANUVIA 100 MG tablet TAKE 1 TABLET BY MOUTH EVERY DAY 90 tablet 1  . Lancets (FREESTYLE) lancets Check blood sugar twice daily.  DX E11.9 100 each 6  . lisinopril-hydrochlorothiazide (PRINZIDE,ZESTORETIC) 20-12.5 MG tablet TAKE 1 TABLET BY MOUTH 2 TIMES DAILY 180 tablet 1  . meloxicam (MOBIC) 15 MG tablet TAKE 1 TABLET BY MOUTH EVERY DAY AS NEEDED FOR PAIN 90 tablet 1  . Menthol-Methyl Salicylate (MUSCLE RUB) 10-15 % CREA Apply 1 application topically as needed for muscle pain.    . metFORMIN (GLUCOPHAGE) 1000 MG tablet TAKE 1 TABLET BY MOUTH 2 TIMES DAILY WITH A MEAL. 180 tablet 2  . metoprolol (TOPROL-XL) 200 MG 24 hr tablet TAKE 1 TABLET BY MOUTH EVERY DAY 90 tablet 0  . Multiple Vitamins-Minerals (PA MENS 50 PLUS VITAPAK PO) Take 1 capsule by mouth daily.    Marland Kitchen NEEDLE, DISP, 22 G 22G X 1-1/2" MISC Inject 1 application into the muscle every 14 (fourteen) days. 100 each 1  . simvastatin (ZOCOR) 10 MG tablet TAKE 1 TABLET BY MOUTH EVERY DAY AT 6PM 90 tablet 1  . thiamine 100 MG tablet Take 1 tablet (100 mg total) by mouth daily.    Marland Kitchen umeclidinium-vilanterol (ANORO ELLIPTA) 62.5-25 MCG/INH AEPB Inhale 1 puff into the lungs daily. 60 each 12  . vitamin B-12 (CYANOCOBALAMIN) 100 MCG tablet Take 100 mcg by mouth daily.    Marland Kitchen albuterol (PROVENTIL HFA;VENTOLIN HFA) 108 (90 Base) MCG/ACT inhaler Inhale 2 puffs into the lungs every 6 (six) hours as needed for wheezing or shortness of breath. 1 Inhaler 12  . glucose  blood test strip Check blood sugar twice daily. DX: E11.9 100 each 6  . testosterone cypionate (DEPOTESTOSTERONE CYPIONATE) 200 MG/ML injection INJECT 0.5 MLS INTO THE MUSCLE ONCE  FOR 1 DOSE 1 mL 0   No facility-administered medications prior to visit.     No Known Allergies  Review of Systems  Constitutional: Negative for chills, fever and malaise/fatigue.  HENT: Negative for congestion and hearing loss.   Eyes: Negative for discharge.  Respiratory: Negative for cough, sputum production and shortness of breath.   Cardiovascular: Negative for chest pain, palpitations and leg swelling.  Gastrointestinal: Negative for abdominal pain, blood in stool, constipation, diarrhea, heartburn, nausea and vomiting.  Genitourinary: Negative for dysuria, frequency, hematuria and urgency.  Musculoskeletal: Negative for back pain, falls and myalgias.  Skin: Positive for itching and rash.  Neurological: Negative for dizziness, sensory change, loss of consciousness, weakness and headaches.  Endo/Heme/Allergies: Negative for environmental allergies. Does not bruise/bleed easily.  Psychiatric/Behavioral: Negative for depression and suicidal ideas. The patient is not nervous/anxious and does not have insomnia.        Objective:    Physical Exam Vitals signs and nursing note reviewed.  Constitutional:      General: He is not in acute distress.    Appearance: Normal appearance. He is well-developed. He is obese. He is not ill-appearing.  HENT:     Head: Normocephalic and atraumatic.     Right Ear: Tympanic membrane, ear canal and external ear normal. There is no impacted cerumen.     Left Ear: Tympanic membrane, ear canal and external ear normal. There is no impacted cerumen.     Nose: Nose normal. No congestion.     Mouth/Throat:     Mouth: Mucous membranes are moist.  Eyes:     General:        Right eye: No discharge.        Left eye: No discharge.     Extraocular Movements: Extraocular  movements intact.     Conjunctiva/sclera: Conjunctivae normal.     Pupils: Pupils are equal, round, and reactive to light.  Neck:     Musculoskeletal: Normal range of motion and neck supple.  Cardiovascular:     Rate and Rhythm: Normal rate and regular rhythm.     Pulses: Normal pulses.     Heart sounds: Normal heart sounds. No murmur.  Pulmonary:     Effort: Pulmonary effort is normal. No respiratory distress.     Breath sounds: Normal breath sounds. No rhonchi.  Abdominal:     General: Bowel sounds are normal. There is no distension.     Palpations: Abdomen is soft. There is no mass.     Tenderness: There is no abdominal tenderness. There is no guarding.  Lymphadenopathy:     Cervical: No cervical adenopathy.  Skin:    General: Skin is warm and dry.     Findings: Erythema and rash present.     Comments: Erythematous rash bilateral lower extremities extending up to below the knees.   Neurological:     Mental Status: He is alert and oriented to person, place, and time.     Cranial Nerves: No cranial nerve deficit.     BP (!) 160/78 (BP Location: Left Arm, Patient Position: Sitting, Cuff Size: Normal)   Pulse 81   Temp 98.2 F (36.8 C) (Oral)   Resp 18   Wt 228 lb 3.2 oz (103.5 kg)   SpO2 98%   BMI 33.70 kg/m  Wt Readings from Last 3 Encounters:  02/24/19 228 lb 3.2 oz (103.5 kg)  08/17/18 236 lb 3.2 oz (107.1 kg)  02/17/18 235 lb 12.8 oz (107 kg)  Diabetic Foot Exam - Simple   No data filed     Lab Results  Component Value Date   WBC 6.3 02/15/2019   HGB 13.5 02/15/2019   HCT 39.5 02/15/2019   PLT 365.0 02/15/2019   GLUCOSE 126 (H) 02/15/2019   CHOL 103 02/15/2019   TRIG 78.0 02/15/2019   HDL 45.20 02/15/2019   LDLDIRECT 101.3 01/28/2008   LDLCALC 43 02/15/2019   ALT 43 02/15/2019   AST 21 02/15/2019   NA 124 (L) 02/15/2019   K 5.1 02/15/2019   CL 88 (L) 02/15/2019   CREATININE 0.68 02/15/2019   BUN 4 (L) 02/15/2019   CO2 29 02/15/2019   TSH 0.91  02/15/2019   PSA 0.25 08/06/2018   HGBA1C 5.5 02/15/2019   MICROALBUR 2.4 (H) 08/06/2018    Lab Results  Component Value Date   TSH 0.91 02/15/2019   Lab Results  Component Value Date   WBC 6.3 02/15/2019   HGB 13.5 02/15/2019   HCT 39.5 02/15/2019   MCV 97.0 02/15/2019   PLT 365.0 02/15/2019   Lab Results  Component Value Date   NA 124 (L) 02/15/2019   K 5.1 02/15/2019   CO2 29 02/15/2019   GLUCOSE 126 (H) 02/15/2019   BUN 4 (L) 02/15/2019   CREATININE 0.68 02/15/2019   BILITOT 0.8 02/15/2019   ALKPHOS 63 02/15/2019   AST 21 02/15/2019   ALT 43 02/15/2019   PROT 7.0 02/15/2019   ALBUMIN 4.5 02/15/2019   CALCIUM 9.3 02/15/2019   ANIONGAP 6 02/10/2018   GFR 118.27 02/15/2019   Lab Results  Component Value Date   CHOL 103 02/15/2019   Lab Results  Component Value Date   HDL 45.20 02/15/2019   Lab Results  Component Value Date   LDLCALC 43 02/15/2019   Lab Results  Component Value Date   TRIG 78.0 02/15/2019   Lab Results  Component Value Date   CHOLHDL 2 02/15/2019   Lab Results  Component Value Date   HGBA1C 5.5 02/15/2019       Assessment & Plan:   Problem List Items Addressed This Visit    MIXED HYPERLIPIDEMIA    Encouraged heart healthy diet, increase exercise, avoid trans fats, consider a krill oil cap daily. Tolerating Simvastatin.      H/O tobacco use, presenting hazards to health    Continues to abstain      Diabetes mellitus type 2 in obese ([place])    Sugars well controlled with numbers 110 to 120 fasting lately. hgba1c acceptable, minimize simple carbs. Increase exercise as tolerated. Continue current meds      HTN (hypertension)    Well controlled, no changes to meds. Encouraged heart healthy diet such as the DASH diet and exercise as tolerated.       Preventative health care    Patient encouraged to maintain heart healthy diet, regular exercise, adequate sleep. Consider daily probiotics. Take medications as prescribed. Labs  ordered and reviewed.       Dermatitis    Bilateral legs, had improved but is now flared again. Referred to dermatology for further consideration      Low testosterone in male    Testosterone remains low on current supplement but will continue at same dose to minimize risk      Hyponatremia    Asymptomatic and stable. Continue to monitor water intake and minimize alcohol intake       Other Visit Diagnoses    Rash    -  Primary  Relevant Orders   Ambulatory referral to Dermatology      I have changed Zailyn A. Oler's glucose blood and albuterol. I am also having him start on triamcinolone cream. Additionally, I am having him maintain his Multiple Vitamins-Minerals (PA MENS 50 PLUS VITAPAK PO), vitamin B-12, umeclidinium-vilanterol, acetaminophen, Muscle Rub, folic acid, thiamine, lisinopril-hydrochlorothiazide, metFORMIN, freestyle, NEEDLE (DISP) 22 G, meloxicam, glimepiride, simvastatin, Januvia, metoprolol, and testosterone cypionate.  Meds ordered this encounter  Medications  . glucose blood test strip    Sig: Check blood sugar twice daily. DX: E11.9    Dispense:  100 each    Refill:  99  . albuterol (VENTOLIN HFA) 108 (90 Base) MCG/ACT inhaler    Sig: Inhale 2 puffs into the lungs every 6 (six) hours as needed for wheezing or shortness of breath.    Dispense:  18 g    Refill:  12  . DISCONTD: testosterone cypionate (DEPOTESTOSTERONE CYPIONATE) 200 MG/ML injection    Sig: Inject 0.5 mLs into the muscle once dialy    Dispense:  200 mL    Refill:  0    Not to exceed 5 additional fills before 02/13/2019  . triamcinolone cream (KENALOG) 0.1 %    Sig: Apply 1 application topically 2 (two) times daily.    Dispense:  30 g    Refill:  0  . DISCONTD: testosterone cypionate (DEPOTESTOSTERONE CYPIONATE) 200 MG/ML injection    Sig: Inject 0.5MLS into the muscle once for 1 dose    Dispense:  1 mL    Refill:  0    Not to exceed 5 additional fills before 02/13/2019  . testosterone  cypionate (DEPOTESTOSTERONE CYPIONATE) 200 MG/ML injection    Sig: Inject 0.5MLS into the muscle once for 1 dose    Dispense:  1 mL    Refill:  0    Not to exceed 5 additional fills before 02/13/2019     Penni Homans, MD

## 2019-02-27 NOTE — Assessment & Plan Note (Addendum)
Encouraged heart healthy diet, increase exercise, avoid trans fats, consider a krill oil cap daily. Tolerating Simvastatin.  

## 2019-03-04 ENCOUNTER — Other Ambulatory Visit: Payer: Self-pay

## 2019-03-04 MED ORDER — TESTOSTERONE CYPIONATE 200 MG/ML IM SOLN: INTRAMUSCULAR | 0 refills | Status: DC

## 2019-03-10 ENCOUNTER — Other Ambulatory Visit: Payer: Self-pay | Admitting: Family Medicine

## 2019-03-10 ENCOUNTER — Encounter: Payer: Self-pay | Admitting: Family Medicine

## 2019-03-10 MED ORDER — LISINOPRIL-HYDROCHLOROTHIAZIDE 20-12.5 MG PO TABS: 1.0000 | ORAL_TABLET | Freq: Two times a day (BID) | ORAL | 1 refills | Status: DC

## 2019-03-10 MED ORDER — GLIMEPIRIDE 1 MG PO TABS: ORAL_TABLET | ORAL | 1 refills | Status: DC

## 2019-03-24 ENCOUNTER — Telehealth: Payer: Self-pay | Admitting: Family Medicine

## 2019-03-24 ENCOUNTER — Ambulatory Visit (INDEPENDENT_AMBULATORY_CARE_PROVIDER_SITE_OTHER): Payer: BC Managed Care – PPO | Admitting: Family Medicine

## 2019-03-24 ENCOUNTER — Other Ambulatory Visit: Payer: Self-pay

## 2019-03-24 DIAGNOSIS — F1010 Alcohol abuse, uncomplicated: Secondary | ICD-10-CM

## 2019-03-24 DIAGNOSIS — E119 Type 2 diabetes mellitus without complications: Secondary | ICD-10-CM

## 2019-03-24 DIAGNOSIS — I10 Essential (primary) hypertension: Secondary | ICD-10-CM | POA: Diagnosis not present

## 2019-03-24 DIAGNOSIS — R21 Rash and other nonspecific skin eruption: Secondary | ICD-10-CM | POA: Diagnosis not present

## 2019-03-24 DIAGNOSIS — E669 Obesity, unspecified: Secondary | ICD-10-CM

## 2019-03-24 DIAGNOSIS — E1169 Type 2 diabetes mellitus with other specified complication: Secondary | ICD-10-CM | POA: Diagnosis not present

## 2019-03-24 NOTE — Telephone Encounter (Signed)
Unable to leave confidential voicemail on machine to schedule 4 month f/u appointment.

## 2019-03-27 DIAGNOSIS — R21 Rash and other nonspecific skin eruption: Secondary | ICD-10-CM | POA: Insufficient documentation

## 2019-03-27 NOTE — Progress Notes (Signed)
Virtual Visit via Video Note  I connected with Phillip Doyle on 03/24/19 at  1:40 PM EDT by a video enabled telemedicine application and verified that I am speaking with the correct person using two identifiers.  Location: Patient: work Provider: office   I discussed the limitations of evaluation and management by telemedicine and the availability of in person appointments. The patient expressed understanding and agreed to proceed. Magdalene Molly, CMA was able to get patient set up on visit, video   Subjective:    Patient ID: Phillip Doyle, male    DOB: 10-Jun-1956, 63 y.o.   MRN: [id number]  No chief complaint on file.   HPI Patient is in today for follow up on a pruritic rash, diabetes and hypertension. No recent febrile illness or hospitalizations. Notes his rash is much better with current treatment but not completely resolved. No pruritic any longer. No polyuria or polydipsia. Denies CP/palp/SOB/HA/congestion/fevers/GI or GU c/o. Taking meds as prescribed  Past Medical History:  Diagnosis Date  . Alcohol abuse 11/28/2013  . ALLERGIC RHINITIS 02/15/2007  . Allergy    rhinitis  . Bronchitis 07/14/2012  . Diabetes mellitus   . DM 10/24/2010  . ED (erectile dysfunction) 02/17/2012  . EXOGENOUS OBESITY 02/15/2007  . H/O alcohol abuse 11/28/2013  . H/O tobacco use, presenting hazards to health 07/04/2010   Qualifier: Diagnosis of  By: [name] MD, Erline Levine  1/2 ppd   . HTN (hypertension) 08/20/2011  . Hypertension   . HYPERTENSION 02/15/2007  . Impaired glucose tolerance   . 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 Low testosterone level in male 08/01/2016  . LUMBAR STRAIN 10/17/2010  . Mixed hyperlipidemia 07/04/2010  . NISSEN FUNDOPLICATION, HX OF 0/04/3817  . OA (osteoarthritis) of knee 02/17/2012  . Obesity   . PEPTIC ULCER DISEASE 02/15/2007  . Personal history of colonic polyps 01/17/2013   colonoscoppy 2013, benign polyp advised repeat in 10 years  . Preventative health care 07/19/2012  .  Restless sleeper 10/30/2016  . TOBACCO ABUSE 07/04/2010  . Ulcer 1982   peptic ulcer disease    Past Surgical History:  Procedure Laterality Date  . COLONOSCOPY  03/2012   Several hyperplastic polyps  . fundiplication for [facility] and reflux  1987  . negative stress test  2008  . ROTATOR CUFF REPAIR  2000  . VASECTOMY  1982    Family History  Problem Relation Age of Onset  . Asthma Mother   . Cancer Mother        uterine   . Alcohol abuse Father   . Stroke Maternal Grandmother        possibly  . Hypertension Maternal Grandfather   . Hypertension Brother   . Diabetes Brother   . Heart disease Brother        aortic valve replaced, CAD  . Cancer Brother        prostate  . Colon cancer Neg Hx     Social History   Socioeconomic History  . Marital status: Married    Spouse name: [name]  . Number of children: Not on file  . Years of education: Not on file  . Highest education level: Not on file  Occupational History    Employer: [employer]  Social Needs  . Financial resource strain: Not on file  . Food insecurity    Worry: Not on file    Inability: Not on file  . Transportation needs    Medical: Not on file  Non-medical: Not on file  Tobacco Use  . Smoking status: Former Smoker    Packs/day: 2.00    Years: 40.00    Pack years: 80.00    Types: Cigarettes    Quit date: 06/28/2014    Years since quitting: 4.7  . Smokeless tobacco: Former Systems developer    Quit date: 03/18/2012  . Tobacco comment: quit 06/18/10- started back   Substance and Sexual Activity  . Alcohol use: Yes    Alcohol/week: 3.0 - 4.0 standard drinks    Types: 3 - 4 Cans of beer per week    Comment: 3-4 every other day  . Drug use: No  . Sexual activity: Yes    Partners: Female  Lifestyle  . Physical activity    Days per week: Not on file    Minutes per session: Not on file  . Stress: Not on file  Relationships  . Social Herbalist on phone: Not on file    Gets together: Not on file     Attends religious service: Not on file    Active member of club or organization: Not on file    Attends meetings of clubs or organizations: Not on file    Relationship status: Not on file  . Intimate partner violence    Fear of current or ex partner: Not on file    Emotionally abused: Not on file    Physically abused: Not on file    Forced sexual activity: Not on file  Other Topics Concern  . Not on file  Social History Narrative  . Not on file    Outpatient Medications Prior to Visit  Medication Sig Dispense Refill  . acetaminophen (TYLENOL) 650 MG CR tablet Take 1,300 mg by mouth 2 (two) times daily as needed for pain.    Marland Kitchen albuterol (VENTOLIN HFA) 108 (90 Base) MCG/ACT inhaler Inhale 2 puffs into the lungs every 6 (six) hours as needed for wheezing or shortness of breath. 18 g 12  . folic acid (FOLVITE) 1 MG tablet Take 1 tablet (1 mg total) by mouth daily.    Marland Kitchen glimepiride (AMARYL) 1 MG tablet TAKE 1 TABLET BY MOUTH EVERY DAY WITH BREAKFAST 90 tablet 1  . glucose blood test strip Check blood sugar twice daily. DX: E11.9 100 each 99  . JANUVIA 100 MG tablet TAKE 1 TABLET BY MOUTH EVERY DAY 90 tablet 1  . Lancets (FREESTYLE) lancets Check blood sugar twice daily.  DX E11.9 100 each 6  . lisinopril-hydrochlorothiazide (ZESTORETIC) 20-12.5 MG tablet Take 1 tablet by mouth 2 (two) times daily. 180 tablet 1  . meloxicam (MOBIC) 15 MG tablet TAKE 1 TABLET BY MOUTH EVERY DAY AS NEEDED FOR PAIN 90 tablet 1  . Menthol-Methyl Salicylate (MUSCLE RUB) 10-15 % CREA Apply 1 application topically as needed for muscle pain.    . metFORMIN (GLUCOPHAGE) 1000 MG tablet TAKE 1 TABLET BY MOUTH 2 TIMES DAILY WITH A MEAL. 180 tablet 2  . metoprolol (TOPROL-XL) 200 MG 24 hr tablet TAKE 1 TABLET BY MOUTH EVERY DAY 90 tablet 0  . Multiple Vitamins-Minerals (PA MENS 50 PLUS VITAPAK PO) Take 1 capsule by mouth daily.    Marland Kitchen NEEDLE, DISP, 22 G 22G X 1-1/2" MISC Inject 1 application into the muscle every 14  (fourteen) days. 100 each 1  . simvastatin (ZOCOR) 10 MG tablet TAKE 1 TABLET BY MOUTH EVERY DAY AT 6PM 90 tablet 1  . testosterone cypionate (DEPOTESTOSTERONE CYPIONATE) 200 MG/ML injection  Inject 0.5MLS into the muscle once for 1 dose 1 mL 0  . thiamine 100 MG tablet Take 1 tablet (100 mg total) by mouth daily.    Marland Kitchen triamcinolone cream (KENALOG) 0.1 % Apply 1 application topically 2 (two) times daily. 30 g 0  . umeclidinium-vilanterol (ANORO ELLIPTA) 62.5-25 MCG/INH AEPB Inhale 1 puff into the lungs daily. 60 each 12  . vitamin B-12 (CYANOCOBALAMIN) 100 MCG tablet Take 100 mcg by mouth daily.     No facility-administered medications prior to visit.     No Known Allergies  Review of Systems  Constitutional: Negative for fever and malaise/fatigue.  HENT: Negative for congestion.   Eyes: Negative for blurred vision.  Respiratory: Negative for shortness of breath.   Cardiovascular: Negative for chest pain, palpitations and leg swelling.  Gastrointestinal: Negative for abdominal pain, blood in stool and nausea.  Genitourinary: Negative for dysuria and frequency.  Musculoskeletal: Negative for falls.  Skin: Positive for rash.  Neurological: Negative for dizziness, loss of consciousness and headaches.  Endo/Heme/Allergies: Negative for environmental allergies.  Psychiatric/Behavioral: Negative for depression. The patient is not nervous/anxious.        Objective:    Physical Exam Constitutional:      Appearance: Normal appearance. He is not ill-appearing.  HENT:     Head: Normocephalic and atraumatic.  Eyes:     General:        Right eye: No discharge.        Left eye: No discharge.  Pulmonary:     Effort: Pulmonary effort is normal.  Neurological:     Mental Status: He is alert and oriented to person, place, and time.  Psychiatric:        Mood and Affect: Mood normal.        Behavior: Behavior normal.     BP 118/69 (BP Location: Left Arm, Patient Position: Sitting, Cuff  Size: Normal)   Pulse 81   Temp (!) 97.5 F (36.4 C) (Oral)   Wt 228 lb (103.4 kg)   BMI 33.67 kg/m  Wt Readings from Last 3 Encounters:  03/24/19 228 lb (103.4 kg)  02/24/19 228 lb 3.2 oz (103.5 kg)  08/17/18 236 lb 3.2 oz (107.1 kg)    Diabetic Foot Exam - Simple   No data filed     Lab Results  Component Value Date   WBC 6.3 02/15/2019   HGB 13.5 02/15/2019   HCT 39.5 02/15/2019   PLT 365.0 02/15/2019   GLUCOSE 126 (H) 02/15/2019   CHOL 103 02/15/2019   TRIG 78.0 02/15/2019   HDL 45.20 02/15/2019   LDLDIRECT 101.3 01/28/2008   LDLCALC 43 02/15/2019   ALT 43 02/15/2019   AST 21 02/15/2019   NA 124 (L) 02/15/2019   K 5.1 02/15/2019   CL 88 (L) 02/15/2019   CREATININE 0.68 02/15/2019   BUN 4 (L) 02/15/2019   CO2 29 02/15/2019   TSH 0.91 02/15/2019   PSA 0.25 08/06/2018   HGBA1C 5.5 02/15/2019   MICROALBUR 2.4 (H) 08/06/2018    Lab Results  Component Value Date   TSH 0.91 02/15/2019   Lab Results  Component Value Date   WBC 6.3 02/15/2019   HGB 13.5 02/15/2019   HCT 39.5 02/15/2019   MCV 97.0 02/15/2019   PLT 365.0 02/15/2019   Lab Results  Component Value Date   NA 124 (L) 02/15/2019   K 5.1 02/15/2019   CO2 29 02/15/2019   GLUCOSE 126 (H) 02/15/2019   BUN 4 (L)  02/15/2019   CREATININE 0.68 02/15/2019   BILITOT 0.8 02/15/2019   ALKPHOS 63 02/15/2019   AST 21 02/15/2019   ALT 43 02/15/2019   PROT 7.0 02/15/2019   ALBUMIN 4.5 02/15/2019   CALCIUM 9.3 02/15/2019   ANIONGAP 6 02/10/2018   GFR 118.27 02/15/2019   Lab Results  Component Value Date   CHOL 103 02/15/2019   Lab Results  Component Value Date   HDL 45.20 02/15/2019   Lab Results  Component Value Date   LDLCALC 43 02/15/2019   Lab Results  Component Value Date   TRIG 78.0 02/15/2019   Lab Results  Component Value Date   CHOLHDL 2 02/15/2019   Lab Results  Component Value Date   HGBA1C 5.5 02/15/2019       Assessment & Plan:   Problem List Items Addressed This  Visit    Diabetes mellitus type 2 in obese ([place])    hgba1c acceptable, minimize simple carbs. Increase exercise as tolerated. Continue current meds      HTN (hypertension)    Well controlled, no changes to meds. Encouraged heart healthy diet such as the DASH diet and exercise as tolerated.       Alcohol abuse    Has cut down dramatically and drinks infrequently at this time.       Rash    Has responded well to treatment and notes the steroid cream has nearly cleared up his rash and itching. He is allowed a refill.          I am having Mclean A. Girton maintain his Multiple Vitamins-Minerals (PA MENS 50 PLUS VITAPAK PO), vitamin B-12, umeclidinium-vilanterol, acetaminophen, Muscle Rub, folic acid, thiamine, metFORMIN, freestyle, NEEDLE (DISP) 22 G, simvastatin, Januvia, metoprolol, glucose blood, albuterol, triamcinolone cream, testosterone cypionate, meloxicam, lisinopril-hydrochlorothiazide, and glimepiride.  No orders of the defined types were placed in this encounter.    I discussed the assessment and treatment plan with the patient. The patient was provided an opportunity to ask questions and all were answered. The patient agreed with the plan and demonstrated an understanding of the instructions.   The patient was advised to call back or seek an in-person evaluation if the symptoms worsen or if the condition fails to improve as anticipated.  I provided 25 minutes of non-face-to-face time during this encounter.   Penni Homans, MD

## 2019-03-27 NOTE — Assessment & Plan Note (Signed)
hgba1c acceptable, minimize simple carbs. Increase exercise as tolerated. Continue current meds 

## 2019-03-27 NOTE — Assessment & Plan Note (Signed)
Has cut down dramatically and drinks infrequently at this time.

## 2019-03-27 NOTE — Assessment & Plan Note (Signed)
Well controlled, no changes to meds. Encouraged heart healthy diet such as the DASH diet and exercise as tolerated.  °

## 2019-03-27 NOTE — Assessment & Plan Note (Signed)
Has responded well to treatment and notes the steroid cream has nearly cleared up his rash and itching. He is allowed a refill.

## 2019-03-29 ENCOUNTER — Other Ambulatory Visit: Payer: Self-pay

## 2019-03-29 ENCOUNTER — Encounter: Payer: Self-pay | Admitting: Internal Medicine

## 2019-03-29 ENCOUNTER — Ambulatory Visit (INDEPENDENT_AMBULATORY_CARE_PROVIDER_SITE_OTHER): Payer: BC Managed Care – PPO | Admitting: Internal Medicine

## 2019-03-29 DIAGNOSIS — J449 Chronic obstructive pulmonary disease, unspecified: Secondary | ICD-10-CM | POA: Diagnosis not present

## 2019-03-29 DIAGNOSIS — R0683 Snoring: Secondary | ICD-10-CM

## 2019-03-29 NOTE — Patient Instructions (Signed)
Glad you are doing well - please call if we can help

## 2019-03-29 NOTE — Progress Notes (Signed)
HPI male former smoker followed for snoring complicated by  allergic rhinitis, DM 2, HBP, GERD/Nissen NPSG 07/26/06- AHI  16/ hr, desaturation to 81%, body weight  250 lbs HST 05/13/17-AHI 3.3/hour, desaturation to 81% with average 93%, body weight 231 pounds PFT 06/11/17-moderate obstructive airways disease with response to bronchodilator, mild restriction.  FVC 3.26/73%, FEV1 2.23/66%, ratio 1.68, TLC 74%, DLCO 108%  --------------------------------------------------------------------------- 12/14/2017- 63 year old male former smoker followed for COPD, complicated by snoring , allergic rhinitis, DM 2, HBP, GERD/Nissen ----4 month follow up for COPD. Patient states he has been feeling great since last visit. Denies any new concerns.  Anoro,  Anoro helps-he can tell he takes deeper breaths.  Some cough occasionally, dry, blamed on pollen.  Does not have a rescue inhaler. We discussed snoring control measures again including earplugs for his wife if these would help.  03/29/2019- 63 year old male former smoker followed for COPD, complicated by snoring , allergic rhinitis, DM 2, HBP, GERD/Nissen Albuterol hfa, Anoro,  Body weight 226 lbs -----pt states breathing is at baseline; states he has not had to use his maintenance or rescue inhaler  Covid careful- discussed Meds ok. Denies needed changes or acute events. CXR 12/14/17 No active cardiopulmonary disease.  ROS-see HPI   + = Positive Constitutional:    weight loss, night sweats, fevers, chills, fatigue, lassitude. HEENT:    headaches, difficulty swallowing, +tooth/dental problems, sore throat,       sneezing, itching, ear ache, nasal congestion, post nasal drip, snoring CV:    chest pain, orthopnea, PND, swelling in lower extremities, anasarca,                                                      dizziness, palpitations Resp:   shortness of breath with exertion or at rest.                productive cough,   +non-productive cough, coughing up  of blood.              change in color of mucus.  wheezing.   Skin:    rash or lesions. GI: +heartburn, indigestion, abdominal pain, nausea, vomiting, diarrhea,                 change in bowel habits, loss of appetite GU: dysuria, change in color of urine, no urgency or frequency.   flank pain. MS:   joint pain, stiffness, decreased range of motion, back pain. Neuro-     nothing unusual Psych:  change in mood or affect.  depression or anxiety.   memory loss.  OBJ- Physical Exam General- Alert, Oriented, Affect-appropriate, Distress- none acute, + overweight Skin- rash-none, lesions- none, excoriation- none Lymphadenopathy- none Head- atraumatic            Eyes- Gross vision intact, PERRLA, conjunctivae and secretions clear            Ears- Hearing, canals-normal            Nose- Clear, no-Septal dev, mucus, polyps, erosion, perforation             Throat- Mallampati IV , mucosa clear , drainage- none, tonsils- atrophic, + upper denture plate Neck- flexible , trachea midline, no stridor , thyroid nl, carotid no bruit Chest - symmetrical excursion , unlabored  Heart/CV- RRR , no murmur , no gallop  , no rub, nl s1 s2                           - JVD- none , edema- none, stasis changes- none, varices- none           Lung- clear to P&A, wheeze- none, cough- none , dullness-none, rub- none           Chest wall-  Abd-  Br/ Gen/ Rectal- Not done, not indicated Extrem- cyanosis- none, clubbing, none, atrophy- none, strength- nl Neuro- grossly intact to observation

## 2019-04-03 NOTE — Assessment & Plan Note (Signed)
Stable without exacerbation. Not needing inhalers now in mid-summer.  Maintaining Covid precautions- discussed

## 2019-04-03 NOTE — Assessment & Plan Note (Signed)
He denies progression of symptoms or complaints about his breathing at night from family

## 2019-04-06 ENCOUNTER — Other Ambulatory Visit: Payer: Self-pay

## 2019-04-06 ENCOUNTER — Encounter: Payer: Self-pay | Admitting: Family Medicine

## 2019-04-06 MED ORDER — TRIAMCINOLONE ACETONIDE 0.1 % EX CREA: 1.0000 "application " | TOPICAL_CREAM | Freq: Two times a day (BID) | CUTANEOUS | 0 refills | Status: DC

## 2019-04-07 ENCOUNTER — Other Ambulatory Visit: Payer: Self-pay

## 2019-04-07 ENCOUNTER — Other Ambulatory Visit (INDEPENDENT_AMBULATORY_CARE_PROVIDER_SITE_OTHER): Payer: BC Managed Care – PPO

## 2019-04-07 ENCOUNTER — Ambulatory Visit (INDEPENDENT_AMBULATORY_CARE_PROVIDER_SITE_OTHER): Payer: BC Managed Care – PPO | Admitting: *Deleted

## 2019-04-07 DIAGNOSIS — E669 Obesity, unspecified: Secondary | ICD-10-CM

## 2019-04-07 DIAGNOSIS — I10 Essential (primary) hypertension: Secondary | ICD-10-CM | POA: Diagnosis not present

## 2019-04-07 DIAGNOSIS — E1169 Type 2 diabetes mellitus with other specified complication: Secondary | ICD-10-CM

## 2019-04-07 DIAGNOSIS — E782 Mixed hyperlipidemia: Secondary | ICD-10-CM | POA: Diagnosis not present

## 2019-04-07 DIAGNOSIS — R7989 Other specified abnormal findings of blood chemistry: Secondary | ICD-10-CM | POA: Diagnosis not present

## 2019-04-07 DIAGNOSIS — Z23 Encounter for immunization: Secondary | ICD-10-CM | POA: Diagnosis not present

## 2019-04-07 LAB — CBC
HCT: 38.8 % — ABNORMAL LOW (ref 39.0–52.0)
Hemoglobin: 13.5 g/dL (ref 13.0–17.0)
MCHC: 34.9 g/dL (ref 30.0–36.0)
MCV: 96.9 fl (ref 78.0–100.0)
Platelets: 324 10*3/uL (ref 150.0–400.0)
RBC: 4 Mil/uL — ABNORMAL LOW (ref 4.22–5.81)
RDW: 13.2 % (ref 11.5–15.5)
WBC: 5.2 10*3/uL (ref 4.0–10.5)

## 2019-04-07 LAB — COMPREHENSIVE METABOLIC PANEL
ALT: 34 U/L (ref 0–53)
AST: 19 U/L (ref 0–37)
Albumin: 4.8 g/dL (ref 3.5–5.2)
Alkaline Phosphatase: 71 U/L (ref 39–117)
BUN: 8 mg/dL (ref 6–23)
CO2: 28 mEq/L (ref 19–32)
Calcium: 10 mg/dL (ref 8.4–10.5)
Chloride: 91 mEq/L — ABNORMAL LOW (ref 96–112)
Creatinine, Ser: 0.73 mg/dL (ref 0.40–1.50)
GFR: 108.93 mL/min (ref >60.00)
Glucose, Bld: 106 mg/dL — ABNORMAL HIGH (ref 70–99)
Potassium: 5 mEq/L (ref 3.5–5.1)
Sodium: 128 mEq/L — ABNORMAL LOW (ref 135–145)
Total Bilirubin: 0.5 mg/dL (ref 0.2–1.2)
Total Protein: 7.3 g/dL (ref 6.0–8.3)

## 2019-04-07 LAB — LIPID PANEL
Cholesterol: 114 mg/dL (ref 0–200)
HDL: 42.7 mg/dL
LDL Cholesterol: 50 mg/dL (ref 0–99)
NonHDL: 71.16
Total CHOL/HDL Ratio: 3
Triglycerides: 106 mg/dL (ref 0.0–149.0)
VLDL: 21.2 mg/dL (ref 0.0–40.0)

## 2019-04-07 LAB — TSH: TSH: 1.14 u[IU]/mL (ref 0.35–4.50)

## 2019-04-07 LAB — TESTOSTERONE: Testosterone: 202.55 ng/dL — ABNORMAL LOW (ref 300.00–890.00)

## 2019-04-07 LAB — HEMOGLOBIN A1C: Hgb A1c MFr Bld: 5.4 % (ref 4.6–6.5)

## 2019-04-07 NOTE — Progress Notes (Signed)
Patient is here for flu vaccine.  Vaccine given and patient tolerated well.

## 2019-04-20 ENCOUNTER — Other Ambulatory Visit: Payer: Self-pay | Admitting: Family Medicine

## 2019-04-25 ENCOUNTER — Other Ambulatory Visit: Payer: Self-pay | Admitting: Family Medicine

## 2019-04-27 ENCOUNTER — Other Ambulatory Visit: Payer: Self-pay | Admitting: Family Medicine

## 2019-04-28 ENCOUNTER — Encounter: Payer: Self-pay | Admitting: Family Medicine

## 2019-04-28 MED ORDER — METOPROLOL SUCCINATE ER 200 MG PO TB24: 200.0000 mg | ORAL_TABLET | Freq: Every day | ORAL | 0 refills | Status: DC

## 2019-05-06 ENCOUNTER — Other Ambulatory Visit: Payer: Self-pay | Admitting: *Deleted

## 2019-05-06 MED ORDER — SITAGLIPTIN PHOSPHATE 100 MG PO TABS: 100.0000 mg | ORAL_TABLET | Freq: Every day | ORAL | 1 refills | Status: DC

## 2019-08-01 ENCOUNTER — Other Ambulatory Visit: Payer: Self-pay | Admitting: Family Medicine

## 2019-09-08 ENCOUNTER — Other Ambulatory Visit: Payer: Self-pay | Admitting: Family Medicine

## 2019-09-11 ENCOUNTER — Other Ambulatory Visit: Payer: Self-pay | Admitting: Family Medicine

## 2019-10-30 ENCOUNTER — Other Ambulatory Visit: Payer: Self-pay | Admitting: Family Medicine

## 2019-12-06 ENCOUNTER — Other Ambulatory Visit: Payer: Self-pay | Admitting: Family Medicine

## 2019-12-08 ENCOUNTER — Other Ambulatory Visit: Payer: Self-pay | Admitting: Family Medicine

## 2020-01-11 ENCOUNTER — Other Ambulatory Visit: Payer: Self-pay | Admitting: Family Medicine

## 2020-01-14 ENCOUNTER — Other Ambulatory Visit: Payer: Self-pay | Admitting: Family Medicine

## 2020-03-02 ENCOUNTER — Telehealth: Payer: Self-pay | Admitting: Family Medicine

## 2020-03-02 NOTE — Telephone Encounter (Signed)
Patient has been scheduled

## 2020-03-02 NOTE — Telephone Encounter (Signed)
Phillip Doyle -- can you assist this pt with an appt for [name]?

## 2020-03-16 ENCOUNTER — Other Ambulatory Visit: Payer: Self-pay | Admitting: Family Medicine

## 2020-03-16 ENCOUNTER — Encounter: Payer: Self-pay | Admitting: Internal Medicine

## 2020-03-30 ENCOUNTER — Ambulatory Visit: Payer: BC Managed Care – PPO | Admitting: Internal Medicine

## 2020-04-12 ENCOUNTER — Other Ambulatory Visit: Payer: Self-pay | Admitting: Family Medicine

## 2020-04-26 ENCOUNTER — Encounter: Payer: Self-pay | Admitting: Family Medicine

## 2020-04-26 ENCOUNTER — Telehealth: Payer: Self-pay | Admitting: *Deleted

## 2020-04-26 NOTE — Telephone Encounter (Signed)
Patient brought in note  FYI:  Broke his right wrist and had surgery 8/30 Dr. Apolonio Schneiders.  Post surgery first visit Friday.  Ill ask them to send you notes.

## 2020-05-15 ENCOUNTER — Telehealth: Payer: BC Managed Care – PPO | Admitting: Family Medicine

## 2020-05-16 LAB — HM DIABETES EYE EXAM

## 2020-05-26 ENCOUNTER — Other Ambulatory Visit: Payer: Self-pay | Admitting: Family Medicine

## 2020-05-29 ENCOUNTER — Other Ambulatory Visit: Payer: Self-pay | Admitting: Family Medicine

## 2020-05-30 ENCOUNTER — Other Ambulatory Visit: Payer: Self-pay | Admitting: Family Medicine

## 2020-06-01 ENCOUNTER — Other Ambulatory Visit: Payer: Self-pay | Admitting: Family Medicine

## 2020-06-03 ENCOUNTER — Other Ambulatory Visit: Payer: Self-pay | Admitting: Family Medicine

## 2020-06-15 ENCOUNTER — Telehealth (INDEPENDENT_AMBULATORY_CARE_PROVIDER_SITE_OTHER): Payer: BC Managed Care – PPO | Admitting: Family Medicine

## 2020-06-15 ENCOUNTER — Other Ambulatory Visit: Payer: Self-pay

## 2020-06-15 VITALS — BP 137/79 | HR 98 | Wt 232.0 lb

## 2020-06-15 DIAGNOSIS — E669 Obesity, unspecified: Secondary | ICD-10-CM

## 2020-06-15 DIAGNOSIS — N529 Male erectile dysfunction, unspecified: Secondary | ICD-10-CM | POA: Diagnosis not present

## 2020-06-15 DIAGNOSIS — I10 Essential (primary) hypertension: Secondary | ICD-10-CM

## 2020-06-15 DIAGNOSIS — R7989 Other specified abnormal findings of blood chemistry: Secondary | ICD-10-CM

## 2020-06-15 DIAGNOSIS — E871 Hypo-osmolality and hyponatremia: Secondary | ICD-10-CM

## 2020-06-15 DIAGNOSIS — E1169 Type 2 diabetes mellitus with other specified complication: Secondary | ICD-10-CM | POA: Diagnosis not present

## 2020-06-15 DIAGNOSIS — E782 Mixed hyperlipidemia: Secondary | ICD-10-CM

## 2020-06-15 NOTE — Patient Instructions (Signed)
cpe in feb

## 2020-06-17 NOTE — Progress Notes (Signed)
Virtual Visit via Phone Note  I connected with Phillip Doyle on 06/15/20 at  8:40 AM EDT by a phone enabled telemedicine application and verified that I am speaking with the correct person using two identifiers.  Location: Patient: home, patient, wife and provider are in the visit Provider: home   I discussed the limitations of evaluation and management by telemedicine and the availability of in person appointments. The patient expressed understanding and agreed to proceed. A Mikey Bussing, CMA was able to get the patient set up on a phone visit after being unable to set up a video visit    Subjective:    Patient ID: Phillip Doyle, male    DOB: Nov 21, 1955, 64 y.o.   MRN: [id number]  Chief Complaint  Patient presents with  . Follow-up    HPI Patient is in today for follow up on chronic medical concerns. No recent febrile illness or hospitalizations. He broke his arm and has been out of work and under going PT after surgery. He is doing much better and is scheduled to return ot work next week. His sugars have been well controlled. He had the Wynetta Emery and [place] vaccine on October 27, 2019 and plans to get a booster. Denies CP/palp/SOB/HA/congestion/fevers/GI or GU c/o. Taking meds as prescribed  Past Medical History:  Diagnosis Date  . Alcohol abuse 11/28/2013  . ALLERGIC RHINITIS 02/15/2007  . Allergy    rhinitis  . Bronchitis 07/14/2012  . Diabetes mellitus   . DM 10/24/2010  . ED (erectile dysfunction) 02/17/2012  . EXOGENOUS OBESITY 02/15/2007  . H/O alcohol abuse 11/28/2013  . H/O tobacco use, presenting hazards to health 07/04/2010   Qualifier: Diagnosis of  By: [name] MD, Erline Levine  1/2 ppd   . HTN (hypertension) 08/20/2011  . Hypertension   . HYPERTENSION 02/15/2007  . Impaired glucose tolerance   . 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 Low testosterone level in male 08/01/2016  . LUMBAR STRAIN 10/17/2010  . Mixed hyperlipidemia 07/04/2010  . NISSEN FUNDOPLICATION, HX OF 6/94/8546  . OA  (osteoarthritis) of knee 02/17/2012  . Obesity   . PEPTIC ULCER DISEASE 02/15/2007  . Personal history of colonic polyps 01/17/2013   colonoscoppy 2013, benign polyp advised repeat in 10 years  . Preventative health care 07/19/2012  . Restless sleeper 10/30/2016  . TOBACCO ABUSE 07/04/2010  . Ulcer 1982   peptic ulcer disease    Past Surgical History:  Procedure Laterality Date  . COLONOSCOPY  03/2012   Several hyperplastic polyps  . fundiplication for [facility] and reflux  1987  . negative stress test  2008  . ROTATOR CUFF REPAIR  2000  . VASECTOMY  1982    Family History  Problem Relation Age of Onset  . Asthma Mother   . Cancer Mother        uterine   . Alcohol abuse Father   . Stroke Maternal Grandmother        possibly  . Hypertension Maternal Grandfather   . Hypertension Brother   . Diabetes Brother   . Heart disease Brother        aortic valve replaced, CAD  . Cancer Brother        prostate  . Colon cancer Neg Hx     Social History   Socioeconomic History  . Marital status: Married    Spouse name: [name]  . Number of children: Not on file  . Years of education: Not on file  . Highest education level: Not  on file  Occupational History    Employer: [employer]  Tobacco Use  . Smoking status: Former Smoker    Packs/day: 2.00    Years: 40.00    Pack years: 80.00    Types: Cigarettes    Quit date: 06/28/2014    Years since quitting: 5.9  . Smokeless tobacco: Former Systems developer    Quit date: 03/18/2012  . Tobacco comment: quit 06/18/10- started back   Vaping Use  . Vaping Use: Never assessed  Substance and Sexual Activity  . Alcohol use: Yes    Alcohol/week: 3.0 - 4.0 standard drinks    Types: 3 - 4 Cans of beer per week    Comment: 3-4 every other day  . Drug use: No  . Sexual activity: Yes    Partners: Female  Other Topics Concern  . Not on file  Social History Narrative  . Not on file   Social Determinants of Health   Financial Resource Strain:   .  Difficulty of Paying Living Expenses: Not on file  Food Insecurity:   . Worried About Charity fundraiser in the Last Year: Not on file  . Ran Out of Food in the Last Year: Not on file  Transportation Needs:   . Lack of Transportation (Medical): Not on file  . Lack of Transportation (Non-Medical): Not on file  Physical Activity:   . Days of Exercise per Week: Not on file  . Minutes of Exercise per Session: Not on file  Stress:   . Feeling of Stress : Not on file  Social Connections:   . Frequency of Communication with Friends and Family: Not on file  . Frequency of Social Gatherings with Friends and Family: Not on file  . Attends Religious Services: Not on file  . Active Member of Clubs or Organizations: Not on file  . Attends Archivist Meetings: Not on file  . Marital Status: Not on file  Intimate Partner Violence:   . Fear of Current or Ex-Partner: Not on file  . Emotionally Abused: Not on file  . Physically Abused: Not on file  . Sexually Abused: Not on file    Outpatient Medications Prior to Visit  Medication Sig Dispense Refill  . acetaminophen (TYLENOL) 650 MG CR tablet Take 1,300 mg by mouth 2 (two) times daily as needed for pain.    Marland Kitchen albuterol (VENTOLIN HFA) 108 (90 Base) MCG/ACT inhaler Inhale 2 puffs into the lungs every 6 (six) hours as needed for wheezing or shortness of breath. (Patient not taking: Reported on 0/16/0109) 18 g 12  . folic acid (FOLVITE) 1 MG tablet Take 1 tablet (1 mg total) by mouth daily.    Marland Kitchen FREESTYLE LITE test strip CHECK BLOOD SUGAR TWICE DAILY. DX: E11.9**VERIFY PATIENTS METER** 100 strip 0  . glimepiride (AMARYL) 1 MG tablet TAKE 1 TABLET BY MOUTH EVERY DAY WITH BREAKFAST NEEDS APPT 90 tablet 0  . JANUVIA 100 MG tablet TAKE 1 TABLET BY MOUTH EVERY DAY 90 tablet 1  . Lancets (FREESTYLE) lancets Check blood sugar twice daily.  DX E11.9 100 each 6  . lisinopril-hydrochlorothiazide (ZESTORETIC) 20-12.5 MG tablet TAKE 1 TABLET BY MOUTH  TWICE A DAY 180 tablet 0  . meloxicam (MOBIC) 15 MG tablet TAKE 1 TABLET BY MOUTH EVERY DAY AS NEEDED FOR PAIN 90 tablet 1  . Menthol-Methyl Salicylate (MUSCLE RUB) 10-15 % CREA Apply 1 application topically as needed for muscle pain.    . metFORMIN (GLUCOPHAGE) 1000 MG tablet  TAKE 1 TABLET BY MOUTH 2 TIMES DAILY WITH A MEAL. 180 tablet 0  . metoprolol (TOPROL-XL) 200 MG 24 hr tablet TAKE 1 TABLET BY MOUTH EVERY DAY 90 tablet 1  . Multiple Vitamins-Minerals (PA MENS 50 PLUS VITAPAK PO) Take 1 capsule by mouth daily.    Marland Kitchen NEEDLE, DISP, 22 G 22G X 1-1/2" MISC Inject 1 application into the muscle every 14 (fourteen) days. 100 each 1  . simvastatin (ZOCOR) 10 MG tablet TAKE 1 TABLET BY MOUTH EVERY DAY AT 6PM 90 tablet 1  . testosterone cypionate (DEPOTESTOSTERONE CYPIONATE) 200 MG/ML injection Inject 0.5MLS into the muscle once for 1 dose 1 mL 0  . thiamine 100 MG tablet Take 1 tablet (100 mg total) by mouth daily.    Marland Kitchen triamcinolone cream (KENALOG) 0.1 % APPLY TO AFFECTED AREA TWICE A DAY 80 g 0  . umeclidinium-vilanterol (ANORO ELLIPTA) 62.5-25 MCG/INH AEPB Inhale 1 puff into the lungs daily. (Patient not taking: Reported on 03/29/2019) 60 each 12  . vitamin B-12 (CYANOCOBALAMIN) 100 MCG tablet Take 100 mcg by mouth daily.     No facility-administered medications prior to visit.    No Known Allergies  Review of Systems  Constitutional: Negative for fever and malaise/fatigue.  HENT: Negative for congestion.   Eyes: Negative for blurred vision.  Respiratory: Negative for shortness of breath.   Cardiovascular: Negative for chest pain, palpitations and leg swelling.  Gastrointestinal: Negative for abdominal pain, blood in stool and nausea.  Genitourinary: Negative for dysuria and frequency.  Musculoskeletal: Positive for joint pain. Negative for falls.  Skin: Negative for rash.  Neurological: Negative for dizziness, loss of consciousness and headaches.  Endo/Heme/Allergies: Negative for  environmental allergies.  Psychiatric/Behavioral: Negative for depression. The patient is not nervous/anxious.        Objective:    Physical Exam unable to obtain via phone visit.   BP 137/79   Pulse 98   Wt 232 lb (105.2 kg)   SpO2 96%   BMI 34.26 kg/m  Wt Readings from Last 3 Encounters:  06/15/20 232 lb (105.2 kg)  03/29/19 226 lb 9.6 oz (102.8 kg)  03/24/19 228 lb (103.4 kg)    Diabetic Foot Exam - Simple   No data filed     Lab Results  Component Value Date   WBC 5.2 04/07/2019   HGB 13.5 04/07/2019   HCT 38.8 (L) 04/07/2019   PLT 324.0 04/07/2019   GLUCOSE 106 (H) 04/07/2019   CHOL 114 04/07/2019   TRIG 106.0 04/07/2019   HDL 42.70 04/07/2019   LDLDIRECT 101.3 01/28/2008   LDLCALC 50 04/07/2019   ALT 34 04/07/2019   AST 19 04/07/2019   NA 128 (L) 04/07/2019   K 5.0 04/07/2019   CL 91 (L) 04/07/2019   CREATININE 0.73 04/07/2019   BUN 8 04/07/2019   CO2 28 04/07/2019   TSH 1.14 04/07/2019   PSA 0.25 08/06/2018   HGBA1C 5.4 04/07/2019   MICROALBUR 2.4 (H) 08/06/2018    Lab Results  Component Value Date   TSH 1.14 04/07/2019   Lab Results  Component Value Date   WBC 5.2 04/07/2019   HGB 13.5 04/07/2019   HCT 38.8 (L) 04/07/2019   MCV 96.9 04/07/2019   PLT 324.0 04/07/2019   Lab Results  Component Value Date   NA 128 (L) 04/07/2019   K 5.0 04/07/2019   CO2 28 04/07/2019   GLUCOSE 106 (H) 04/07/2019   BUN 8 04/07/2019   CREATININE 0.73 04/07/2019  BILITOT 0.5 04/07/2019   ALKPHOS 71 04/07/2019   AST 19 04/07/2019   ALT 34 04/07/2019   PROT 7.3 04/07/2019   ALBUMIN 4.8 04/07/2019   CALCIUM 10.0 04/07/2019   ANIONGAP 6 02/10/2018   GFR 108.93 04/07/2019   Lab Results  Component Value Date   CHOL 114 04/07/2019   Lab Results  Component Value Date   HDL 42.70 04/07/2019   Lab Results  Component Value Date   LDLCALC 50 04/07/2019   Lab Results  Component Value Date   TRIG 106.0 04/07/2019   Lab Results  Component Value Date    CHOLHDL 3 04/07/2019   Lab Results  Component Value Date   HGBA1C 5.4 04/07/2019       Assessment & Plan:   Problem List Items Addressed This Visit    MIXED HYPERLIPIDEMIA - Primary    Tolerating statin, encouraged heart healthy diet, avoid trans fats, minimize simple carbs and saturated fats. Increase exercise as tolerated      Relevant Orders   Lipid panel   Diabetes mellitus type 2 in obese (HCC)    hgba1c acceptable, minimize simple carbs. Increase exercise as tolerated. Continue current meds      Relevant Orders   Hemoglobin A1c   HTN (hypertension)    Well controlled, no changes to meds. Encouraged heart healthy diet such as the DASH diet and exercise as tolerated.       Relevant Orders   CBC   TSH   ED (erectile dysfunction)   Relevant Orders   PSA   Low testosterone in male    He is ready to use the topical treatments as opposed testosterone injections.       Relevant Orders   Testosterone   PSA   Hyponatremia    Asymptomatic, continue to monitor      Relevant Orders   Comprehensive metabolic panel      I am having Tregan A. Clay maintain his Multiple Vitamins-Minerals (PA MENS 50 PLUS VITAPAK PO), vitamin B-12, umeclidinium-vilanterol, acetaminophen, Muscle Rub, folic acid, thiamine, freestyle, NEEDLE (DISP) 22 G, albuterol, testosterone cypionate, meloxicam, metoprolol, triamcinolone cream, metFORMIN, FREESTYLE LITE, lisinopril-hydrochlorothiazide, Januvia, simvastatin, and glimepiride.  No orders of the defined types were placed in this encounter.    I discussed the assessment and treatment plan with the patient. The patient was provided an opportunity to ask questions and all were answered. The patient agreed with the plan and demonstrated an understanding of the instructions.   The patient was advised to call back or seek an in-person evaluation if the symptoms worsen or if the condition fails to improve as anticipated.  I provided 20 minutes of  non-face-to-face time during this encounter.   Penni Homans, MD

## 2020-06-17 NOTE — Assessment & Plan Note (Signed)
Asymptomatic, continue to monitor.

## 2020-06-17 NOTE — Assessment & Plan Note (Signed)
hgba1c acceptable, minimize simple carbs. Increase exercise as tolerated. Continue current meds 

## 2020-06-17 NOTE — Assessment & Plan Note (Signed)
Well controlled, no changes to meds. Encouraged heart healthy diet such as the DASH diet and exercise as tolerated.  °

## 2020-06-17 NOTE — Assessment & Plan Note (Signed)
He is ready to use the topical treatments as opposed testosterone injections.

## 2020-06-17 NOTE — Assessment & Plan Note (Signed)
Tolerating statin, encouraged heart healthy diet, avoid trans fats, minimize simple carbs and saturated fats. Increase exercise as tolerated 

## 2020-06-25 ENCOUNTER — Other Ambulatory Visit (INDEPENDENT_AMBULATORY_CARE_PROVIDER_SITE_OTHER): Payer: BC Managed Care – PPO

## 2020-06-25 DIAGNOSIS — N529 Male erectile dysfunction, unspecified: Secondary | ICD-10-CM

## 2020-06-25 DIAGNOSIS — E669 Obesity, unspecified: Secondary | ICD-10-CM | POA: Diagnosis not present

## 2020-06-25 DIAGNOSIS — E782 Mixed hyperlipidemia: Secondary | ICD-10-CM

## 2020-06-25 DIAGNOSIS — E871 Hypo-osmolality and hyponatremia: Secondary | ICD-10-CM | POA: Diagnosis not present

## 2020-06-25 DIAGNOSIS — I10 Essential (primary) hypertension: Secondary | ICD-10-CM | POA: Diagnosis not present

## 2020-06-25 DIAGNOSIS — E1169 Type 2 diabetes mellitus with other specified complication: Secondary | ICD-10-CM | POA: Diagnosis not present

## 2020-06-25 DIAGNOSIS — R7989 Other specified abnormal findings of blood chemistry: Secondary | ICD-10-CM | POA: Diagnosis not present

## 2020-06-25 LAB — COMPREHENSIVE METABOLIC PANEL
ALT: 51 U/L (ref 0–53)
AST: 24 U/L (ref 0–37)
Albumin: 4.7 g/dL (ref 3.5–5.2)
Alkaline Phosphatase: 70 U/L (ref 39–117)
BUN: 12 mg/dL (ref 6–23)
CO2: 27 mEq/L (ref 19–32)
Calcium: 9.7 mg/dL (ref 8.4–10.5)
Chloride: 89 mEq/L — ABNORMAL LOW (ref 96–112)
Creatinine, Ser: 0.81 mg/dL (ref 0.40–1.50)
GFR: 94.13 mL/min (ref >60.00)
Glucose, Bld: 127 mg/dL — ABNORMAL HIGH (ref 70–99)
Potassium: 4.2 mEq/L (ref 3.5–5.1)
Sodium: 127 mEq/L — ABNORMAL LOW (ref 135–145)
Total Bilirubin: 0.4 mg/dL (ref 0.2–1.2)
Total Protein: 7.1 g/dL (ref 6.0–8.3)

## 2020-06-25 LAB — LIPID PANEL
Cholesterol: 115 mg/dL (ref 0–200)
HDL: 49.8 mg/dL (ref >39.00)
LDL Cholesterol: 47 mg/dL (ref 0–99)
NonHDL: 65.33
Total CHOL/HDL Ratio: 2
Triglycerides: 92 mg/dL (ref 0.0–149.0)
VLDL: 18.4 mg/dL (ref 0.0–40.0)

## 2020-06-25 LAB — CBC
HCT: 38.2 % — ABNORMAL LOW (ref 39.0–52.0)
Hemoglobin: 13.2 g/dL (ref 13.0–17.0)
MCHC: 34.6 g/dL (ref 30.0–36.0)
MCV: 94.7 fl (ref 78.0–100.0)
Platelets: 342 10*3/uL (ref 150.0–400.0)
RBC: 4.04 Mil/uL — ABNORMAL LOW (ref 4.22–5.81)
RDW: 13.1 % (ref 11.5–15.5)
WBC: 6.5 10*3/uL (ref 4.0–10.5)

## 2020-06-25 LAB — HEMOGLOBIN A1C: Hgb A1c MFr Bld: 6 % (ref 4.6–6.5)

## 2020-06-25 LAB — TSH: TSH: 0.89 u[IU]/mL (ref 0.35–4.50)

## 2020-06-25 LAB — TESTOSTERONE: Testosterone: 150.32 ng/dL — ABNORMAL LOW (ref 300.00–890.00)

## 2020-06-25 LAB — PSA: PSA: 0.25 ng/mL (ref 0.10–4.00)

## 2020-06-26 ENCOUNTER — Telehealth: Payer: Self-pay

## 2020-06-26 NOTE — Telephone Encounter (Signed)
Notify pt and inform sodium is still low. Decrease water intake by one beverage a day and increase sodium in the diet, repeat cmp in 1 month. Testosterone has gone down. Can switch to androgel topically if he would like to try it. Please reply to office to let us know so we can send medication to pharmacy

## 2020-06-26 NOTE — Telephone Encounter (Signed)
-----   Message from Mosie Lukes, MD sent at 06/25/2020  8:14 PM EST ----- Notify sodium is still low. Decrease water intake by one beverage a day and increase sodium in the diet, repeat cmp in 1 month. Testosterone has gone down. Can switch to androgel topically if he would like to try it.  Will send it in if he agrees.

## 2020-07-05 ENCOUNTER — Other Ambulatory Visit: Payer: Self-pay | Admitting: Family Medicine

## 2020-08-03 ENCOUNTER — Encounter: Payer: Self-pay | Admitting: Family Medicine

## 2020-08-03 MED ORDER — SIMVASTATIN 10 MG PO TABS: 10.0000 mg | ORAL_TABLET | Freq: Every day | ORAL | 1 refills | Status: DC

## 2020-08-03 MED ORDER — LISINOPRIL-HYDROCHLOROTHIAZIDE 20-12.5 MG PO TABS: 1.0000 | ORAL_TABLET | Freq: Two times a day (BID) | ORAL | 1 refills | Status: DC

## 2020-08-03 MED ORDER — GLIMEPIRIDE 1 MG PO TABS
1.0000 mg | ORAL_TABLET | Freq: Every day | ORAL | 1 refills | Status: DC
Start: 2020-08-03 — End: 2021-02-04

## 2020-08-03 MED ORDER — METOPROLOL SUCCINATE ER 200 MG PO TB24: 200.0000 mg | ORAL_TABLET | Freq: Every day | ORAL | 1 refills | Status: DC

## 2020-12-26 ENCOUNTER — Other Ambulatory Visit: Payer: Self-pay | Admitting: Family Medicine

## 2021-01-30 ENCOUNTER — Other Ambulatory Visit: Payer: Self-pay | Admitting: Family Medicine

## 2021-02-04 ENCOUNTER — Other Ambulatory Visit: Payer: Self-pay | Admitting: Family Medicine

## 2021-03-02 ENCOUNTER — Other Ambulatory Visit: Payer: Self-pay | Admitting: Family Medicine

## 2021-03-12 ENCOUNTER — Other Ambulatory Visit: Payer: Self-pay | Admitting: Family Medicine

## 2021-05-17 LAB — HM DIABETES EYE EXAM

## 2021-05-21 ENCOUNTER — Other Ambulatory Visit: Payer: Self-pay

## 2021-05-21 ENCOUNTER — Ambulatory Visit (INDEPENDENT_AMBULATORY_CARE_PROVIDER_SITE_OTHER): Payer: BC Managed Care – PPO | Admitting: Family Medicine

## 2021-05-21 ENCOUNTER — Encounter: Payer: Self-pay | Admitting: Family Medicine

## 2021-05-21 VITALS — BP 134/78 | HR 104 | Temp 99.1°F | Resp 16 | Ht 69.0 in | Wt 246.8 lb

## 2021-05-21 DIAGNOSIS — E782 Mixed hyperlipidemia: Secondary | ICD-10-CM | POA: Diagnosis not present

## 2021-05-21 DIAGNOSIS — I10 Essential (primary) hypertension: Secondary | ICD-10-CM | POA: Diagnosis not present

## 2021-05-21 DIAGNOSIS — R7989 Other specified abnormal findings of blood chemistry: Secondary | ICD-10-CM

## 2021-05-21 DIAGNOSIS — R0683 Snoring: Secondary | ICD-10-CM

## 2021-05-21 DIAGNOSIS — Z0000 Encounter for general adult medical examination without abnormal findings: Secondary | ICD-10-CM | POA: Diagnosis not present

## 2021-05-21 DIAGNOSIS — N529 Male erectile dysfunction, unspecified: Secondary | ICD-10-CM

## 2021-05-21 DIAGNOSIS — E669 Obesity, unspecified: Secondary | ICD-10-CM

## 2021-05-21 DIAGNOSIS — E6609 Other obesity due to excess calories: Secondary | ICD-10-CM | POA: Diagnosis not present

## 2021-05-21 DIAGNOSIS — Z23 Encounter for immunization: Secondary | ICD-10-CM

## 2021-05-21 DIAGNOSIS — E1169 Type 2 diabetes mellitus with other specified complication: Secondary | ICD-10-CM | POA: Diagnosis not present

## 2021-05-21 DIAGNOSIS — E871 Hypo-osmolality and hyponatremia: Secondary | ICD-10-CM

## 2021-05-21 DIAGNOSIS — F1010 Alcohol abuse, uncomplicated: Secondary | ICD-10-CM

## 2021-05-21 NOTE — Patient Instructions (Addendum)
-Covid bivalent booster available for free at [facility] from 9 am to 3 pm Mon-Fri or any pharmacy.  -Shingrix is the new shingles shot, 2 shots over 2-6 months, confirm coverage with insurance and document, then can return here for shots with nurse appt or at pharmacy   Preventive Care 62-64 Years Old, Male Preventive care refers to lifestyle choices and visits with your health care provider that can promote health and wellness. This includes: A yearly physical exam. This is also called an annual wellness visit. Regular dental and eye exams. Immunizations. Screening for certain conditions. Healthy lifestyle choices, such as: Eating a healthy diet. Getting regular exercise. Not using drugs or products that contain nicotine and tobacco. Limiting alcohol use. What can I expect for my preventive care visit? Physical exam Your health care provider will check your: Height and weight. These may be used to calculate your BMI (body mass index). BMI is a measurement that tells if you are at a healthy weight. Heart rate and blood pressure. Body temperature. Skin for abnormal spots. Counseling Your health care provider may ask you questions about your: Past medical problems. Family's medical history. Alcohol, tobacco, and drug use. Emotional well-being. Home life and relationship well-being. Sexual activity. Diet, exercise, and sleep habits. Work and work Statistician. Access to firearms. What immunizations do I need? Vaccines are usually given at various ages, according to a schedule. Your health care provider will recommend vaccines for you based on your age, medical history, and lifestyle or other factors, such as travel or where you work. What tests do I need? Blood tests Lipid and cholesterol levels. These may be checked every 5 years, or more often if you are over 57 years old. Hepatitis C test. Hepatitis B test. Screening Lung cancer screening. You may have  this screening every year starting at age 23 if you have a 30-pack-year history of smoking and currently smoke or have quit within the past 15 years. Prostate cancer screening. Recommendations will vary depending on your family history and other risks. Genital exam to check for testicular cancer or hernias. Colorectal cancer screening. All adults should have this screening starting at age 70 and continuing until age 42. Your health care provider may recommend screening at age 58 if you are at increased risk. You will have tests every 1-10 years, depending on your results and the type of screening test. Diabetes screening. This is done by checking your blood sugar (glucose) after you have not eaten for a while (fasting). You may have this done every 1-3 years. STD (sexually transmitted disease) testing, if you are at risk. Follow these instructions at home: Eating and drinking  Eat a diet that includes fresh fruits and vegetables, whole grains, lean protein, and low-fat dairy products. Take vitamin and mineral supplements as recommended by your health care provider. Do not drink alcohol if your health care provider tells you not to drink. If you drink alcohol: Limit how much you have to 0-2 drinks a day. Be aware of how much alcohol is in your drink. In the [place] one drink equals one 12 oz bottle of beer (355 mL), one 5 oz glass of wine (148 mL), or one 1 oz glass of hard liquor (44 mL). Lifestyle Take daily care of your teeth and gums. Brush your teeth every morning and night with fluoride toothpaste. Floss one time each day. Stay active. Exercise for at least 30 minutes 5 or more days each week. Do not use any products  that contain nicotine or tobacco, such as cigarettes, e-cigarettes, and chewing tobacco. If you need help quitting, ask your health care provider. Do not use drugs. If you are sexually active, practice safe sex. Use a condom or other form of protection to prevent STIs  (sexually transmitted infections). If told by your health care provider, take low-dose aspirin daily starting at age 14. Find healthy ways to cope with stress, such as: Meditation, yoga, or listening to music. Journaling. Talking to a trusted person. Spending time with friends and family. Safety Always wear your seat belt while driving or riding in a vehicle. Do not drive: If you have been drinking alcohol. Do not ride with someone who has been drinking. When you are tired or distracted. While texting. Wear a helmet and other protective equipment during sports activities. If you have firearms in your house, make sure you follow all gun safety procedures. What's next? Go to your health care provider once a year for an annual wellness visit. Ask your health care provider how often you should have your eyes and teeth checked. Stay up to date on all vaccines. This information is not intended to replace advice given to you by your health care provider. Make sure you discuss any questions you have with your health care provider. Document Revised: 10/12/2020 Document Reviewed: 07/29/2018 Elsevier Patient Education  2022 [employer].

## 2021-05-21 NOTE — Assessment & Plan Note (Signed)
He will ask his family if he is snoring more

## 2021-05-21 NOTE — Assessment & Plan Note (Signed)
Declines to check due to insurance not paying

## 2021-05-21 NOTE — Assessment & Plan Note (Addendum)
Patient encouraged to maintain heart healthy diet, regular exercise, adequate sleep. Consider daily probiotics. Take medications as prescribed. Labs ordered and reviewed. Given Tdap today 

## 2021-05-21 NOTE — Progress Notes (Signed)
Patient ID: Phillip Doyle, male    DOB: 17-Feb-1956  Age: 65 y.o. MRN: [id number]    Subjective:   No chief complaint on file.  Subjective  HPI Phillip Doyle for office visit today for comprehensive physical exam today and follow up on management of chronic concerns. His sugar this morning was 120 and last night his non-fasting was 168. Phillip Doyle is still dealing with low energy. Insurance is not covering his Januvia unless Phillip Doyle gets a physical. Denies CP/palp/SOB/HA/congestion/fevers/GI or GU c/o. Taking meds as prescribed.Has about 1-2 beers at night.   Review of Systems  Constitutional:  Negative for chills, fatigue and fever.  HENT:  Negative for congestion, rhinorrhea, sinus pressure, sinus pain, sore throat and trouble swallowing.   Eyes:  Negative for pain.  Respiratory:  Negative for cough and shortness of breath.   Cardiovascular:  Negative for chest pain, palpitations and leg swelling.  Gastrointestinal:  Negative for abdominal pain, blood in stool, diarrhea, nausea and vomiting.  Genitourinary:  Negative for flank pain, frequency and penile pain.  Musculoskeletal:  Negative for back pain.  Neurological:  Negative for headaches.  Psychiatric/Behavioral:  Negative for sleep disturbance.    History 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/01/2016   Low testosterone level in male 08/01/2016   LUMBAR STRAIN 10/17/2010   Mixed hyperlipidemia 09/32/6712   NISSEN FUNDOPLICATION, HX OF 4/58/0998   OA  (osteoarthritis) of knee 02/17/2012   Obesity    PEPTIC ULCER DISEASE 02/15/2007   Personal history of colonic polyps 01/17/2013   colonoscoppy 2013, benign polyp advised repeat in 10 years   Preventative health care 07/19/2012   Restless sleeper 10/30/2016   TOBACCO ABUSE 07/04/2010   Ulcer 1982   peptic ulcer disease    Phillip Doyle has a past surgical history that includes Rotator cuff repair (2000); Vasectomy (3382); fundiplication for [facility] and reflux (1987); negative stress test (2008); and Colonoscopy (03/2012).   His family history includes Alcohol abuse in his father; Asthma in his mother; Cancer in his brother, mother, and sister; Diabetes in his brother; Heart disease in his brother; Hypertension in his brother and maternal grandfather; Stroke in his maternal grandmother.Phillip Doyle reports that Phillip Doyle quit smoking about 6 years ago. His smoking use included cigarettes. Phillip Doyle has a 80.00 pack-year smoking history. Phillip Doyle quit smokeless tobacco use about 9 years ago. Phillip Doyle reports current alcohol use of about 3.0 - 4.0 standard drinks per week. Phillip Doyle reports that Phillip Doyle does not use drugs.  Current Outpatient Medications on File Prior to Visit  Medication Sig Dispense Refill   acetaminophen (TYLENOL) 650 MG CR tablet Take 1,300 mg by mouth 2 (two) times daily as needed for pain.     albuterol (VENTOLIN HFA) 108 (90 Base) MCG/ACT inhaler TAKE 2 PUFFS INTO LUNGS EVERY 6 HOURS AS NEEDED FOR WHEEZE OR SHORTNESS OF BREATH 18 each 12   FREESTYLE LITE test strip CHECK BLOOD SUGAR TWICE DAILY. DX: E11.9**VERIFY  PATIENTS METER** 100 strip 0   glimepiride (AMARYL) 1 MG tablet TAKE 1 TABLET BY MOUTH DAILY WITH BREAKFAST. 90 tablet 1   JANUVIA 100 MG tablet TAKE 1 TABLET BY MOUTH EVERY DAY 90 tablet 1   Lancets (FREESTYLE) lancets Check blood sugar twice daily.  DX E11.9 100 each 6   lisinopril-hydrochlorothiazide (ZESTORETIC) 20-12.5 MG tablet TAKE 1 TABLET BY MOUTH TWICE A DAY 180 tablet 1   metFORMIN (GLUCOPHAGE) 1000 MG tablet TAKE 1  TABLET BY MOUTH TWICE A DAY WITH MEALS 180 tablet 1   metoprolol (TOPROL-XL) 200 MG 24 hr tablet TAKE 1 TABLET BY MOUTH EVERY DAY 90 tablet 1   Multiple Vitamins-Minerals (PA MENS 50 PLUS VITAPAK PO) Take 1 capsule by mouth daily.     Oral Electrolytes (H-E-B ORAL ELECTROLYTE PO) Take by mouth.     simvastatin (ZOCOR) 10 MG tablet TAKE 1 TABLET BY MOUTH EVERY DAY AT 6PM 90 tablet 1   No current facility-administered medications on file prior to visit.     Objective:  Objective  Physical Exam Constitutional:      General: Phillip Doyle is not in acute distress.    Appearance: Normal appearance. Phillip Doyle is not ill-appearing or toxic-appearing.  HENT:     Head: Normocephalic and atraumatic.     Right Ear: Tympanic membrane, ear canal and external ear normal.     Left Ear: Tympanic membrane, ear canal and external ear normal.     Nose: No congestion or rhinorrhea.  Eyes:     Extraocular Movements: Extraocular movements intact.     Pupils: Pupils are equal, round, and reactive to light.  Cardiovascular:     Rate and Rhythm: Normal rate and regular rhythm.     Pulses: Normal pulses.     Heart sounds: Normal heart sounds. No murmur heard. Pulmonary:     Effort: Pulmonary effort is normal. No respiratory distress.     Breath sounds: Normal breath sounds. No wheezing, rhonchi or rales.  Abdominal:     General: Bowel sounds are normal.     Palpations: Abdomen is soft. There is no mass.     Tenderness: There is no abdominal tenderness. There is no guarding.     Hernia: No hernia is present.  Musculoskeletal:        General: Normal range of motion.     Cervical back: Normal range of motion and neck supple.  Skin:    General: Skin is warm and dry.  Neurological:     Mental Status: Phillip Doyle is alert and oriented to person, place, and time.  Psychiatric:        Behavior: Behavior normal.   BP 134/78   Pulse (!) 104   Temp 99.1 F (37.3 C)   Resp 16   Ht 5\' 9"  (1.753 m)   Wt 246 lb 12.8 oz (111.9 kg)    SpO2 95%   BMI 36.45 kg/m  Wt Readings from Last 3 Encounters:  05/21/21 246 lb 12.8 oz (111.9 kg)  06/15/20 232 lb (105.2 kg)  03/29/19 226 lb 9.6 oz (102.8 kg)     Lab Results  Component Value Date   WBC 8.6 05/21/2021   HGB 13.1 05/21/2021   HCT 38.4 (L) 05/21/2021   PLT 368.0 05/21/2021   GLUCOSE 121 (H) 05/21/2021   CHOL 134 05/21/2021   TRIG 200.0 (H) 05/21/2021   HDL 39.00 (L) 05/21/2021   LDLDIRECT 101.3 01/28/2008   LDLCALC 55 05/21/2021   ALT 33 05/21/2021  AST 17 05/21/2021   NA 131 (L) 05/21/2021   K 4.1 05/21/2021   CL 91 (L) 05/21/2021   CREATININE 0.83 05/21/2021   BUN 13 05/21/2021   CO2 29 05/21/2021   TSH 1.72 05/21/2021   PSA 0.25 06/25/2020   HGBA1C 8.2 (H) 05/21/2021   MICROALBUR 2.4 (H) 08/06/2018    No results found.   Assessment & Plan:  Plan    No orders of the defined types were placed in this encounter.   Problem List Items Addressed This Visit     Obesity (Chronic)    Maintain heart healthy diet such as DASH or MIND diet, decrease po intake and increase exercise as tolerated. Needs 7-8 hours of sleep nightly. Avoid trans fats, eat small, frequent meals every 4-5 hours with lean proteins, complex carbs and healthy fats. Minimize simple carbs, high fat foods and processed foods      MIXED HYPERLIPIDEMIA    Tolerating statin, encouraged heart healthy diet, avoid trans fats, minimize simple carbs and saturated fats. Increase exercise as tolerated      Relevant Orders   CBC with Differential/Platelet (Completed)   Comprehensive metabolic panel (Completed)   Lipid panel (Completed)   TSH (Completed)   Diabetes mellitus type 2 in obese ([place])    hgba1c is up. Phillip Doyle needs to restart Januvia or switch to Ozempic. Continue metformin and minimize carbohydrates in diet      Relevant Orders   Hemoglobin A1c (Completed)   CBC with Differential/Platelet (Completed)   Comprehensive metabolic panel (Completed)   Lipid panel (Completed)    TSH (Completed)   HTN (hypertension)    Well controlled, no changes to meds. Encouraged heart healthy diet such as the DASH diet and exercise as tolerated.       Relevant Orders   CBC with Differential/Platelet (Completed)   Comprehensive metabolic panel (Completed)   Lipid panel (Completed)   TSH (Completed)   ED (erectile dysfunction)   Preventative health care    Patient encouraged to maintain heart healthy diet, regular exercise, adequate sleep. Consider daily probiotics. Take medications as prescribed. Labs ordered and reviewed. Given Tdap today      Low testosterone in male    Declines to check due to insurance not paying      Hyponatremia    Asymptomatic and mild. Improved some. Will continue to monitor and Phillip Doyle should minimize his alcohol intake      Primary snoring    Phillip Doyle will ask his family if Phillip Doyle is snoring more      Alcohol abuse    Phillip Doyle reports Phillip Doyle has cut down on his intake but Phillip Doyle has not stopped.       Other Visit Diagnoses     Need for influenza vaccination    -  Primary   Relevant Orders   Flu Vaccine QUAD 36+ mos IM (Fluarix, Fluzone & Afluria Quad PF (Completed)   Need for Tdap vaccination       Relevant Orders   Tdap vaccine greater than or equal to 7yo IM (Completed)       Follow-up: Return in about 6 months (around 11/19/2021) for 6 month f/u visit.  I, Suezanne Jacquet, acting as a scribe for Penni Homans, MD, have documented all relevent documentation on behalf of Penni Homans, MD, as directed by Penni Homans, MD while in the presence of Penni Homans, MD. DO:05/22/21.  I, Mosie Lukes, MD personally performed the services described in this documentation. All medical record entries made  by the scribe were at my direction and in my presence. I have reviewed the chart and agree that the record reflects my personal performance and is accurate and complete

## 2021-05-22 LAB — COMPREHENSIVE METABOLIC PANEL
ALT: 33 U/L (ref 0–53)
AST: 17 U/L (ref 0–37)
Albumin: 4.5 g/dL (ref 3.5–5.2)
Alkaline Phosphatase: 66 U/L (ref 39–117)
BUN: 13 mg/dL (ref 6–23)
CO2: 29 mEq/L (ref 19–32)
Calcium: 9.7 mg/dL (ref 8.4–10.5)
Chloride: 91 mEq/L — ABNORMAL LOW (ref 96–112)
Creatinine, Ser: 0.83 mg/dL (ref 0.40–1.50)
GFR: 92.85 mL/min (ref >60.00)
Glucose, Bld: 121 mg/dL — ABNORMAL HIGH (ref 70–99)
Potassium: 4.1 mEq/L (ref 3.5–5.1)
Sodium: 131 mEq/L — ABNORMAL LOW (ref 135–145)
Total Bilirubin: 0.4 mg/dL (ref 0.2–1.2)
Total Protein: 7.1 g/dL (ref 6.0–8.3)

## 2021-05-22 LAB — HEMOGLOBIN A1C: Hgb A1c MFr Bld: 8.2 % — ABNORMAL HIGH (ref 4.6–6.5)

## 2021-05-22 LAB — CBC WITH DIFFERENTIAL/PLATELET
Basophils Absolute: 0.1 10*3/uL (ref 0.0–0.1)
Basophils Relative: 0.9 % (ref 0.0–3.0)
Eosinophils Absolute: 0.4 10*3/uL (ref 0.0–0.7)
Eosinophils Relative: 4.7 % (ref 0.0–5.0)
HCT: 38.4 % — ABNORMAL LOW (ref 39.0–52.0)
Hemoglobin: 13.1 g/dL (ref 13.0–17.0)
Lymphocytes Relative: 25.7 % (ref 12.0–46.0)
Lymphs Abs: 2.2 10*3/uL (ref 0.7–4.0)
MCHC: 34.2 g/dL (ref 30.0–36.0)
MCV: 94.2 fl (ref 78.0–100.0)
Monocytes Absolute: 0.8 10*3/uL (ref 0.1–1.0)
Monocytes Relative: 9.5 % (ref 3.0–12.0)
Neutro Abs: 5.1 10*3/uL (ref 1.4–7.7)
Neutrophils Relative %: 59.2 % (ref 43.0–77.0)
Platelets: 368 10*3/uL (ref 150.0–400.0)
RBC: 4.08 Mil/uL — ABNORMAL LOW (ref 4.22–5.81)
RDW: 13 % (ref 11.5–15.5)
WBC: 8.6 10*3/uL (ref 4.0–10.5)

## 2021-05-22 LAB — LIPID PANEL
Cholesterol: 134 mg/dL (ref 0–200)
HDL: 39 mg/dL — ABNORMAL LOW (ref >39.00)
LDL Cholesterol: 55 mg/dL (ref 0–99)
NonHDL: 94.54
Total CHOL/HDL Ratio: 3
Triglycerides: 200 mg/dL — ABNORMAL HIGH (ref 0.0–149.0)
VLDL: 40 mg/dL (ref 0.0–40.0)

## 2021-05-22 LAB — TSH: TSH: 1.72 u[IU]/mL (ref 0.35–5.50)

## 2021-05-22 NOTE — Assessment & Plan Note (Signed)
Well controlled, no changes to meds. Encouraged heart healthy diet such as the DASH diet and exercise as tolerated.  °

## 2021-05-22 NOTE — Assessment & Plan Note (Signed)
Maintain heart healthy diet such as DASH or MIND diet, decrease po intake and increase exercise as tolerated. Needs 7-8 hours of sleep nightly. Avoid trans fats, eat small, frequent meals every 4-5 hours with lean proteins, complex carbs and healthy fats. Minimize simple carbs, high fat foods and processed foods  

## 2021-05-22 NOTE — Assessment & Plan Note (Signed)
Tolerating statin, encouraged heart healthy diet, avoid trans fats, minimize simple carbs and saturated fats. Increase exercise as tolerated 

## 2021-05-22 NOTE — Assessment & Plan Note (Signed)
hgba1c is up. He needs to restart Januvia or switch to Ozempic. Continue metformin and minimize carbohydrates in diet

## 2021-05-22 NOTE — Assessment & Plan Note (Signed)
He reports he has cut down on his intake but he has not stopped.

## 2021-05-22 NOTE — Assessment & Plan Note (Signed)
Asymptomatic and mild. Improved some. Will continue to monitor and he should minimize his alcohol intake

## 2021-05-23 ENCOUNTER — Encounter: Payer: Self-pay | Admitting: Family Medicine

## 2021-05-23 ENCOUNTER — Other Ambulatory Visit: Payer: Self-pay | Admitting: Family Medicine

## 2021-05-23 MED ORDER — OZEMPIC (0.25 OR 0.5 MG/DOSE) 2 MG/1.5ML ~~LOC~~ SOPN: 0.2500 mg | PEN_INJECTOR | SUBCUTANEOUS | 0 refills | Status: DC

## 2021-06-11 ENCOUNTER — Encounter: Payer: Self-pay | Admitting: Family Medicine

## 2021-06-27 ENCOUNTER — Other Ambulatory Visit: Payer: Self-pay | Admitting: Family Medicine

## 2021-07-02 ENCOUNTER — Encounter: Payer: Self-pay | Admitting: Family Medicine

## 2021-07-02 MED ORDER — OZEMPIC (0.25 OR 0.5 MG/DOSE) 2 MG/1.5ML ~~LOC~~ SOPN
0.2500 mg | PEN_INJECTOR | SUBCUTANEOUS | 3 refills | Status: DC
Start: 2021-07-02 — End: 2021-07-04

## 2021-07-04 ENCOUNTER — Encounter: Payer: Self-pay | Admitting: Family Medicine

## 2021-07-04 MED ORDER — OZEMPIC (0.25 OR 0.5 MG/DOSE) 2 MG/1.5ML ~~LOC~~ SOPN
0.2500 mg | PEN_INJECTOR | SUBCUTANEOUS | 0 refills | Status: DC
Start: 2021-07-04 — End: 2021-11-19

## 2021-08-01 ENCOUNTER — Other Ambulatory Visit: Payer: Self-pay | Admitting: Family Medicine

## 2021-11-18 NOTE — Progress Notes (Deleted)
? ?Subjective:  ? ? Patient ID: Phillip Doyle, male    DOB: 1956-08-14, 66 y.o.   MRN: [id number] ? ?No chief complaint on file. ? ? ?HPI ?Patient is in today for a follow up visit. ? ?Past Medical History:  ?Diagnosis Date  ? Alcohol abuse 11/28/2013  ? ALLERGIC RHINITIS 02/15/2007  ? Allergy   ? rhinitis  ? Bronchitis 07/14/2012  ? Diabetes mellitus   ? DM 10/24/2010  ? ED (erectile dysfunction) 02/17/2012  ? EXOGENOUS OBESITY 02/15/2007  ? H/O alcohol abuse 11/28/2013  ? H/O tobacco use, presenting hazards to health 07/04/2010  ? Qualifier: Diagnosis of  By: [name] MD, Erline Levine  1/2 ppd   ? HTN (hypertension) 08/20/2011  ? Hypertension   ? HYPERTENSION 02/15/2007  ? Impaired glucose tolerance   ? Low testosterone in male 08/01/2016  ? Low testosterone level in male 08/01/2016  ? LUMBAR STRAIN 10/17/2010  ? Mixed hyperlipidemia 07/04/2010  ? NISSEN FUNDOPLICATION, HX OF 9/89/2119  ? OA (osteoarthritis) of knee 02/17/2012  ? Obesity   ? PEPTIC ULCER DISEASE 02/15/2007  ? Personal history of colonic polyps 01/17/2013  ? colonoscoppy 2013, benign polyp advised repeat in 10 years  ? Preventative health care 07/19/2012  ? Restless sleeper 10/30/2016  ? TOBACCO ABUSE 07/04/2010  ? Ulcer 1982  ? peptic ulcer disease  ? ? ?Past Surgical History:  ?Procedure Laterality Date  ? COLONOSCOPY  03/2012  ? Several hyperplastic polyps  ? fundiplication for [facility] and reflux  1987  ? negative stress test  2008  ? ROTATOR CUFF REPAIR  2000  ? VASECTOMY  1982  ? ? ?Family History  ?Problem Relation Age of Onset  ? Cancer Mother   ?     uterine   ? Asthma Mother   ? Alcohol abuse Father   ? Cancer Sister   ?     Brain tumor  ? Hypertension Brother   ? Diabetes Brother   ? Heart disease Brother   ?     aortic valve replaced, CAD  ? Cancer Brother   ?     prostate  ? Stroke Maternal Grandmother   ?     possibly  ? Hypertension Maternal Grandfather   ? Colon cancer Neg Hx   ? ? ?Social History  ? ?Socioeconomic History  ? Marital status: Married  ?  Spouse name:  [name]  ? Number of children: Not on file  ? Years of education: Not on file  ? Highest education level: Not on file  ?Occupational History  ?  Employer: [employer]  ?Tobacco Use  ? Smoking status: Former  ?  Packs/day: 2.00  ?  Years: 40.00  ?  Pack years: 80.00  ?  Types: Cigarettes  ?  Quit date: 06/28/2014  ?  Years since quitting: 7.3  ? Smokeless tobacco: Former  ?  Quit date: 03/18/2012  ? Tobacco comments:  ?  quit 06/18/10- started back   ?Vaping Use  ? Vaping Use: Not on file  ?Substance and Sexual Activity  ? Alcohol use: Yes  ?  Alcohol/week: 3.0 - 4.0 standard drinks  ?  Types: 3 - 4 Cans of beer per week  ?  Comment: 3-4 every other day  ? Drug use: No  ? Sexual activity: Yes  ?  Partners: Female  ?Other Topics Concern  ? Not on file  ?Social History Narrative  ? Not on file  ? ?Social Determinants of Health  ? ?Financial  Resource Strain: Not on file  ?Food Insecurity: Not on file  ?Transportation Needs: Not on file  ?Physical Activity: Not on file  ?Stress: Not on file  ?Social Connections: Not on file  ?Intimate Partner Violence: Not on file  ? ? ?Outpatient Medications Prior to Visit  ?Medication Sig Dispense Refill  ? acetaminophen (TYLENOL) 650 MG CR tablet Take 1,300 mg by mouth 2 (two) times daily as needed for pain.    ? albuterol (VENTOLIN HFA) 108 (90 Base) MCG/ACT inhaler TAKE 2 PUFFS INTO LUNGS EVERY 6 HOURS AS NEEDED FOR WHEEZE OR SHORTNESS OF BREATH 18 each 12  ? FREESTYLE LITE test strip CHECK BLOOD SUGAR TWICE DAILY. DX: E11.9**VERIFY PATIENTS METER** 100 strip 0  ? glimepiride (AMARYL) 1 MG tablet TAKE 1 TABLET BY MOUTH EVERY DAY WITH BREAKFAST 90 tablet 1  ? Lancets (FREESTYLE) lancets Check blood sugar twice daily.  DX E11.9 100 each 6  ? lisinopril-hydrochlorothiazide (ZESTORETIC) 20-12.5 MG tablet TAKE 1 TABLET BY MOUTH TWICE A DAY 180 tablet 1  ? metFORMIN (GLUCOPHAGE) 1000 MG tablet TAKE 1 TABLET BY MOUTH TWICE A DAY WITH MEALS 180 tablet 1  ? metoprolol (TOPROL-XL) 200 MG 24 hr  tablet TAKE 1 TABLET BY MOUTH EVERY DAY 90 tablet 1  ? Multiple Vitamins-Minerals (PA MENS 50 PLUS VITAPAK PO) Take 1 capsule by mouth daily.    ? Oral Electrolytes (H-E-B ORAL ELECTROLYTE PO) Take by mouth.    ? Semaglutide,0.25 or 0.'5MG'$ /DOS, (OZEMPIC, 0.25 OR 0.5 MG/DOSE,) 2 MG/1.5ML SOPN Inject 0.25 mg into the skin once a week. 9 mL 0  ? simvastatin (ZOCOR) 10 MG tablet TAKE 1 TABLET BY MOUTH EVERY DAY AT 6PM 90 tablet 1  ? ?No facility-administered medications prior to visit.  ? ? ?No Known Allergies ? ?ROS ? ?   ?Objective:  ?  ?Physical Exam ? ?There were no vitals taken for this visit. ?Wt Readings from Last 3 Encounters:  ?05/21/21 246 lb 12.8 oz (111.9 kg)  ?06/15/20 232 lb (105.2 kg)  ?03/29/19 226 lb 9.6 oz (102.8 kg)  ? ? ?Diabetic Foot Exam - Simple   ?No data filed ?  ? ?Lab Results  ?Component Value Date  ? WBC 8.6 05/21/2021  ? HGB 13.1 05/21/2021  ? HCT 38.4 (L) 05/21/2021  ? PLT 368.0 05/21/2021  ? GLUCOSE 121 (H) 05/21/2021  ? CHOL 134 05/21/2021  ? TRIG 200.0 (H) 05/21/2021  ? HDL 39.00 (L) 05/21/2021  ? LDLDIRECT 101.3 01/28/2008  ? LDLCALC 55 05/21/2021  ? ALT 33 05/21/2021  ? AST 17 05/21/2021  ? NA 131 (L) 05/21/2021  ? K 4.1 05/21/2021  ? CL 91 (L) 05/21/2021  ? CREATININE 0.83 05/21/2021  ? BUN 13 05/21/2021  ? CO2 29 05/21/2021  ? TSH 1.72 05/21/2021  ? PSA 0.25 06/25/2020  ? HGBA1C 8.2 (H) 05/21/2021  ? MICROALBUR 2.4 (H) 08/06/2018  ? ? ?Lab Results  ?Component Value Date  ? TSH 1.72 05/21/2021  ? ?Lab Results  ?Component Value Date  ? WBC 8.6 05/21/2021  ? HGB 13.1 05/21/2021  ? HCT 38.4 (L) 05/21/2021  ? MCV 94.2 05/21/2021  ? PLT 368.0 05/21/2021  ? ?Lab Results  ?Component Value Date  ? NA 131 (L) 05/21/2021  ? K 4.1 05/21/2021  ? CO2 29 05/21/2021  ? GLUCOSE 121 (H) 05/21/2021  ? BUN 13 05/21/2021  ? CREATININE 0.83 05/21/2021  ? BILITOT 0.4 05/21/2021  ? ALKPHOS 66 05/21/2021  ? AST 17 05/21/2021  ?  ALT 33 05/21/2021  ? PROT 7.1 05/21/2021  ? ALBUMIN 4.5 05/21/2021  ? CALCIUM 9.7  05/21/2021  ? ANIONGAP 6 02/10/2018  ? GFR 92.85 05/21/2021  ? ?Lab Results  ?Component Value Date  ? CHOL 134 05/21/2021  ? ?Lab Results  ?Component Value Date  ? HDL 39.00 (L) 05/21/2021  ? ?Lab Results  ?Component Value Date  ? LDLCALC 55 05/21/2021  ? ?Lab Results  ?Component Value Date  ? TRIG 200.0 (H) 05/21/2021  ? ?Lab Results  ?Component Value Date  ? CHOLHDL 3 05/21/2021  ? ?Lab Results  ?Component Value Date  ? HGBA1C 8.2 (H) 05/21/2021  ? ? ?   ?Assessment & Plan:  ? ?Problem List Items Addressed This Visit   ?None ? ? ?I am having Phillip Doyle maintain his Multiple Vitamins-Minerals (PA MENS 50 PLUS VITAPAK PO), acetaminophen, freestyle, FREESTYLE LITE, albuterol, Oral Electrolytes (H-E-B ORAL ELECTROLYTE PO), metFORMIN, simvastatin, glimepiride, Ozempic (0.25 or 0.5 MG/DOSE), lisinopril-hydrochlorothiazide, and metoprolol. ? ?No orders of the defined types were placed in this encounter. ? ? ? ? ?

## 2021-11-19 ENCOUNTER — Encounter: Payer: Self-pay | Admitting: Family Medicine

## 2021-11-19 ENCOUNTER — Ambulatory Visit (INDEPENDENT_AMBULATORY_CARE_PROVIDER_SITE_OTHER): Payer: BC Managed Care – PPO | Admitting: Family Medicine

## 2021-11-19 VITALS — BP 136/72 | HR 90 | Ht 69.0 in | Wt 241.0 lb

## 2021-11-19 DIAGNOSIS — E782 Mixed hyperlipidemia: Secondary | ICD-10-CM

## 2021-11-19 DIAGNOSIS — E6609 Other obesity due to excess calories: Secondary | ICD-10-CM | POA: Diagnosis not present

## 2021-11-19 DIAGNOSIS — E669 Obesity, unspecified: Secondary | ICD-10-CM | POA: Diagnosis not present

## 2021-11-19 DIAGNOSIS — F1011 Alcohol abuse, in remission: Secondary | ICD-10-CM

## 2021-11-19 DIAGNOSIS — I10 Essential (primary) hypertension: Secondary | ICD-10-CM

## 2021-11-19 DIAGNOSIS — E871 Hypo-osmolality and hyponatremia: Secondary | ICD-10-CM

## 2021-11-19 DIAGNOSIS — Z0000 Encounter for general adult medical examination without abnormal findings: Secondary | ICD-10-CM | POA: Diagnosis not present

## 2021-11-19 DIAGNOSIS — N529 Male erectile dysfunction, unspecified: Secondary | ICD-10-CM

## 2021-11-19 DIAGNOSIS — E1169 Type 2 diabetes mellitus with other specified complication: Secondary | ICD-10-CM | POA: Diagnosis not present

## 2021-11-19 MED ORDER — OZEMPIC (0.25 OR 0.5 MG/DOSE) 2 MG/1.5ML ~~LOC~~ SOPN: 0.5000 mg | PEN_INJECTOR | SUBCUTANEOUS | 0 refills | Status: AC

## 2021-11-19 MED ORDER — SEMAGLUTIDE (1 MG/DOSE) 4 MG/3ML ~~LOC~~ SOPN: 1.0000 mg | PEN_INJECTOR | SUBCUTANEOUS | 0 refills | Status: AC

## 2021-11-19 MED ORDER — OZEMPIC (2 MG/DOSE) 8 MG/3ML ~~LOC~~ SOPN: 2.0000 mg | PEN_INJECTOR | SUBCUTANEOUS | 1 refills | Status: DC

## 2021-11-19 NOTE — Progress Notes (Signed)
? ?Subjective:  ? ?By signing my name below, I, Carylon Perches, attest that this documentation has been prepared under the direction and in the presence of Penni Homans MD, 11/19/2021 ? ? Patient ID: Phillip Doyle, male    DOB: Jan 13, 1956, 66 y.o.   MRN: [id number] ? ?Chief Complaint  ?Patient presents with  ? Follow-up  ? ? ?HPI ?Patient is in today for an office visit. ? ?He reports no recent febrile illness or hospitalizations. ? ?As of today's visit, his blood pressure is increasing. His blood pressure is 136/72 and his pulse is 90 . He denies of any headaches, stress or chest pains. He has been getting restful sleep. He does not regularly consume alcohol. ?BP Readings from Last 3 Encounters:  ?11/19/21 136/72  ?05/21/21 134/78  ?06/15/20 137/79  ? ?Pulse Readings from Last 3 Encounters:  ?11/19/21 90  ?05/21/21 (!) 104  ?06/15/20 98  ? ?He states that he cannot get his blood sugar levels consistently below 125. He reports within the last month that his sugars range from 120 - 168. He denies of urinary frequency. He drinks about 32 oz daily and additional water during the evening. His diet has been well. He is currently taking 0.25 MG/DOS of Ozempic.  ?Lab Results  ?Component Value Date  ? HGBA1C 8.2 (H) 05/21/2021  ? ?He is UTD on Pneumonia and TDAP vaccine. He is scheduled to receive the Shingles vaccine in May or June.   ? ?He denies any CP/palp/SOB/HA/congestion/fevers/GI or GU c/o. He is taking medications as prescribed. ? ?Past Medical History:  ?Diagnosis Date  ? Alcohol abuse 11/28/2013  ? ALLERGIC RHINITIS 02/15/2007  ? Allergy   ? rhinitis  ? Bronchitis 07/14/2012  ? Diabetes mellitus   ? DM 10/24/2010  ? ED (erectile dysfunction) 02/17/2012  ? EXOGENOUS OBESITY 02/15/2007  ? H/O alcohol abuse 11/28/2013  ? H/O tobacco use, presenting hazards to health 07/04/2010  ? Qualifier: Diagnosis of  By: [name] MD, Erline Levine  1/2 ppd   ? HTN (hypertension) 08/20/2011  ? Hypertension   ? HYPERTENSION 02/15/2007  ? Impaired glucose  tolerance   ? Low testosterone in male 08/01/2016  ? Low testosterone level in male 08/01/2016  ? LUMBAR STRAIN 10/17/2010  ? Mixed hyperlipidemia 07/04/2010  ? NISSEN FUNDOPLICATION, HX OF 6/56/8127  ? OA (osteoarthritis) of knee 02/17/2012  ? Obesity   ? PEPTIC ULCER DISEASE 02/15/2007  ? Personal history of colonic polyps 01/17/2013  ? colonoscoppy 2013, benign polyp advised repeat in 10 years  ? Preventative health care 07/19/2012  ? Restless sleeper 10/30/2016  ? TOBACCO ABUSE 07/04/2010  ? Ulcer 1982  ? peptic ulcer disease  ? ? ?Past Surgical History:  ?Procedure Laterality Date  ? COLONOSCOPY  03/2012  ? Several hyperplastic polyps  ? fundiplication for [facility] and reflux  1987  ? negative stress test  2008  ? ROTATOR CUFF REPAIR  2000  ? VASECTOMY  1982  ? ? ?Family History  ?Problem Relation Age of Onset  ? Cancer Mother   ?     uterine   ? Asthma Mother   ? Alcohol abuse Father   ? Cancer Sister   ?     Brain tumor  ? Hypertension Brother   ? Diabetes Brother   ? Heart disease Brother   ?     aortic valve replaced, CAD  ? Cancer Brother   ?     prostate  ? Stroke Maternal Grandmother   ?  possibly  ? Hypertension Maternal Grandfather   ? Colon cancer Neg Hx   ? ? ?Social History  ? ?Socioeconomic History  ? Marital status: Married  ?  Spouse name: [name]  ? Number of children: Not on file  ? Years of education: Not on file  ? Highest education level: Not on file  ?Occupational History  ?  Employer: [employer]  ?Tobacco Use  ? Smoking status: Former  ?  Packs/day: 2.00  ?  Years: 40.00  ?  Pack years: 80.00  ?  Types: Cigarettes  ?  Quit date: 06/28/2014  ?  Years since quitting: 7.4  ? Smokeless tobacco: Former  ?  Quit date: 03/18/2012  ? Tobacco comments:  ?  quit 06/18/10- started back   ?Vaping Use  ? Vaping Use: Not on file  ?Substance and Sexual Activity  ? Alcohol use: Yes  ?  Alcohol/week: 3.0 - 4.0 standard drinks  ?  Types: 3 - 4 Cans of beer per week  ?  Comment: 3-4 every other day  ? Drug use: No  ?  Sexual activity: Yes  ?  Partners: Female  ?Other Topics Concern  ? Not on file  ?Social History Narrative  ? Not on file  ? ?Social Determinants of Health  ? ?Financial Resource Strain: Not on file  ?Food Insecurity: Not on file  ?Transportation Needs: Not on file  ?Physical Activity: Not on file  ?Stress: Not on file  ?Social Connections: Not on file  ?Intimate Partner Violence: Not on file  ? ? ?Outpatient Medications Prior to Visit  ?Medication Sig Dispense Refill  ? acetaminophen (TYLENOL) 650 MG CR tablet Take 1,300 mg by mouth 2 (two) times daily as needed for pain.    ? albuterol (VENTOLIN HFA) 108 (90 Base) MCG/ACT inhaler TAKE 2 PUFFS INTO LUNGS EVERY 6 HOURS AS NEEDED FOR WHEEZE OR SHORTNESS OF BREATH 18 each 12  ? FREESTYLE LITE test strip CHECK BLOOD SUGAR TWICE DAILY. DX: E11.9**VERIFY PATIENTS METER** 100 strip 0  ? glimepiride (AMARYL) 1 MG tablet TAKE 1 TABLET BY MOUTH EVERY DAY WITH BREAKFAST 90 tablet 1  ? Lancets (FREESTYLE) lancets Check blood sugar twice daily.  DX E11.9 100 each 6  ? lisinopril-hydrochlorothiazide (ZESTORETIC) 20-12.5 MG tablet TAKE 1 TABLET BY MOUTH TWICE A DAY 180 tablet 1  ? metFORMIN (GLUCOPHAGE) 1000 MG tablet TAKE 1 TABLET BY MOUTH TWICE A DAY WITH MEALS 180 tablet 1  ? metoprolol (TOPROL-XL) 200 MG 24 hr tablet TAKE 1 TABLET BY MOUTH EVERY DAY 90 tablet 1  ? Multiple Vitamins-Minerals (PA MENS 50 PLUS VITAPAK PO) Take 1 capsule by mouth daily.    ? Oral Electrolytes (H-E-B ORAL ELECTROLYTE PO) Take by mouth.    ? simvastatin (ZOCOR) 10 MG tablet TAKE 1 TABLET BY MOUTH EVERY DAY AT 6PM 90 tablet 1  ? Semaglutide,0.25 or 0.'5MG'$ /DOS, (OZEMPIC, 0.25 OR 0.5 MG/DOSE,) 2 MG/1.5ML SOPN Inject 0.25 mg into the skin once a week. 9 mL 0  ? ?No facility-administered medications prior to visit.  ? ? ?No Known Allergies ? ?Review of Systems  ?Constitutional:  Negative for fever.  ?HENT:  Negative for congestion.   ?Respiratory:  Negative for shortness of breath.   ?Cardiovascular:   Negative for chest pain and palpitations.  ?Gastrointestinal: Negative.   ?Genitourinary: Negative.   ?Neurological:  Negative for headaches.  ? ?   ?Objective:  ?  ?Physical Exam ?Constitutional:   ?   General: He is not  in acute distress. ?   Appearance: Normal appearance. He is not ill-appearing.  ?HENT:  ?   Head: Normocephalic and atraumatic.  ?   Right Ear: External ear normal.  ?   Left Ear: External ear normal.  ?Eyes:  ?   Extraocular Movements: Extraocular movements intact.  ?   Pupils: Pupils are equal, round, and reactive to light.  ?Cardiovascular:  ?   Rate and Rhythm: Normal rate and regular rhythm.  ?   Heart sounds: Normal heart sounds. No murmur heard. ?  No gallop.  ?Pulmonary:  ?   Effort: Pulmonary effort is normal. No respiratory distress.  ?   Breath sounds: Normal breath sounds. No wheezing or rales.  ?Skin: ?   General: Skin is warm and dry.  ?Neurological:  ?   Mental Status: He is alert and oriented to person, place, and time.  ?Psychiatric:     ?   Judgment: Judgment normal.  ? ? ?BP 136/72   Pulse 90   Ht '5\' 9"'$  (1.753 m)   Wt 241 lb (109.3 kg)   SpO2 96%   BMI 35.59 kg/m?  ?Wt Readings from Last 3 Encounters:  ?11/19/21 241 lb (109.3 kg)  ?05/21/21 246 lb 12.8 oz (111.9 kg)  ?06/15/20 232 lb (105.2 kg)  ? ? ?Diabetic Foot Exam - Simple   ?No data filed ?  ? ?Lab Results  ?Component Value Date  ? WBC 8.6 05/21/2021  ? HGB 13.1 05/21/2021  ? HCT 38.4 (L) 05/21/2021  ? PLT 368.0 05/21/2021  ? GLUCOSE 121 (H) 05/21/2021  ? CHOL 134 05/21/2021  ? TRIG 200.0 (H) 05/21/2021  ? HDL 39.00 (L) 05/21/2021  ? LDLDIRECT 101.3 01/28/2008  ? LDLCALC 55 05/21/2021  ? ALT 33 05/21/2021  ? AST 17 05/21/2021  ? NA 131 (L) 05/21/2021  ? K 4.1 05/21/2021  ? CL 91 (L) 05/21/2021  ? CREATININE 0.83 05/21/2021  ? BUN 13 05/21/2021  ? CO2 29 05/21/2021  ? TSH 1.72 05/21/2021  ? PSA 0.25 06/25/2020  ? HGBA1C 8.2 (H) 05/21/2021  ? MICROALBUR 2.4 (H) 08/06/2018  ? ? ?Lab Results  ?Component Value Date  ? TSH  1.72 05/21/2021  ? ?Lab Results  ?Component Value Date  ? WBC 8.6 05/21/2021  ? HGB 13.1 05/21/2021  ? HCT 38.4 (L) 05/21/2021  ? MCV 94.2 05/21/2021  ? PLT 368.0 05/21/2021  ? ?Lab Results  ?Component Value D

## 2021-11-19 NOTE — Assessment & Plan Note (Signed)
Improved on recheck he agrees to keep an eye on his bp and pulse and send [place] home numbers at risk or sooner if they spike high and or he is asymptomatic.  ?

## 2021-11-19 NOTE — Assessment & Plan Note (Signed)
Encouraged DASH or MIND diet, decrease po intake and increase exercise as tolerated. Needs 7-8 hours of sleep nightly. Avoid trans fats, eat small, frequent meals every 4-5 hours with lean proteins, complex carbs and healthy fats. Minimize simple carbs, high fat foods and processed foods 

## 2021-11-19 NOTE — Assessment & Plan Note (Signed)
hgba1c unacceptable, minimize simple carbs. Increase exercise as tolerated. Continue current meds but increase Ozempic to 0.5 weekly for a month then 1 mg weekly for a month then 2 mg weekly ?

## 2021-11-19 NOTE — Assessment & Plan Note (Signed)
Encourage heart healthy diet such as MIND or DASH diet, increase exercise, avoid trans fats, simple carbohydrates and processed foods, consider a krill or fish or flaxseed oil cap daily.  °

## 2021-11-19 NOTE — Patient Instructions (Addendum)
Shingrix is the new shingles shot, 2 shots over 2-6 months, confirm coverage with insurance and document, then can return here for shots with nurse appt or at pharmacy  ? ?Hypertension, Adult ?High blood pressure (hypertension) is when the force of blood pumping through the arteries is too strong. The arteries are the blood vessels that carry blood from the heart throughout the body. Hypertension forces the heart to work harder to pump blood and may cause arteries to become narrow or stiff. Untreated or uncontrolled hypertension can cause a heart attack, heart failure, a stroke, kidney disease, and other problems. ?A blood pressure reading consists of a higher number over a lower number. Ideally, your blood pressure should be below 120/80. The first ("top") number is called the systolic pressure. It is a measure of the pressure in your arteries as your heart beats. The second ("bottom") number is called the diastolic pressure. It is a measure of the pressure in your arteries as the heart relaxes. ?What are the causes? ?The exact cause of this condition is not known. There are some conditions that result in or are related to high blood pressure. ?What increases the risk? ?Some risk factors for high blood pressure are under your control. The following factors may make you more likely to develop this condition: ?Smoking. ?Having type 2 diabetes mellitus, high cholesterol, or both. ?Not getting enough exercise or physical activity. ?Being overweight. ?Having too much fat, sugar, calories, or salt (sodium) in your diet. ?Drinking too much alcohol. ?Some risk factors for high blood pressure may be difficult or impossible to change. Some of these factors include: ?Having chronic kidney disease. ?Having a family history of high blood pressure. ?Age. Risk increases with age. ?Race. You may be at higher risk if you are African American. ?Gender. Men are at higher risk than women before age 41. After age 49, women are at  higher risk than men. ?Having obstructive sleep apnea. ?Stress. ?What are the signs or symptoms? ?High blood pressure may not cause symptoms. Very high blood pressure (hypertensive crisis) may cause: ?Headache. ?Anxiety. ?Shortness of breath. ?Nosebleed. ?Nausea and vomiting. ?Vision changes. ?Severe chest pain. ?Seizures. ?How is this diagnosed? ?This condition is diagnosed by measuring your blood pressure while you are seated, with your arm resting on a flat surface, your legs uncrossed, and your feet flat on the floor. The cuff of the blood pressure monitor will be placed directly against the skin of your upper arm at the level of your heart. It should be measured at least twice using the same arm. Certain conditions can cause a difference in blood pressure between your right and left arms. ?Certain factors can cause blood pressure readings to be lower or higher than normal for a short period of time: ?When your blood pressure is higher when you are in a health care provider's office than when you are at home, this is called white coat hypertension. Most people with this condition do not need medicines. ?When your blood pressure is higher at home than when you are in a health care provider's office, this is called masked hypertension. Most people with this condition may need medicines to control blood pressure. ?If you have a high blood pressure reading during one visit or you have normal blood pressure with other risk factors, you may be asked to: ?Return on a different day to have your blood pressure checked again. ?Monitor your blood pressure at home for 1 week or longer. ?If you are diagnosed with hypertension,  you may have other blood or imaging tests to help your health care provider understand your overall risk for other conditions. ?How is this treated? ?This condition is treated by making healthy lifestyle changes, such as eating healthy foods, exercising more, and reducing your alcohol intake. Your  health care provider may prescribe medicine if lifestyle changes are not enough to get your blood pressure under control, and if: ?Your systolic blood pressure is above 130. ?Your diastolic blood pressure is above 80. ?Your personal target blood pressure may vary depending on your medical conditions, your age, and other factors. ?Follow these instructions at home: ?Eating and drinking ? ?Eat a diet that is high in fiber and potassium, and low in sodium, added sugar, and fat. An example eating plan is called the DASH (Dietary Approaches to Stop Hypertension) diet. To eat this way: ?Eat plenty of fresh fruits and vegetables. Try to fill one half of your plate at each meal with fruits and vegetables. ?Eat whole grains, such as whole-wheat pasta, brown rice, or whole-grain bread. Fill about one fourth of your plate with whole grains. ?Eat or drink low-fat dairy products, such as skim milk or low-fat yogurt. ?Avoid fatty cuts of meat, processed or cured meats, and poultry with skin. Fill about one fourth of your plate with lean proteins, such as fish, chicken without skin, beans, eggs, or tofu. ?Avoid pre-made and processed foods. These tend to be higher in sodium, added sugar, and fat. ?Reduce your daily sodium intake. Most people with hypertension should eat less than 1,500 mg of sodium a day. ?Do not drink alcohol if: ?Your health care provider tells you not to drink. ?You are pregnant, may be pregnant, or are planning to become pregnant. ?If you drink alcohol: ?Limit how much you use to: ?0-1 drink a day for women. ?0-2 drinks a day for men. ?Be aware of how much alcohol is in your drink. In the [place] one drink equals one 12 oz bottle of beer (355 mL), one 5 oz glass of wine (148 mL), or one 1? oz glass of hard liquor (44 mL). ?Lifestyle ? ?Work with your health care provider to maintain a healthy body weight or to lose weight. Ask what an ideal weight is for you. ?Get at least 30 minutes of exercise most days of the  week. Activities may include walking, swimming, or biking. ?Include exercise to strengthen your muscles (resistance exercise), such as Pilates or lifting weights, as part of your weekly exercise routine. Try to do these types of exercises for 30 minutes at least 3 days a week. ?Do not use any products that contain nicotine or tobacco, such as cigarettes, e-cigarettes, and chewing tobacco. If you need help quitting, ask your health care provider. ?Monitor your blood pressure at home as told by your health care provider. ?Keep all follow-up visits as told by your health care provider. This is important. ?Medicines ?Take over-the-counter and prescription medicines only as told by your health care provider. Follow directions carefully. Blood pressure medicines must be taken as prescribed. ?Do not skip doses of blood pressure medicine. Doing this puts you at risk for problems and can make the medicine less effective. ?Ask your health care provider about side effects or reactions to medicines that you should watch for. ?Contact a health care provider if you: ?Think you are having a reaction to a medicine you are taking. ?Have headaches that keep coming back (recurring). ?Feel dizzy. ?Have swelling in your ankles. ?Have trouble with your vision. ?  Get help right away if you: ?Develop a severe headache or confusion. ?Have unusual weakness or numbness. ?Feel faint. ?Have severe pain in your chest or abdomen. ?Vomit repeatedly. ?Have trouble breathing. ?Summary ?Hypertension is when the force of blood pumping through your arteries is too strong. If this condition is not controlled, it may put you at risk for serious complications. ?Your personal target blood pressure may vary depending on your medical conditions, your age, and other factors. For most people, a normal blood pressure is less than 120/80. ?Hypertension is treated with lifestyle changes, medicines, or a combination of both. Lifestyle changes include losing weight,  eating a healthy, low-sodium diet, exercising more, and limiting alcohol. ?This information is not intended to replace advice given to you by your health care provider. Make sure you discuss any questions yo

## 2021-11-20 LAB — CBC WITH DIFFERENTIAL/PLATELET
Basophils Absolute: 0.1 10*3/uL (ref 0.0–0.1)
Basophils Relative: 0.9 % (ref 0.0–3.0)
Eosinophils Absolute: 0.5 10*3/uL (ref 0.0–0.7)
Eosinophils Relative: 5.4 % — ABNORMAL HIGH (ref 0.0–5.0)
HCT: 38.4 % — ABNORMAL LOW (ref 39.0–52.0)
Hemoglobin: 13.4 g/dL (ref 13.0–17.0)
Lymphocytes Relative: 24.6 % (ref 12.0–46.0)
Lymphs Abs: 2.1 10*3/uL (ref 0.7–4.0)
MCHC: 34.9 g/dL (ref 30.0–36.0)
MCV: 92.5 fl (ref 78.0–100.0)
Monocytes Absolute: 0.9 10*3/uL (ref 0.1–1.0)
Monocytes Relative: 10.2 % (ref 3.0–12.0)
Neutro Abs: 5 10*3/uL (ref 1.4–7.7)
Neutrophils Relative %: 58.9 % (ref 43.0–77.0)
Platelets: 348 10*3/uL (ref 150.0–400.0)
RBC: 4.15 Mil/uL — ABNORMAL LOW (ref 4.22–5.81)
RDW: 13.2 % (ref 11.5–15.5)
WBC: 8.6 10*3/uL (ref 4.0–10.5)

## 2021-11-20 LAB — COMPREHENSIVE METABOLIC PANEL
ALT: 26 U/L (ref 0–53)
AST: 15 U/L (ref 0–37)
Albumin: 4.5 g/dL (ref 3.5–5.2)
Alkaline Phosphatase: 82 U/L (ref 39–117)
BUN: 13 mg/dL (ref 6–23)
CO2: 26 mEq/L (ref 19–32)
Calcium: 9.5 mg/dL (ref 8.4–10.5)
Chloride: 90 mEq/L — ABNORMAL LOW (ref 96–112)
Creatinine, Ser: 0.92 mg/dL (ref 0.40–1.50)
GFR: 87.94 mL/min (ref >60.00)
Glucose, Bld: 266 mg/dL — ABNORMAL HIGH (ref 70–99)
Potassium: 3.7 mEq/L (ref 3.5–5.1)
Sodium: 126 mEq/L — ABNORMAL LOW (ref 135–145)
Total Bilirubin: 0.4 mg/dL (ref 0.2–1.2)
Total Protein: 7 g/dL (ref 6.0–8.3)

## 2021-11-20 LAB — LIPID PANEL
Cholesterol: 124 mg/dL (ref 0–200)
HDL: 39.8 mg/dL (ref >39.00)
NonHDL: 84.6
Total CHOL/HDL Ratio: 3
Triglycerides: 204 mg/dL — ABNORMAL HIGH (ref 0.0–149.0)
VLDL: 40.8 mg/dL — ABNORMAL HIGH (ref 0.0–40.0)

## 2021-11-20 LAB — MICROALBUMIN / CREATININE URINE RATIO
Creatinine,U: 102.9 mg/dL
Microalb Creat Ratio: 9.4 mg/g (ref 0.0–30.0)
Microalb, Ur: 9.7 mg/dL — ABNORMAL HIGH (ref 0.0–1.9)

## 2021-11-20 LAB — LDL CHOLESTEROL, DIRECT: Direct LDL: 65 mg/dL

## 2021-11-20 LAB — TSH: TSH: 1.27 u[IU]/mL (ref 0.35–5.50)

## 2021-11-20 LAB — HEMOGLOBIN A1C: Hgb A1c MFr Bld: 7.8 % — ABNORMAL HIGH (ref 4.6–6.5)

## 2021-11-21 ENCOUNTER — Other Ambulatory Visit: Payer: Self-pay

## 2021-11-21 MED ORDER — SIMVASTATIN 20 MG PO TABS: ORAL_TABLET | ORAL | 1 refills | Status: DC

## 2021-12-25 ENCOUNTER — Other Ambulatory Visit: Payer: Self-pay | Admitting: Family Medicine

## 2022-01-07 ENCOUNTER — Other Ambulatory Visit: Payer: Self-pay | Admitting: Family Medicine

## 2022-01-08 ENCOUNTER — Other Ambulatory Visit: Payer: Self-pay | Admitting: Family Medicine

## 2022-02-24 NOTE — Progress Notes (Unsigned)
Subjective:    Patient ID: Phillip Doyle, male    DOB: [birth year], 66 y.o.   MRN: [id number]  No chief complaint on file.   HPI Patient is in today for a follow up.  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/01/2016   Low testosterone level in male 08/01/2016   LUMBAR STRAIN 10/17/2010   Mixed hyperlipidemia 40/98/1191   NISSEN FUNDOPLICATION, HX OF 4/78/2956   OA (osteoarthritis) of knee 02/17/2012   Obesity    PEPTIC ULCER DISEASE 02/15/2007   Personal history of colonic polyps 01/17/2013   colonoscoppy 2013, benign polyp advised repeat in 10 years   Preventative health care 07/19/2012   Restless sleeper 10/30/2016   TOBACCO ABUSE 07/04/2010   Ulcer 1982   peptic ulcer disease    Past Surgical History:  Procedure Laterality Date   COLONOSCOPY  03/2012   Several hyperplastic polyps   fundiplication for [facility] and reflux  1987   negative stress test  2008   ROTATOR CUFF REPAIR  2000   VASECTOMY  1982    Family History  Problem Relation Age of Onset   Cancer Mother        uterine    Asthma Mother    Alcohol abuse Father    Cancer Sister        Brain tumor   Hypertension Brother    Diabetes Brother    Heart disease Brother        aortic valve replaced, CAD   Cancer Brother        prostate   Stroke Maternal Grandmother        possibly   Hypertension Maternal Grandfather    Colon cancer Neg Hx     Social History   Socioeconomic History   Marital status: Married    Spouse name: Jaqui    Number of children: Not on file   Years of education: Not on file   Highest education level: Not on file  Occupational History    Employer: [employer]  Tobacco Use   Smoking status: Former    Packs/day: 2.00    Years: 40.00    Total pack years: 80.00    Types: Cigarettes    Quit date: 06/28/2014    Years since quitting: 7.6   Smokeless tobacco: Former    Quit date: 03/18/2012   Tobacco comments:    quit 06/18/10- started back   Vaping Use   Vaping Use: Not on file  Substance and Sexual Activity   Alcohol use: Yes    Alcohol/week: 3.0 - 4.0 standard drinks of alcohol    Types: 3 - 4 Cans of beer per week    Comment: 3-4 every other day   Drug use: No   Sexual activity: Yes    Partners: Female  Other Topics Concern   Not on file  Social History Narrative   Not on file   Social Determinants of Health  Financial Resource Strain: Not on file  Food Insecurity: Not on file  Transportation Needs: Not on file  Physical Activity: Not on file  Stress: Not on file  Social Connections: Not on file  Intimate Partner Violence: Not on file    Outpatient Medications Prior to Visit  Medication Sig Dispense Refill   acetaminophen (TYLENOL) 650 MG CR tablet Take 1,300 mg by mouth 2 (two) times daily as needed for pain.     albuterol (VENTOLIN HFA) 108 (90 Base) MCG/ACT inhaler TAKE 2 PUFFS INTO LUNGS EVERY 6 HOURS AS NEEDED FOR WHEEZE OR SHORTNESS OF BREATH 18 each 12   FREESTYLE LITE test strip CHECK BLOOD SUGAR TWICE DAILY. DX: E11.9**VERIFY PATIENTS METER** 100 strip 0   glimepiride (AMARYL) 1 MG tablet TAKE 1 TABLET BY MOUTH EVERY DAY WITH BREAKFAST 90 tablet 1   Lancets (FREESTYLE) lancets Check blood sugar twice daily.  DX E11.9 100 each 6   lisinopril-hydrochlorothiazide (ZESTORETIC) 20-12.5 MG tablet TAKE 1 TABLET BY MOUTH TWICE A DAY 180 tablet 1   metFORMIN (GLUCOPHAGE) 1000 MG tablet TAKE 1 TABLET BY MOUTH TWICE A DAY WITH MEALS 180 tablet 1   metoprolol (TOPROL-XL) 200  MG 24 hr tablet TAKE 1 TABLET BY MOUTH EVERY DAY 90 tablet 1   Multiple Vitamins-Minerals (PA MENS 50 PLUS VITAPAK PO) Take 1 capsule by mouth daily.     Oral Electrolytes (H-E-B ORAL ELECTROLYTE PO) Take by mouth.     simvastatin (ZOCOR) 20 MG tablet TAKE 1 TABLET BY MOUTH EVERY DAY AT 6PM 90 tablet 1   No facility-administered medications prior to visit.    No Known Allergies  ROS     Objective:    Physical Exam  There were no vitals taken for this visit. Wt Readings from Last 3 Encounters:  11/19/21 241 lb (109.3 kg)  05/21/21 246 lb 12.8 oz (111.9 kg)  06/15/20 232 lb (105.2 kg)    Diabetic Foot Exam - Simple   No data filed    Lab Results  Component Value Date   WBC 8.6 11/19/2021   HGB 13.4 11/19/2021   HCT 38.4 (L) 11/19/2021   PLT 348.0 11/19/2021   GLUCOSE 266 (H) 11/19/2021   CHOL 124 11/19/2021   TRIG 204.0 (H) 11/19/2021   HDL 39.80 11/19/2021   LDLDIRECT 65.0 11/19/2021   LDLCALC 55 05/21/2021   ALT 26 11/19/2021   AST 15 11/19/2021   NA 126 (L) 11/19/2021   K 3.7 11/19/2021   CL 90 (L) 11/19/2021   CREATININE 0.92 11/19/2021   BUN 13 11/19/2021   CO2 26 11/19/2021   TSH 1.27 11/19/2021   PSA 0.25 06/25/2020   HGBA1C 7.8 (H) 11/19/2021   MICROALBUR 9.7 (H) 11/19/2021    Lab Results  Component Value Date   TSH 1.27 11/19/2021   Lab Results  Component Value Date   WBC 8.6 11/19/2021   HGB 13.4 11/19/2021   HCT 38.4 (L) 11/19/2021   MCV 92.5 11/19/2021   PLT 348.0 11/19/2021   Lab Results  Component Value Date   NA 126 (L) 11/19/2021   K 3.7 11/19/2021   CO2 26 11/19/2021   GLUCOSE 266 (H) 11/19/2021   BUN 13 11/19/2021   CREATININE 0.92 11/19/2021   BILITOT 0.4 11/19/2021   ALKPHOS 82 11/19/2021   AST 15 11/19/2021   ALT 26 11/19/2021   PROT 7.0 11/19/2021   ALBUMIN 4.5 11/19/2021   CALCIUM 9.5 11/19/2021   ANIONGAP 6 02/10/2018   GFR 87.94  11/19/2021   Lab Results  Component Value Date   CHOL 124 11/19/2021   Lab  Results  Component Value Date   HDL 39.80 11/19/2021   Lab Results  Component Value Date   LDLCALC 55 05/21/2021   Lab Results  Component Value Date   TRIG 204.0 (H) 11/19/2021   Lab Results  Component Value Date   CHOLHDL 3 11/19/2021   Lab Results  Component Value Date   HGBA1C 7.8 (H) 11/19/2021       Assessment & Plan:      Problem List Items Addressed This Visit   None   I am having Michell A. Mccarrell maintain his Multiple Vitamins-Minerals (PA MENS 50 PLUS VITAPAK PO), acetaminophen, freestyle, FREESTYLE LITE, albuterol, Oral Electrolytes (H-E-B ORAL ELECTROLYTE PO), simvastatin, metFORMIN, glimepiride, metoprolol, and lisinopril-hydrochlorothiazide.  No orders of the defined types were placed in this encounter.

## 2022-02-25 ENCOUNTER — Encounter: Payer: Self-pay | Admitting: Family Medicine

## 2022-02-25 ENCOUNTER — Ambulatory Visit (INDEPENDENT_AMBULATORY_CARE_PROVIDER_SITE_OTHER): Payer: BC Managed Care – PPO | Admitting: Family Medicine

## 2022-02-25 VITALS — BP 128/70 | HR 94 | Resp 20 | Ht 69.0 in | Wt 232.4 lb

## 2022-02-25 DIAGNOSIS — E782 Mixed hyperlipidemia: Secondary | ICD-10-CM

## 2022-02-25 DIAGNOSIS — H9193 Unspecified hearing loss, bilateral: Secondary | ICD-10-CM

## 2022-02-25 DIAGNOSIS — R7989 Other specified abnormal findings of blood chemistry: Secondary | ICD-10-CM

## 2022-02-25 DIAGNOSIS — H9190 Unspecified hearing loss, unspecified ear: Secondary | ICD-10-CM | POA: Insufficient documentation

## 2022-02-25 DIAGNOSIS — I10 Essential (primary) hypertension: Secondary | ICD-10-CM

## 2022-02-25 DIAGNOSIS — F1011 Alcohol abuse, in remission: Secondary | ICD-10-CM

## 2022-02-25 DIAGNOSIS — E669 Obesity, unspecified: Secondary | ICD-10-CM | POA: Diagnosis not present

## 2022-02-25 DIAGNOSIS — E6609 Other obesity due to excess calories: Secondary | ICD-10-CM | POA: Diagnosis not present

## 2022-02-25 DIAGNOSIS — F1010 Alcohol abuse, uncomplicated: Secondary | ICD-10-CM

## 2022-02-25 DIAGNOSIS — E1169 Type 2 diabetes mellitus with other specified complication: Secondary | ICD-10-CM | POA: Diagnosis not present

## 2022-02-25 LAB — COMPREHENSIVE METABOLIC PANEL
ALT: 32 U/L (ref 0–53)
AST: 16 U/L (ref 0–37)
Albumin: 4.7 g/dL (ref 3.5–5.2)
Alkaline Phosphatase: 64 U/L (ref 39–117)
BUN: 11 mg/dL (ref 6–23)
CO2: 31 mEq/L (ref 19–32)
Calcium: 9.9 mg/dL (ref 8.4–10.5)
Chloride: 88 mEq/L — ABNORMAL LOW (ref 96–112)
Creatinine, Ser: 0.78 mg/dL (ref 0.40–1.50)
GFR: 94.1 mL/min (ref >60.00)
Glucose, Bld: 92 mg/dL (ref 70–99)
Potassium: 4 mEq/L (ref 3.5–5.1)
Sodium: 127 mEq/L — ABNORMAL LOW (ref 135–145)
Total Bilirubin: 0.5 mg/dL (ref 0.2–1.2)
Total Protein: 7.4 g/dL (ref 6.0–8.3)

## 2022-02-25 LAB — LIPID PANEL
Cholesterol: 105 mg/dL (ref 0–200)
HDL: 36.7 mg/dL — ABNORMAL LOW (ref >39.00)
LDL Cholesterol: 40 mg/dL (ref 0–99)
NonHDL: 68.4
Total CHOL/HDL Ratio: 3
Triglycerides: 142 mg/dL (ref 0.0–149.0)
VLDL: 28.4 mg/dL (ref 0.0–40.0)

## 2022-02-25 LAB — CBC
HCT: 41 % (ref 39.0–52.0)
Hemoglobin: 13.9 g/dL (ref 13.0–17.0)
MCHC: 33.8 g/dL (ref 30.0–36.0)
MCV: 93.6 fl (ref 78.0–100.0)
Platelets: 368 10*3/uL (ref 150.0–400.0)
RBC: 4.38 Mil/uL (ref 4.22–5.81)
RDW: 13.5 % (ref 11.5–15.5)
WBC: 8.3 10*3/uL (ref 4.0–10.5)

## 2022-02-25 LAB — TSH: TSH: 1.29 u[IU]/mL (ref 0.35–5.50)

## 2022-02-25 LAB — HEMOGLOBIN A1C: Hgb A1c MFr Bld: 6.5 % (ref 4.6–6.5)

## 2022-02-25 LAB — TESTOSTERONE: Testosterone: 222.39 ng/dL — ABNORMAL LOW (ref 300.00–890.00)

## 2022-02-25 MED ORDER — OZEMPIC (2 MG/DOSE) 8 MG/3ML ~~LOC~~ SOPN: 2.0000 mg | PEN_INJECTOR | SUBCUTANEOUS | 5 refills | Status: DC

## 2022-02-25 NOTE — Assessment & Plan Note (Signed)
Was screened at work at [employer] annually and had a big drop this year so he now has hearing aides which are working well

## 2022-02-25 NOTE — Assessment & Plan Note (Signed)
Now down to rare use during the week and one or two on the weekend.

## 2022-02-25 NOTE — Patient Instructions (Signed)
Check with insurance regarding which topical testosterone they will cover.   Yerba Matte tea do not drink   Hypertension, Adult High blood pressure (hypertension) is when the force of blood pumping through the arteries is too strong. The arteries are the blood vessels that carry blood from the heart throughout the body. Hypertension forces the heart to work harder to pump blood and may cause arteries to become narrow or stiff. Untreated or uncontrolled hypertension can lead to a heart attack, heart failure, a stroke, kidney disease, and other problems. A blood pressure reading consists of a higher number over a lower number. Ideally, your blood pressure should be below 120/80. The first ("top") number is called the systolic pressure. It is a measure of the pressure in your arteries as your heart beats. The second ("bottom") number is called the diastolic pressure. It is a measure of the pressure in your arteries as the heart relaxes. What are the causes? The exact cause of this condition is not known. There are some conditions that result in high blood pressure. What increases the risk? Certain factors may make you more likely to develop high blood pressure. Some of these risk factors are under your control, including: Smoking. Not getting enough exercise or physical activity. Being overweight. Having too much fat, sugar, calories, or salt (sodium) in your diet. Drinking too much alcohol. Other risk factors include: Having a personal history of heart disease, diabetes, high cholesterol, or kidney disease. Stress. Having a family history of high blood pressure and high cholesterol. Having obstructive sleep apnea. Age. The risk increases with age. What are the signs or symptoms? High blood pressure may not cause symptoms. Very high blood pressure (hypertensive crisis) may cause: Headache. Fast or irregular heartbeats (palpitations). Shortness of breath. Nosebleed. Nausea and  vomiting. Vision changes. Severe chest pain, dizziness, and seizures. How is this diagnosed? This condition is diagnosed by measuring your blood pressure while you are seated, with your arm resting on a flat surface, your legs uncrossed, and your feet flat on the floor. The cuff of the blood pressure monitor will be placed directly against the skin of your upper arm at the level of your heart. Blood pressure should be measured at least twice using the same arm. Certain conditions can cause a difference in blood pressure between your right and left arms. If you have a high blood pressure reading during one visit or you have normal blood pressure with other risk factors, you may be asked to: Return on a different day to have your blood pressure checked again. Monitor your blood pressure at home for 1 week or longer. If you are diagnosed with hypertension, you may have other blood or imaging tests to help your health care provider understand your overall risk for other conditions. How is this treated? This condition is treated by making healthy lifestyle changes, such as eating healthy foods, exercising more, and reducing your alcohol intake. You may be referred for counseling on a healthy diet and physical activity. Your health care provider may prescribe medicine if lifestyle changes are not enough to get your blood pressure under control and if: Your systolic blood pressure is above 130. Your diastolic blood pressure is above 80. Your personal target blood pressure may vary depending on your medical conditions, your age, and other factors. Follow these instructions at home: Eating and drinking  Eat a diet that is high in fiber and potassium, and low in sodium, added sugar, and fat. An example of this  eating plan is called the DASH diet. DASH stands for Dietary Approaches to Stop Hypertension. To eat this way: Eat plenty of fresh fruits and vegetables. Try to fill one half of your plate at each  meal with fruits and vegetables. Eat whole grains, such as whole-wheat pasta, brown rice, or whole-grain bread. Fill about one fourth of your plate with whole grains. Eat or drink low-fat dairy products, such as skim milk or low-fat yogurt. Avoid fatty cuts of meat, processed or cured meats, and poultry with skin. Fill about one fourth of your plate with lean proteins, such as fish, chicken without skin, beans, eggs, or tofu. Avoid pre-made and processed foods. These tend to be higher in sodium, added sugar, and fat. Reduce your daily sodium intake. Many people with hypertension should eat less than 1,500 mg of sodium a day. Do not drink alcohol if: Your health care provider tells you not to drink. You are pregnant, may be pregnant, or are planning to become pregnant. If you drink alcohol: Limit how much you have to: 0-1 drink a day for women. 0-2 drinks a day for men. Know how much alcohol is in your drink. In the [place] one drink equals one 12 oz bottle of beer (355 mL), one 5 oz glass of wine (148 mL), or one 1 oz glass of hard liquor (44 mL). Lifestyle  Work with your health care provider to maintain a healthy body weight or to lose weight. Ask what an ideal weight is for you. Get at least 30 minutes of exercise that causes your heart to beat faster (aerobic exercise) most days of the week. Activities may include walking, swimming, or biking. Include exercise to strengthen your muscles (resistance exercise), such as Pilates or lifting weights, as part of your weekly exercise routine. Try to do these types of exercises for 30 minutes at least 3 days a week. Do not use any products that contain nicotine or tobacco. These products include cigarettes, chewing tobacco, and vaping devices, such as e-cigarettes. If you need help quitting, ask your health care provider. Monitor your blood pressure at home as told by your health care provider. Keep all follow-up visits. This is  important. Medicines Take over-the-counter and prescription medicines only as told by your health care provider. Follow directions carefully. Blood pressure medicines must be taken as prescribed. Do not skip doses of blood pressure medicine. Doing this puts you at risk for problems and can make the medicine less effective. Ask your health care provider about side effects or reactions to medicines that you should watch for. Contact a health care provider if you: Think you are having a reaction to a medicine you are taking. Have headaches that keep coming back (recurring). Feel dizzy. Have swelling in your ankles. Have trouble with your vision. Get help right away if you: Develop a severe headache or confusion. Have unusual weakness or numbness. Feel faint. Have severe pain in your chest or abdomen. Vomit repeatedly. Have trouble breathing. These symptoms may be an emergency. Get help right away. Call 911. Do not wait to see if the symptoms will go away. Do not drive yourself to the hospital. Summary Hypertension is when the force of blood pumping through your arteries is too strong. If this condition is not controlled, it may put you at risk for serious complications. Your personal target blood pressure may vary depending on your medical conditions, your age, and other factors. For most people, a normal blood pressure is less than 120/80.  Hypertension is treated with lifestyle changes, medicines, or a combination of both. Lifestyle changes include losing weight, eating a healthy, low-sodium diet, exercising more, and limiting alcohol. This information is not intended to replace advice given to you by your health care provider. Make sure you discuss any questions you have with your health care provider. Document Revised: 06/11/2021 Document Reviewed: 06/11/2021 Elsevier Patient Education  [place].

## 2022-02-25 NOTE — Assessment & Plan Note (Signed)
Good weight loss with Ozempic and dietary adjustments

## 2022-02-25 NOTE — Assessment & Plan Note (Signed)
Frustrated with fatigue and willing to consider topical treatment if testosterone is lower. Check level today

## 2022-02-25 NOTE — Assessment & Plan Note (Signed)
Has cut dramatically

## 2022-02-25 NOTE — Assessment & Plan Note (Signed)
Encourage heart healthy diet such as MIND or DASH diet, increase exercise, avoid trans fats, simple carbohydrates and processed foods, consider a krill or fish or flaxseed oil cap daily. Tolerating Simvastatin 

## 2022-02-25 NOTE — Assessment & Plan Note (Signed)
Well controlled, no changes to meds. Encouraged heart healthy diet such as the DASH diet and exercise as tolerated.  °

## 2022-02-25 NOTE — Assessment & Plan Note (Signed)
hgba1c acceptable, minimize simple carbs. Increase exercise as tolerated. Continue current meds 

## 2022-03-06 ENCOUNTER — Other Ambulatory Visit: Payer: Self-pay | Admitting: Family Medicine

## 2022-03-10 ENCOUNTER — Other Ambulatory Visit: Payer: Self-pay | Admitting: Family Medicine

## 2022-03-10 ENCOUNTER — Encounter: Payer: Self-pay | Admitting: Family Medicine

## 2022-03-10 MED ORDER — TESTOSTERONE 20.25 MG/1.25GM (1.62%) TD GEL: 20.2500 mg | Freq: Every day | TRANSDERMAL | 5 refills | Status: DC

## 2022-03-11 ENCOUNTER — Telehealth: Payer: Self-pay

## 2022-03-11 NOTE — Telephone Encounter (Signed)
PA submitted for androgel, awaiting reply from insurance company.

## 2022-03-12 ENCOUNTER — Other Ambulatory Visit: Payer: Self-pay | Admitting: Family Medicine

## 2022-03-12 MED ORDER — TESTOSTERONE 50 MG/5GM (1%) TD GEL: 5.0000 g | Freq: Every day | TRANSDERMAL | 2 refills | Status: DC

## 2022-03-12 NOTE — Telephone Encounter (Signed)
[facility]  received a request from your provider for coverage of Testosterone Transdermal Gel. The request was denied because: You do not meet the requirements of your plan. Your plan covers this drug when you do not have age-related hypogonadism. Your request has been denied based on the information we have.

## 2022-03-12 NOTE — Telephone Encounter (Signed)
His androgel was denied. Did you want to try a different one? He said they also might cover:  Androderm Vogelxo Fortesta Testim

## 2022-04-17 ENCOUNTER — Encounter: Payer: Self-pay | Admitting: Internal Medicine

## 2022-06-05 ENCOUNTER — Encounter: Payer: BC Managed Care – PPO | Admitting: Family Medicine

## 2022-06-05 ENCOUNTER — Other Ambulatory Visit: Payer: Self-pay | Admitting: Family Medicine

## 2022-06-05 ENCOUNTER — Encounter: Payer: Self-pay | Admitting: Family Medicine

## 2022-06-05 MED ORDER — OZEMPIC (2 MG/DOSE) 8 MG/3ML ~~LOC~~ SOPN
2.0000 mg | PEN_INJECTOR | SUBCUTANEOUS | 5 refills | Status: DC
Start: 2022-06-05 — End: 2022-06-17

## 2022-06-06 ENCOUNTER — Other Ambulatory Visit: Payer: Self-pay | Admitting: Family Medicine

## 2022-06-17 ENCOUNTER — Encounter: Payer: Self-pay | Admitting: Family Medicine

## 2022-06-17 MED ORDER — GLIMEPIRIDE 1 MG PO TABS: 1.0000 mg | ORAL_TABLET | Freq: Every day | ORAL | 1 refills | Status: DC

## 2022-06-17 MED ORDER — OZEMPIC (2 MG/DOSE) 8 MG/3ML ~~LOC~~ SOPN: 2.0000 mg | PEN_INJECTOR | SUBCUTANEOUS | 5 refills | Status: AC

## 2022-06-17 MED ORDER — SIMVASTATIN 20 MG PO TABS
20.0000 mg | ORAL_TABLET | Freq: Every day | ORAL | 1 refills | Status: DC
Start: 2022-06-17 — End: 2022-11-24

## 2022-06-17 MED ORDER — LISINOPRIL-HYDROCHLOROTHIAZIDE 20-12.5 MG PO TABS: 1.0000 | ORAL_TABLET | Freq: Two times a day (BID) | ORAL | 1 refills | Status: DC

## 2022-06-17 MED ORDER — METFORMIN HCL 1000 MG PO TABS: 1000.0000 mg | ORAL_TABLET | Freq: Two times a day (BID) | ORAL | 0 refills | Status: DC

## 2022-06-17 MED ORDER — METOPROLOL SUCCINATE ER 200 MG PO TB24: 200.0000 mg | ORAL_TABLET | Freq: Every day | ORAL | 1 refills | Status: DC

## 2022-07-03 ENCOUNTER — Other Ambulatory Visit: Payer: Self-pay | Admitting: Family Medicine

## 2022-07-07 ENCOUNTER — Telehealth: Payer: Self-pay | Admitting: Family Medicine

## 2022-07-07 NOTE — Telephone Encounter (Signed)
Pt's spouse dropped off copy of immunization records-[facility]. Pt would like for provider to see and have on pt's chart. Document put at front office tray under providers name.

## 2022-07-07 NOTE — Telephone Encounter (Signed)
Patient dropped off insurance forms to be filled out by provider, they were placed on provider's box.

## 2022-07-07 NOTE — Telephone Encounter (Signed)
Got them

## 2022-07-18 ENCOUNTER — Telehealth: Payer: Self-pay

## 2022-07-21 NOTE — Telephone Encounter (Signed)
Done

## 2022-09-16 ENCOUNTER — Other Ambulatory Visit (HOSPITAL_BASED_OUTPATIENT_CLINIC_OR_DEPARTMENT_OTHER): Payer: Self-pay

## 2022-09-16 ENCOUNTER — Other Ambulatory Visit (HOSPITAL_COMMUNITY): Payer: Self-pay

## 2022-09-16 ENCOUNTER — Ambulatory Visit (INDEPENDENT_AMBULATORY_CARE_PROVIDER_SITE_OTHER): Payer: BC Managed Care – PPO | Admitting: Family Medicine

## 2022-09-16 ENCOUNTER — Other Ambulatory Visit: Payer: Self-pay

## 2022-09-16 VITALS — BP 130/74 | HR 89 | Temp 98.0°F | Resp 16 | Ht 69.0 in | Wt 249.2 lb

## 2022-09-16 DIAGNOSIS — R7989 Other specified abnormal findings of blood chemistry: Secondary | ICD-10-CM | POA: Diagnosis not present

## 2022-09-16 DIAGNOSIS — E669 Obesity, unspecified: Secondary | ICD-10-CM

## 2022-09-16 DIAGNOSIS — E6609 Other obesity due to excess calories: Secondary | ICD-10-CM

## 2022-09-16 DIAGNOSIS — E782 Mixed hyperlipidemia: Secondary | ICD-10-CM

## 2022-09-16 DIAGNOSIS — E1169 Type 2 diabetes mellitus with other specified complication: Secondary | ICD-10-CM | POA: Diagnosis not present

## 2022-09-16 DIAGNOSIS — J439 Emphysema, unspecified: Secondary | ICD-10-CM

## 2022-09-16 DIAGNOSIS — I10 Essential (primary) hypertension: Secondary | ICD-10-CM | POA: Diagnosis not present

## 2022-09-16 DIAGNOSIS — Z23 Encounter for immunization: Secondary | ICD-10-CM | POA: Diagnosis not present

## 2022-09-16 DIAGNOSIS — E291 Testicular hypofunction: Secondary | ICD-10-CM

## 2022-09-16 LAB — LIPID PANEL
Cholesterol: 144 mg/dL (ref 0–200)
HDL: 44.2 mg/dL (ref >39.00)
LDL Cholesterol: 65 mg/dL (ref 0–99)
NonHDL: 100.2
Total CHOL/HDL Ratio: 3
Triglycerides: 178 mg/dL — ABNORMAL HIGH (ref 0.0–149.0)
VLDL: 35.6 mg/dL (ref 0.0–40.0)

## 2022-09-16 LAB — CBC WITH DIFFERENTIAL/PLATELET
Basophils Absolute: 0.1 10*3/uL (ref 0.0–0.1)
Basophils Relative: 1.7 % (ref 0.0–3.0)
Eosinophils Absolute: 0.4 10*3/uL (ref 0.0–0.7)
Eosinophils Relative: 5.6 % — ABNORMAL HIGH (ref 0.0–5.0)
HCT: 41.9 % (ref 39.0–52.0)
Hemoglobin: 14.7 g/dL (ref 13.0–17.0)
Lymphocytes Relative: 29.1 % (ref 12.0–46.0)
Lymphs Abs: 1.9 10*3/uL (ref 0.7–4.0)
MCHC: 35 g/dL (ref 30.0–36.0)
MCV: 94.1 fl (ref 78.0–100.0)
Monocytes Absolute: 0.7 10*3/uL (ref 0.1–1.0)
Monocytes Relative: 10.5 % (ref 3.0–12.0)
Neutro Abs: 3.4 10*3/uL (ref 1.4–7.7)
Neutrophils Relative %: 53.1 % (ref 43.0–77.0)
Platelets: 376 10*3/uL (ref 150.0–400.0)
RBC: 4.45 Mil/uL (ref 4.22–5.81)
RDW: 13.2 % (ref 11.5–15.5)
WBC: 6.4 10*3/uL (ref 4.0–10.5)

## 2022-09-16 LAB — HEMOGLOBIN A1C: Hgb A1c MFr Bld: 7.9 % — ABNORMAL HIGH (ref 4.6–6.5)

## 2022-09-16 LAB — TESTOSTERONE: Testosterone: 225.95 ng/dL — ABNORMAL LOW (ref 300.00–890.00)

## 2022-09-16 LAB — COMPREHENSIVE METABOLIC PANEL
ALT: 35 U/L (ref 0–53)
AST: 20 U/L (ref 0–37)
Albumin: 4.7 g/dL (ref 3.5–5.2)
Alkaline Phosphatase: 70 U/L (ref 39–117)
BUN: 15 mg/dL (ref 6–23)
CO2: 29 mEq/L (ref 19–32)
Calcium: 10 mg/dL (ref 8.4–10.5)
Chloride: 92 mEq/L — ABNORMAL LOW (ref 96–112)
Creatinine, Ser: 0.85 mg/dL (ref 0.40–1.50)
GFR: 91.33 mL/min (ref >60.00)
Glucose, Bld: 166 mg/dL — ABNORMAL HIGH (ref 70–99)
Potassium: 4.1 mEq/L (ref 3.5–5.1)
Sodium: 132 mEq/L — ABNORMAL LOW (ref 135–145)
Total Bilirubin: 0.5 mg/dL (ref 0.2–1.2)
Total Protein: 7.7 g/dL (ref 6.0–8.3)

## 2022-09-16 LAB — TSH: TSH: 1.39 u[IU]/mL (ref 0.35–5.50)

## 2022-09-16 MED ORDER — TIRZEPATIDE 7.5 MG/0.5ML ~~LOC~~ SOAJ
7.5000 mg | SUBCUTANEOUS | 0 refills | Status: DC
  Filled 2022-09-16 – 2022-11-11 (×2): qty 2, 28d supply, fill #0

## 2022-09-16 MED ORDER — TIRZEPATIDE 5 MG/0.5ML ~~LOC~~ SOAJ
5.0000 mg | SUBCUTANEOUS | 0 refills | Status: DC
  Filled 2022-09-16 – 2022-10-14 (×2): qty 2, 28d supply, fill #0

## 2022-09-16 MED ORDER — TIRZEPATIDE 2.5 MG/0.5ML ~~LOC~~ SOAJ
2.5000 mg | SUBCUTANEOUS | 0 refills | Status: DC
  Filled 2022-09-16: qty 2, 28d supply, fill #0

## 2022-09-16 MED ORDER — TIRZEPATIDE 10 MG/0.5ML ~~LOC~~ SOAJ
10.0000 mg | SUBCUTANEOUS | 0 refills | Status: DC
  Filled 2022-09-16 – 2022-12-10 (×2): qty 2, 28d supply, fill #0

## 2022-09-16 NOTE — Progress Notes (Signed)
Subjective:   By signing my name below, I, Kellie Simmering, attest that this documentation has been prepared under the direction and in the presence of Mosie Lukes, MD., 09/16/2022.    Patient ID: Phillip Doyle, male    DOB: 1956-05-04, 67 y.o.   MRN: [id number]  Chief Complaint  Patient presents with   Follow-up    Follow up   HPI Patient is in today for an office visit. He denies CP/palpitations/SOB/HA/congestion/ fever/chills/GI or GU symptoms.  Diabetes Mellitus Type 2 Patient has been unable to take Ozempic due to supply chain issues and is seeking an alternative medication that he will be able to take consistently. He continues to take Metformin 1000 mg bid but reports that his blood sugar has recently been within the range of 165-180 ng/mL. Lab Results  Component Value Date   HGBA1C 7.9 (H) 09/16/2022   Testosterone Patient's insurance will no longer cover his usage of testosterone. His last testosterone level was low and this will be rechecked today. He complains that he has been feeling less energetic. Lab Results  Component Value Date   TESTOSTERONE 225.95 (L) 09/16/2022   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/01/2016   Low testosterone level in male 08/01/2016   LUMBAR STRAIN 10/17/2010   Mixed hyperlipidemia 95/04/3266   NISSEN FUNDOPLICATION, HX OF 09/11/5807   OA (osteoarthritis) of knee 02/17/2012   Obesity    PEPTIC ULCER DISEASE 02/15/2007   Personal history of colonic polyps  01/17/2013   colonoscoppy 2013, benign polyp advised repeat in 10 years   Preventative health care 07/19/2012   Restless sleeper 10/30/2016   TOBACCO ABUSE 07/04/2010   Ulcer 1982   peptic ulcer disease    Past Surgical History:  Procedure Laterality Date   COLONOSCOPY  03/2012   Several hyperplastic polyps   fundiplication for [facility] and reflux  1987   negative stress test  2008   ROTATOR CUFF REPAIR  2000   VASECTOMY  1982    Family History  Problem Relation Age of Onset   Cancer Mother        uterine    Asthma Mother    Alcohol abuse Father    Cancer Sister        Brain tumor   Hypertension Brother    Diabetes Brother    Heart disease Brother        aortic valve replaced, CAD   Cancer Brother        prostate   Stroke Maternal Grandmother        possibly   Hypertension Maternal Grandfather    Colon cancer Neg Hx     Social History   Socioeconomic History   Marital status: Married    Spouse name: [name]   Number of children: Not on file   Years of education: Not on file   Highest education level: Not on file  Occupational History    Employer:  [employer]  Tobacco Use   Smoking status: Former    Packs/day: 2.00    Years: 40.00    Total pack years: 80.00    Types: Cigarettes    Quit date: 06/28/2014    Years since quitting: 8.2   Smokeless tobacco: Former    Quit date: 03/18/2012   Tobacco comments:    quit 06/18/10- started back   Vaping Use   Vaping Use: Not on file  Substance and Sexual Activity   Alcohol use: Yes    Alcohol/week: 3.0 - 4.0 standard drinks of alcohol    Types: 3 - 4 Cans of beer per week    Comment: 3-4 every other day   Drug use: No   Sexual activity: Yes    Partners: Female  Other Topics Concern   Not on file  Social History Narrative   Not on file   Social Determinants of Health   Financial Resource Strain: Not on file  Food Insecurity: Not on file  Transportation Needs: Not on file  Physical Activity: Not on file  Stress:  Not on file  Social Connections: Not on file  Intimate Partner Violence: Not on file    Outpatient Medications Prior to Visit  Medication Sig Dispense Refill   acetaminophen (TYLENOL) 650 MG CR tablet Take 1,300 mg by mouth 2 (two) times daily as needed for pain.     albuterol (VENTOLIN HFA) 108 (90 Base) MCG/ACT inhaler TAKE 2 PUFFS INTO LUNGS EVERY 6 HOURS AS NEEDED FOR WHEEZE OR SHORTNESS OF BREATH 18 each 12   FREESTYLE LITE test strip CHECK BLOOD SUGAR TWICE DAILY. DX: E11.9**VERIFY PATIENTS METER** 100 strip 0   glimepiride (AMARYL) 1 MG tablet TAKE 1 TABLET BY MOUTH EVERY DAY WITH BREAKFAST 90 tablet 1   Lancets (FREESTYLE) lancets Check blood sugar twice daily.  DX E11.9 100 each 6   lisinopril-hydrochlorothiazide (ZESTORETIC) 20-12.5 MG tablet TAKE 1 TABLET BY MOUTH TWICE A DAY 180 tablet 1   metFORMIN (GLUCOPHAGE) 1000 MG tablet Take 1 tablet (1,000 mg total) by mouth 2 (two) times daily with a meal. 180 tablet 0   metoprolol (TOPROL-XL) 200 MG 24 hr tablet TAKE 1 TABLET BY MOUTH EVERY DAY 90 tablet 1   Multiple Vitamins-Minerals (PA MENS 50 PLUS VITAPAK PO) Take 1 capsule by mouth daily.     Oral Electrolytes (H-E-B ORAL ELECTROLYTE PO) Take by mouth.     simvastatin (ZOCOR) 20 MG tablet Take 1 tablet (20 mg total) by mouth daily at 6 PM. 90 tablet 1   testosterone (TESTIM) 50 MG/5GM (1%) GEL Place 5 g onto the skin daily. 150 g 2   No facility-administered medications prior to visit.    No Known Allergies  Review of Systems  Constitutional:  Negative for chills and fever.  HENT:  Negative for congestion.   Respiratory:  Negative for shortness of breath.   Cardiovascular:  Negative for chest pain and palpitations.  Gastrointestinal:  Negative for abdominal pain, blood in stool, constipation, diarrhea, nausea and vomiting.  Genitourinary:  Negative for dysuria, frequency, hematuria and urgency.  Skin:           Neurological:  Negative for headaches.       Objective:     Physical Exam Constitutional:      General: He is not in acute distress.    Appearance: Normal appearance. He is normal weight. He is not ill-appearing.  HENT:     Head: Normocephalic and atraumatic.  Right Ear: External ear normal.     Left Ear: External ear normal.     Nose: Nose normal.     Mouth/Throat:     Mouth: Mucous membranes are moist.     Pharynx: Oropharynx is clear.  Eyes:     General:        Right eye: No discharge.        Left eye: No discharge.     Extraocular Movements: Extraocular movements intact.     Conjunctiva/sclera: Conjunctivae normal.     Pupils: Pupils are equal, round, and reactive to light.  Cardiovascular:     Rate and Rhythm: Normal rate and regular rhythm.     Pulses: Normal pulses.     Heart sounds: Normal heart sounds. No murmur heard.    No gallop.  Pulmonary:     Effort: Pulmonary effort is normal. No respiratory distress.     Breath sounds: Normal breath sounds. No wheezing or rales.  Abdominal:     General: Bowel sounds are normal.     Palpations: Abdomen is soft.     Tenderness: There is no abdominal tenderness. There is no guarding.  Musculoskeletal:        General: Normal range of motion.     Cervical back: Normal range of motion.     Right lower leg: No edema.     Left lower leg: No edema.  Skin:    General: Skin is warm and dry.  Neurological:     Mental Status: He is alert and oriented to person, place, and time.  Psychiatric:        Mood and Affect: Mood normal.        Behavior: Behavior normal.        Judgment: Judgment normal.     BP 130/74 (BP Location: Left Arm, Patient Position: Sitting, Cuff Size: Large)   Pulse 89   Temp 98 F (36.7 C)   Resp 16   Ht '5\' 9"'$  (1.753 m)   Wt 249 lb 3.2 oz (113 kg)   SpO2 97%   BMI 36.80 kg/m  Wt Readings from Last 3 Encounters:  09/16/22 249 lb 3.2 oz (113 kg)  02/25/22 232 lb 6.4 oz (105.4 kg)  11/19/21 241 lb (109.3 kg)    Diabetic Foot Exam - Simple   No data  filed    Lab Results  Component Value Date   WBC 6.4 09/16/2022   HGB 14.7 09/16/2022   HCT 41.9 09/16/2022   PLT 376.0 09/16/2022   GLUCOSE 166 (H) 09/16/2022   CHOL 144 09/16/2022   TRIG 178.0 (H) 09/16/2022   HDL 44.20 09/16/2022   LDLDIRECT 65.0 11/19/2021   LDLCALC 65 09/16/2022   ALT 35 09/16/2022   AST 20 09/16/2022   NA 132 (L) 09/16/2022   K 4.1 09/16/2022   CL 92 (L) 09/16/2022   CREATININE 0.85 09/16/2022   BUN 15 09/16/2022   CO2 29 09/16/2022   TSH 1.39 09/16/2022   PSA 0.25 06/25/2020   HGBA1C 7.9 (H) 09/16/2022   MICROALBUR 9.7 (H) 11/19/2021    Lab Results  Component Value Date   TSH 1.39 09/16/2022   Lab Results  Component Value Date   WBC 6.4 09/16/2022   HGB 14.7 09/16/2022   HCT 41.9 09/16/2022   MCV 94.1 09/16/2022   PLT 376.0 09/16/2022   Lab Results  Component Value Date   NA 132 (L) 09/16/2022   K 4.1 09/16/2022   CO2 29 09/16/2022  GLUCOSE 166 (H) 09/16/2022   BUN 15 09/16/2022   CREATININE 0.85 09/16/2022   BILITOT 0.5 09/16/2022   ALKPHOS 70 09/16/2022   AST 20 09/16/2022   ALT 35 09/16/2022   PROT 7.7 09/16/2022   ALBUMIN 4.7 09/16/2022   CALCIUM 10.0 09/16/2022   ANIONGAP 6 02/10/2018   GFR 91.33 09/16/2022   Lab Results  Component Value Date   CHOL 144 09/16/2022   Lab Results  Component Value Date   HDL 44.20 09/16/2022   Lab Results  Component Value Date   LDLCALC 65 09/16/2022   Lab Results  Component Value Date   TRIG 178.0 (H) 09/16/2022   Lab Results  Component Value Date   CHOLHDL 3 09/16/2022   Lab Results  Component Value Date   HGBA1C 7.9 (H) 09/16/2022      Assessment & Plan:  Diabetes Mellitus Type 2: Continues to take Metformin 1000 mg bid. Mounjaro 2.5 mg/0.5 mL has been prescribed today and patient will titrate up to [facility] 10 mg/0.5 mL across the next 3 months.   Immunizations: Reviewed patient's immunization history. Encouraged RSV and Shingles immunizations. Prevnar 20  administered today in office.  Labs: Routine blood work today also measured Testosterone levels. Problem List Items Addressed This Visit     Diabetes mellitus type 2 in obese ([place])    hgba1c acceptable, minimize simple carbs. Increase exercise as tolerated. Continue current meds except he was out of his GLP 1 a few weeks ago. Agrees to try Mounjaro 2.5 and titrate up monthly. Given Prevnar 20 today      Relevant Medications   tirzepatide [facility]) 2.5 MG/0.5ML Pen   tirzepatide [facility]) 5 MG/0.5ML Pen (Start on 10/15/2022)   tirzepatide [facility]) 7.5 MG/0.5ML Pen (Start on 11/13/2022)   tirzepatide [facility]) 10 MG/0.5ML Pen (Start on 12/12/2022)   Other Relevant Orders   Hemoglobin A1c (Completed)   HTN (hypertension) - Primary   Relevant Orders   CBC with Differential/Platelet (Completed)   Comprehensive metabolic panel (Completed)   TSH (Completed)   Low testosterone in male   Relevant Orders   Testosterone (Completed)   Mixed hyperlipidemia    Encourage heart healthy diet such as MIND or DASH diet, increase exercise, avoid trans fats, simple carbohydrates and processed foods, consider a krill or fish or flaxseed oil cap daily. Tolerating Simvastatin      Relevant Orders   Lipid panel (Completed)   Obesity (Chronic)    Encouraged DASH or MIND diet, decrease po intake and increase exercise as tolerated. Needs 7-8 hours of sleep nightly. Avoid trans fats, eat small, frequent meals every 4-5 hours with lean proteins, complex carbs and healthy fats. Minimize simple carbs, high fat foods and processed foods       Relevant Medications   tirzepatide (MOUNJARO) 2.5 MG/0.5ML Pen   tirzepatide [facility]) 5 MG/0.5ML Pen (Start on 10/15/2022)   tirzepatide [facility]) 7.5 MG/0.5ML Pen (Start on 11/13/2022)   tirzepatide [facility]) 10 MG/0.5ML Pen (Start on 12/12/2022)   Pulmonary emphysema (HCC)    No recent exacerbation, no change in therapy      Testosterone deficiency in male     Supplement and monitor if patient chooses to may require referral to urology      Other Visit Diagnoses     Need for pneumococcal 20-valent conjugate vaccination       Relevant Orders   Pneumococcal conjugate vaccine 20-valent (Prevnar 20) (Completed)      Meds ordered this encounter  Medications   tirzepatide (  MOUNJARO) 2.5 MG/0.5ML Pen    Sig: Inject 2.5 mg into the skin once a week.    Dispense:  2 mL    Refill:  0   tirzepatide (MOUNJARO) 5 MG/0.5ML Pen    Sig: Inject 5 mg into the skin once a week.    Dispense:  6 mL    Refill:  0   tirzepatide (MOUNJARO) 7.5 MG/0.5ML Pen    Sig: Inject 7.5 mg into the skin once a week.    Dispense:  6 mL    Refill:  0   tirzepatide (MOUNJARO) 10 MG/0.5ML Pen    Sig: Inject 10 mg into the skin once a week.    Dispense:  6 mL    Refill:  0   I, Penni Homans, MD, personally preformed the services described in this documentation.  All medical record entries made by the scribe were at my direction and in my presence.  I have reviewed the chart and discharge instructions (if applicable) and agree that the record reflects my personal performance and is accurate and complete. 09/16/2022  I,Mohammed Iqbal,acting as a scribe for Penni Homans, MD.,have documented all relevant documentation on the behalf of Penni Homans, MD,as directed by  Penni Homans, MD while in the presence of Penni Homans, MD.  Penni Homans, MD

## 2022-09-16 NOTE — Patient Instructions (Addendum)
RSV, Respiratory Syncitial Virus, Arexvy vaccine at pharmacy  Shingrix is the new shingles shot, 2 shots over 2-6 months, confirm coverage with insurance and document, then can return here for shots with nurse appt or at pharmacy    Carbohydrate Counting for Diabetes Mellitus, Adult Carbohydrate counting is a method of keeping track of how many carbohydrates you eat. Eating carbohydrates increases the amount of sugar (glucose) in the blood. Counting how many carbohydrates you eat improves how well you manage your blood glucose. This, in turn, helps you manage your diabetes. Carbohydrates are measured in grams (g) per serving. It is important to know how many carbohydrates (in grams or by serving size) you can have in each meal. This is different for every person. A dietitian can help you make a meal plan and calculate how many carbohydrates you should have at each meal and snack. What foods contain carbohydrates? Carbohydrates are found in the following foods: Grains, such as breads and cereals. Dried beans and soy products. Starchy vegetables, such as potatoes, peas, and corn. Fruit and fruit juices. Milk and yogurt. Sweets and snack foods, such as cake, cookies, candy, chips, and soft drinks. How do I count carbohydrates in foods? There are two ways to count carbohydrates in food. You can read food labels or learn standard serving sizes of foods. You can use either of these methods or a combination of both. Using the Nutrition Facts label The Nutrition Facts list is included on the labels of almost all packaged foods and beverages in the [place]. It includes: The serving size. Information about nutrients in each serving, including the grams of carbohydrate per serving. To use the Nutrition Facts, decide how many servings you will have. Then, multiply the number of servings by the number of carbohydrates per serving. The resulting number is the total grams of carbohydrates that you will  be having. Learning the standard serving sizes of foods When you eat carbohydrate foods that are not packaged or do not include Nutrition Facts on the label, you need to measure the servings in order to count the grams of carbohydrates. Measure the foods that you will eat with a food scale or measuring cup, if needed. Decide how many standard-size servings you will eat. Multiply the number of servings by 15. For foods that contain carbohydrates, one serving equals 15 g of carbohydrates. For example, if you eat 2 cups or 10 oz (300 g) of strawberries, you will have eaten 2 servings and 30 g of carbohydrates (2 servings x 15 g = 30 g). For foods that have more than one food mixed, such as soups and casseroles, you must count the carbohydrates in each food that is included. The following list contains standard serving sizes of common carbohydrate-rich foods. Each of these servings has about 15 g of carbohydrates: 1 slice of bread. 1 six-inch (15 cm) tortilla. ? cup or 2 oz (53 g) cooked rice or pasta.  cup or 3 oz (85 g) cooked or canned, drained and rinsed beans or lentils.  cup or 3 oz (85 g) starchy vegetable, such as peas, corn, or squash.  cup or 4 oz (120 g) hot cereal.  cup or 3 oz (85 g) boiled or mashed potatoes, or  or 3 oz (85 g) of a large baked potato.  cup or 4 fl oz (118 mL) fruit juice. 1 cup or 8 fl oz (237 mL) milk. 1 small or 4 oz (106 g) apple.  or 2 oz (63 g)  of a medium banana. 1 cup or 5 oz (150 g) strawberries. 3 cups or 1 oz (28.3 g) popped popcorn. What is an example of carbohydrate counting? To calculate the grams of carbohydrates in this sample meal, follow the steps shown below. Sample meal 3 oz (85 g) chicken breast. ? cup or 4 oz (106 g) brown rice.  cup or 3 oz (85 g) corn. 1 cup or 8 fl oz (237 mL) milk. 1 cup or 5 oz (150 g) strawberries with sugar-free whipped topping. Carbohydrate calculation Identify the foods that contain  carbohydrates: Rice. Corn. Milk. Strawberries. Calculate how many servings you have of each food: 2 servings rice. 1 serving corn. 1 serving milk. 1 serving strawberries. Multiply each number of servings by 15 g: 2 servings rice x 15 g = 30 g. 1 serving corn x 15 g = 15 g. 1 serving milk x 15 g = 15 g. 1 serving strawberries x 15 g = 15 g. Add together all of the amounts to find the total grams of carbohydrates eaten: 30 g + 15 g + 15 g + 15 g = 75 g of carbohydrates total. What are tips for following this plan? Shopping Develop a meal plan and then make a shopping list. Buy fresh and frozen vegetables, fresh and frozen fruit, dairy, eggs, beans, lentils, and whole grains. Look at food labels. Choose foods that have more fiber and less sugar. Avoid processed foods and foods with added sugars. Meal planning Aim to have the same number of grams of carbohydrates at each meal and for each snack time. Plan to have regular, balanced meals and snacks. Where to find more information American Diabetes Association: diabetes.org Centers for Disease Control and Prevention: [employer].[place] Academy of Nutrition and Dietetics: eatright.org Association of Diabetes Care & Education Specialists: diabeteseducator.org Summary Carbohydrate counting is a method of keeping track of how many carbohydrates you eat. Eating carbohydrates increases the amount of sugar (glucose) in your blood. Counting how many carbohydrates you eat improves how well you manage your blood glucose. This helps you manage your diabetes. A dietitian can help you make a meal plan and calculate how many carbohydrates you should have at each meal and snack. This information is not intended to replace advice given to you by your health care provider. Make sure you discuss any questions you have with your health care provider. Document Revised: 03/07/2020 Document Reviewed: 03/07/2020 Elsevier Patient Education  [place].

## 2022-09-17 ENCOUNTER — Other Ambulatory Visit: Payer: Self-pay

## 2022-09-21 DIAGNOSIS — E291 Testicular hypofunction: Secondary | ICD-10-CM | POA: Insufficient documentation

## 2022-09-21 NOTE — Assessment & Plan Note (Signed)
No recent exacerbation, no change in therapy

## 2022-09-21 NOTE — Assessment & Plan Note (Addendum)
Supplement and monitor if patient chooses to may require referral to urology

## 2022-09-21 NOTE — Assessment & Plan Note (Signed)
Encourage heart healthy diet such as MIND or DASH diet, increase exercise, avoid trans fats, simple carbohydrates and processed foods, consider a krill or fish or flaxseed oil cap daily. Tolerating Simvastatin

## 2022-09-21 NOTE — Assessment & Plan Note (Addendum)
hgba1c acceptable, minimize simple carbs. Increase exercise as tolerated. Continue current meds except he was out of his GLP 1 a few weeks ago. Agrees to try Mounjaro 2.5 and titrate up monthly. Given Prevnar 20 today

## 2022-09-21 NOTE — Assessment & Plan Note (Signed)
Encouraged DASH or MIND diet, decrease po intake and increase exercise as tolerated. Needs 7-8 hours of sleep nightly. Avoid trans fats, eat small, frequent meals every 4-5 hours with lean proteins, complex carbs and healthy fats. Minimize simple carbs, high fat foods and processed foods 

## 2022-09-27 ENCOUNTER — Other Ambulatory Visit: Payer: Self-pay | Admitting: Family Medicine

## 2022-10-14 ENCOUNTER — Other Ambulatory Visit (HOSPITAL_COMMUNITY): Payer: Self-pay

## 2022-10-19 ENCOUNTER — Encounter: Payer: Self-pay | Admitting: Family Medicine

## 2022-11-11 ENCOUNTER — Other Ambulatory Visit (HOSPITAL_COMMUNITY): Payer: Self-pay

## 2022-11-22 ENCOUNTER — Other Ambulatory Visit: Payer: Self-pay | Admitting: Family Medicine

## 2022-12-10 ENCOUNTER — Other Ambulatory Visit (HOSPITAL_COMMUNITY): Payer: Self-pay

## 2022-12-20 NOTE — Assessment & Plan Note (Signed)
No recent exacerbation 

## 2022-12-20 NOTE — Assessment & Plan Note (Signed)
Supplement and monitor 

## 2022-12-20 NOTE — Assessment & Plan Note (Signed)
Well controlled, no changes to meds. Encouraged heart healthy diet such as the DASH diet and exercise as tolerated. Improved on recheck, reports good control at home. Check weekly at rest

## 2022-12-20 NOTE — Assessment & Plan Note (Signed)
hgba1c acceptable, minimize simple carbs. Increase exercise as tolerated. Continue current meds on Mounjaro 10 mg weekly

## 2022-12-20 NOTE — Assessment & Plan Note (Signed)
Encourage heart healthy diet such as MIND or DASH diet, increase exercise, avoid trans fats, simple carbohydrates and processed foods, consider a krill or fish or flaxseed oil cap daily. Tolerating Simvastatin 

## 2022-12-22 ENCOUNTER — Encounter: Payer: Self-pay | Admitting: Family Medicine

## 2022-12-22 ENCOUNTER — Ambulatory Visit (INDEPENDENT_AMBULATORY_CARE_PROVIDER_SITE_OTHER): Payer: BC Managed Care – PPO | Admitting: Family Medicine

## 2022-12-22 ENCOUNTER — Other Ambulatory Visit (HOSPITAL_BASED_OUTPATIENT_CLINIC_OR_DEPARTMENT_OTHER): Payer: Self-pay

## 2022-12-22 VITALS — BP 132/80 | HR 95 | Temp 98.0°F | Resp 16 | Ht 69.0 in | Wt 238.6 lb

## 2022-12-22 DIAGNOSIS — Z6835 Body mass index (BMI) 35.0-35.9, adult: Secondary | ICD-10-CM

## 2022-12-22 DIAGNOSIS — E669 Obesity, unspecified: Secondary | ICD-10-CM

## 2022-12-22 DIAGNOSIS — E559 Vitamin D deficiency, unspecified: Secondary | ICD-10-CM

## 2022-12-22 DIAGNOSIS — E1169 Type 2 diabetes mellitus with other specified complication: Secondary | ICD-10-CM | POA: Diagnosis not present

## 2022-12-22 DIAGNOSIS — J449 Chronic obstructive pulmonary disease, unspecified: Secondary | ICD-10-CM

## 2022-12-22 DIAGNOSIS — E291 Testicular hypofunction: Secondary | ICD-10-CM | POA: Diagnosis not present

## 2022-12-22 DIAGNOSIS — E6609 Other obesity due to excess calories: Secondary | ICD-10-CM

## 2022-12-22 DIAGNOSIS — I10 Essential (primary) hypertension: Secondary | ICD-10-CM | POA: Diagnosis not present

## 2022-12-22 DIAGNOSIS — E782 Mixed hyperlipidemia: Secondary | ICD-10-CM | POA: Diagnosis not present

## 2022-12-22 LAB — CBC WITH DIFFERENTIAL/PLATELET
Basophils Absolute: 0.1 10*3/uL (ref 0.0–0.1)
Basophils Relative: 1.3 % (ref 0.0–3.0)
Eosinophils Absolute: 0.2 10*3/uL (ref 0.0–0.7)
Eosinophils Relative: 2.2 % (ref 0.0–5.0)
HCT: 42.5 % (ref 39.0–52.0)
Hemoglobin: 15.1 g/dL (ref 13.0–17.0)
Lymphocytes Relative: 18.5 % (ref 12.0–46.0)
Lymphs Abs: 1.4 10*3/uL (ref 0.7–4.0)
MCHC: 35.4 g/dL (ref 30.0–36.0)
MCV: 89.3 fl (ref 78.0–100.0)
Monocytes Absolute: 0.8 10*3/uL (ref 0.1–1.0)
Monocytes Relative: 10.6 % (ref 3.0–12.0)
Neutro Abs: 5.2 10*3/uL (ref 1.4–7.7)
Neutrophils Relative %: 67.4 % (ref 43.0–77.0)
Platelets: 400 10*3/uL (ref 150.0–400.0)
RBC: 4.76 Mil/uL (ref 4.22–5.81)
RDW: 13.1 % (ref 11.5–15.5)
WBC: 7.7 10*3/uL (ref 4.0–10.5)

## 2022-12-22 LAB — VITAMIN D 25 HYDROXY (VIT D DEFICIENCY, FRACTURES): VITD: 27.14 ng/mL — ABNORMAL LOW (ref 30.00–100.00)

## 2022-12-22 LAB — TSH: TSH: 1.35 u[IU]/mL (ref 0.35–5.50)

## 2022-12-22 LAB — COMPREHENSIVE METABOLIC PANEL
ALT: 32 U/L (ref 0–53)
AST: 16 U/L (ref 0–37)
Albumin: 4.8 g/dL (ref 3.5–5.2)
Alkaline Phosphatase: 67 U/L (ref 39–117)
BUN: 9 mg/dL (ref 6–23)
CO2: 26 mEq/L (ref 19–32)
Calcium: 10 mg/dL (ref 8.4–10.5)
Chloride: 85 mEq/L — ABNORMAL LOW (ref 96–112)
Creatinine, Ser: 0.9 mg/dL (ref 0.40–1.50)
GFR: 89.6 mL/min (ref >60.00)
Glucose, Bld: 106 mg/dL — ABNORMAL HIGH (ref 70–99)
Potassium: 4.1 mEq/L (ref 3.5–5.1)
Sodium: 124 mEq/L — ABNORMAL LOW (ref 135–145)
Total Bilirubin: 0.6 mg/dL (ref 0.2–1.2)
Total Protein: 7.7 g/dL (ref 6.0–8.3)

## 2022-12-22 LAB — LIPID PANEL
Cholesterol: 93 mg/dL (ref 0–200)
HDL: 36 mg/dL — ABNORMAL LOW (ref >39.00)
LDL Cholesterol: 40 mg/dL (ref 0–99)
NonHDL: 57.41
Total CHOL/HDL Ratio: 3
Triglycerides: 87 mg/dL (ref 0.0–149.0)
VLDL: 17.4 mg/dL (ref 0.0–40.0)

## 2022-12-22 LAB — TESTOSTERONE: Testosterone: 211.4 ng/dL — ABNORMAL LOW (ref 300.00–890.00)

## 2022-12-22 LAB — HEMOGLOBIN A1C: Hgb A1c MFr Bld: 7.1 % — ABNORMAL HIGH (ref 4.6–6.5)

## 2022-12-22 MED ORDER — METOPROLOL SUCCINATE ER 200 MG PO TB24
200.0000 mg | ORAL_TABLET | Freq: Every day | ORAL | 1 refills | Status: DC
  Filled 2022-12-22: qty 90, 90d supply, fill #0

## 2022-12-22 MED ORDER — SIMVASTATIN 20 MG PO TABS
20.0000 mg | ORAL_TABLET | Freq: Every day | ORAL | 1 refills | Status: DC
  Filled 2022-12-22: qty 90, 90d supply, fill #0

## 2022-12-22 NOTE — Patient Instructions (Signed)
Hypertension, Adult High blood pressure (hypertension) is when the force of blood pumping through the arteries is too strong. The arteries are the blood vessels that carry blood from the heart throughout the body. Hypertension forces the heart to work harder to pump blood and may cause arteries to become narrow or stiff. Untreated or uncontrolled hypertension can lead to a heart attack, heart failure, a stroke, kidney disease, and other problems. A blood pressure reading consists of a higher number over a lower number. Ideally, your blood pressure should be below 120/80. The first ("top") number is called the systolic pressure. It is a measure of the pressure in your arteries as your heart beats. The second ("bottom") number is called the diastolic pressure. It is a measure of the pressure in your arteries as the heart relaxes. What are the causes? The exact cause of this condition is not known. There are some conditions that result in high blood pressure. What increases the risk? Certain factors may make you more likely to develop high blood pressure. Some of these risk factors are under your control, including: Smoking. Not getting enough exercise or physical activity. Being overweight. Having too much fat, sugar, calories, or salt (sodium) in your diet. Drinking too much alcohol. Other risk factors include: Having a personal history of heart disease, diabetes, high cholesterol, or kidney disease. Stress. Having a family history of high blood pressure and high cholesterol. Having obstructive sleep apnea. Age. The risk increases with age. What are the signs or symptoms? High blood pressure may not cause symptoms. Very high blood pressure (hypertensive crisis) may cause: Headache. Fast or irregular heartbeats (palpitations). Shortness of breath. Nosebleed. Nausea and vomiting. Vision changes. Severe chest pain, dizziness, and seizures. How is this diagnosed? This condition is diagnosed by  measuring your blood pressure while you are seated, with your arm resting on a flat surface, your legs uncrossed, and your feet flat on the floor. The cuff of the blood pressure monitor will be placed directly against the skin of your upper arm at the level of your heart. Blood pressure should be measured at least twice using the same arm. Certain conditions can cause a difference in blood pressure between your right and left arms. If you have a high blood pressure reading during one visit or you have normal blood pressure with other risk factors, you may be asked to: Return on a different day to have your blood pressure checked again. Monitor your blood pressure at home for 1 week or longer. If you are diagnosed with hypertension, you may have other blood or imaging tests to help your health care provider understand your overall risk for other conditions. How is this treated? This condition is treated by making healthy lifestyle changes, such as eating healthy foods, exercising more, and reducing your alcohol intake. You may be referred for counseling on a healthy diet and physical activity. Your health care provider may prescribe medicine if lifestyle changes are not enough to get your blood pressure under control and if: Your systolic blood pressure is above 130. Your diastolic blood pressure is above 80. Your personal target blood pressure may vary depending on your medical conditions, your age, and other factors. Follow these instructions at home: Eating and drinking  Eat a diet that is high in fiber and potassium, and low in sodium, added sugar, and fat. An example of this eating plan is called the DASH diet. DASH stands for Dietary Approaches to Stop Hypertension. To eat this way: Eat   plenty of fresh fruits and vegetables. Try to fill one half of your plate at each meal with fruits and vegetables. Eat whole grains, such as whole-wheat pasta, brown rice, or whole-grain bread. Fill about one  fourth of your plate with whole grains. Eat or drink low-fat dairy products, such as skim milk or low-fat yogurt. Avoid fatty cuts of meat, processed or cured meats, and poultry with skin. Fill about one fourth of your plate with lean proteins, such as fish, chicken without skin, beans, eggs, or tofu. Avoid pre-made and processed foods. These tend to be higher in sodium, added sugar, and fat. Reduce your daily sodium intake. Many people with hypertension should eat less than 1,500 mg of sodium a day. Do not drink alcohol if: Your health care provider tells you not to drink. You are pregnant, may be pregnant, or are planning to become pregnant. If you drink alcohol: Limit how much you have to: 0-1 drink a day for women. 0-2 drinks a day for men. Know how much alcohol is in your drink. In the [place] one drink equals one 12 oz bottle of beer (355 mL), one 5 oz glass of wine (148 mL), or one 1 oz glass of hard liquor (44 mL). Lifestyle  Work with your health care provider to maintain a healthy body weight or to lose weight. Ask what an ideal weight is for you. Get at least 30 minutes of exercise that causes your heart to beat faster (aerobic exercise) most days of the week. Activities may include walking, swimming, or biking. Include exercise to strengthen your muscles (resistance exercise), such as Pilates or lifting weights, as part of your weekly exercise routine. Try to do these types of exercises for 30 minutes at least 3 days a week. Do not use any products that contain nicotine or tobacco. These products include cigarettes, chewing tobacco, and vaping devices, such as e-cigarettes. If you need help quitting, ask your health care provider. Monitor your blood pressure at home as told by your health care provider. Keep all follow-up visits. This is important. Medicines Take over-the-counter and prescription medicines only as told by your health care provider. Follow directions carefully. Blood  pressure medicines must be taken as prescribed. Do not skip doses of blood pressure medicine. Doing this puts you at risk for problems and can make the medicine less effective. Ask your health care provider about side effects or reactions to medicines that you should watch for. Contact a health care provider if you: Think you are having a reaction to a medicine you are taking. Have headaches that keep coming back (recurring). Feel dizzy. Have swelling in your ankles. Have trouble with your vision. Get help right away if you: Develop a severe headache or confusion. Have unusual weakness or numbness. Feel faint. Have severe pain in your chest or abdomen. Vomit repeatedly. Have trouble breathing. These symptoms may be an emergency. Get help right away. Call 911. Do not wait to see if the symptoms will go away. Do not drive yourself to the hospital. Summary Hypertension is when the force of blood pumping through your arteries is too strong. If this condition is not controlled, it may put you at risk for serious complications. Your personal target blood pressure may vary depending on your medical conditions, your age, and other factors. For most people, a normal blood pressure is less than 120/80. Hypertension is treated with lifestyle changes, medicines, or a combination of both. Lifestyle changes include losing weight, eating a healthy,   low-sodium diet, exercising more, and limiting alcohol. This information is not intended to replace advice given to you by your health care provider. Make sure you discuss any questions you have with your health care provider. Document Revised: 06/11/2021 Document Reviewed: 06/11/2021 Elsevier Patient Education  2023 [employer] Inc.  

## 2022-12-22 NOTE — Assessment & Plan Note (Signed)
Encouraged DASH or MIND diet, decrease po intake and increase exercise as tolerated. Needs 7-8 hours of sleep nightly. Avoid trans fats, eat small, frequent meals every 4-5 hours with lean proteins, complex carbs and healthy fats. Minimize simple carbs, high fat foods and processed foods good weight loss with [facility] Keen

## 2022-12-22 NOTE — Progress Notes (Signed)
Subjective:   By signing my name below, I, Phillip Doyle, attest that this documentation has been prepared under the direction and in the presence of Phillip Canary, MD. 12/22/2022   Patient ID: Phillip Doyle, male    DOB: 16-Aug-1956, 67 y.o.   MRN: [id number]  Chief Complaint  Patient presents with   Follow-up    Follow up    HPI Patient is in today for a follow-up appointment.   Blood pressure His blood pressure is elevated during this visit. He intermittently checks his blood pressure at home. He denies any headaches or chest pain from hypertension. BP Readings from Last 3 Encounters:  12/22/22 132/80  09/16/22 130/74  02/25/22 128/70    Pulse Readings from Last 3 Encounters:  12/22/22 95  09/16/22 89  02/25/22 94    Blood sugar His blood sugars have been lower lately while using 10 mg injections of mounjaro. He has lost over 10 lbs while taking it. No concerning side effects Wt Readings from Last 3 Encounters:  12/22/22 238 lb 9.6 oz (108.2 kg)  09/16/22 249 lb 3.2 oz (113 kg)  02/25/22 232 lb 6.4 oz (105.4 kg)    Testosterone He has not been able to receive 50 mg testim. His insurance would not cover they report his deficit was not great enough.   Past Medical History:  Diagnosis Date   Alcohol abuse 11/28/2013   ALLERGIC RHINITIS 02/15/2007   Allergy    rhinitis   Bronchitis 07/14/2012   Diabetes mellitus    DM 10/24/2010   ED (erectile dysfunction) 02/17/2012   EXOGENOUS OBESITY 02/15/2007   H/O alcohol abuse 11/28/2013   H/O tobacco use, presenting hazards to health 07/04/2010   Qualifier: Diagnosis of  By: Abner Greenspan MD, Misty Stanley  1/2 ppd    HTN (hypertension) 08/20/2011   Hypertension    HYPERTENSION 02/15/2007   Impaired glucose tolerance    Low testosterone in male 08/01/2016   Low testosterone level in male 08/01/2016   LUMBAR STRAIN 10/17/2010   Mixed hyperlipidemia 07/04/2010   NISSEN FUNDOPLICATION, HX OF 02/15/2007   OA (osteoarthritis) of knee 02/17/2012    Obesity    PEPTIC ULCER DISEASE 02/15/2007   Personal history of colonic polyps 01/17/2013   colonoscoppy 2013, benign polyp advised repeat in 10 years   Preventative health care 07/19/2012   Restless sleeper 10/30/2016   TOBACCO ABUSE 07/04/2010   Ulcer 1982   peptic ulcer disease    Past Surgical History:  Procedure Laterality Date   COLONOSCOPY  03/2012   Several hyperplastic polyps   fundiplication for [facility] and reflux  1987   negative stress test  2008   ROTATOR CUFF REPAIR  2000   VASECTOMY  1982    Family History  Problem Relation Age of Onset   Cancer Mother        uterine    Asthma Mother    Alcohol abuse Father    Cancer Sister        Brain tumor   Hypertension Brother    Diabetes Brother    Heart disease Brother        aortic valve replaced, CAD   Cancer Brother        prostate   Stroke Maternal Grandmother        possibly   Hypertension Maternal Grandfather    Colon cancer Neg Hx     Social History   Socioeconomic History   Marital status: Married    Spouse  name: Phillip Doyle   Number of children: Not on file   Years of education: Not on file   Highest education level: Not on file  Occupational History    Employer: [employer]  Tobacco Use   Smoking status: Former    Packs/day: 2.00    Years: 40.00    Additional pack years: 0.00    Total pack years: 80.00    Types: Cigarettes    Quit date: 06/28/2014    Years since quitting: 8.4   Smokeless tobacco: Former    Quit date: 03/18/2012   Tobacco comments:    quit 06/18/10- started back   Vaping Use   Vaping Use: Not on file  Substance and Sexual Activity   Alcohol use: Yes    Alcohol/week: 3.0 - 4.0 standard drinks of alcohol    Types: 3 - 4 Cans of beer per week    Comment: 3-4 every other day   Drug use: No   Sexual activity: Yes    Partners: Female  Other Topics Concern   Not on file  Social History Narrative   Not on file   Social Determinants of Health   Financial Resource Strain: Not on  file  Food Insecurity: Not on file  Transportation Needs: Not on file  Physical Activity: Not on file  Stress: Not on file  Social Connections: Not on file  Intimate Partner Violence: Not on file    Outpatient Medications Prior to Visit  Medication Sig Dispense Refill   acetaminophen (TYLENOL) 650 MG CR tablet Take 1,300 mg by mouth 2 (two) times daily as needed for pain.     albuterol (VENTOLIN HFA) 108 (90 Base) MCG/ACT inhaler TAKE 2 PUFFS INTO LUNGS EVERY 6 HOURS AS NEEDED FOR WHEEZE OR SHORTNESS OF BREATH 18 each 12   FREESTYLE LITE test strip CHECK BLOOD SUGAR TWICE DAILY. DX: E11.9**VERIFY PATIENTS METER** 100 strip 0   glimepiride (AMARYL) 1 MG tablet TAKE 1 TABLET BY MOUTH EVERY DAY WITH BREAKFAST 90 tablet 1   Lancets (FREESTYLE) lancets Check blood sugar twice daily.  DX E11.9 100 each 6   lisinopril-hydrochlorothiazide (ZESTORETIC) 20-12.5 MG tablet TAKE 1 TABLET BY MOUTH TWICE A DAY 180 tablet 1   metFORMIN (GLUCOPHAGE) 1000 MG tablet TAKE 1 TABLET (1,000 MG TOTAL) BY MOUTH TWICE A DAY WITH FOOD 180 tablet 0   Multiple Vitamins-Minerals (PA MENS 50 PLUS VITAPAK PO) Take 1 capsule by mouth daily.     Oral Electrolytes (H-E-B ORAL ELECTROLYTE PO) Take by mouth.     testosterone (TESTIM) 50 MG/5GM (1%) GEL Place 5 g onto the skin daily. 150 g 2   tirzepatide (MOUNJARO) 10 MG/0.5ML Pen Inject 10 mg into the skin once a week. 6 mL 0   metoprolol (TOPROL-XL) 200 MG 24 hr tablet TAKE 1 TABLET BY MOUTH EVERY DAY 90 tablet 1   simvastatin (ZOCOR) 20 MG tablet Take 1 tablet (20 mg total) by mouth daily at 6 PM. 90 tablet 1   tirzepatide (MOUNJARO) 2.5 MG/0.5ML Pen Inject 2.5 mg into the skin once a week. 2 mL 0   tirzepatide (MOUNJARO) 5 MG/0.5ML Pen Inject 5 mg into the skin once a week. 6 mL 0   tirzepatide (MOUNJARO) 7.5 MG/0.5ML Pen Inject 7.5 mg into the skin once a week. 6 mL 0   No facility-administered medications prior to visit.    No Known Allergies  ROS      Objective:    Physical Exam Constitutional:  General: He is not in acute distress.    Appearance: Normal appearance.  HENT:     Head: Normocephalic and atraumatic.     Right Ear: External ear normal.     Left Ear: External ear normal.  Eyes:     Extraocular Movements: Extraocular movements intact.     Pupils: Pupils are equal, round, and reactive to light.  Cardiovascular:     Rate and Rhythm: Normal rate and regular rhythm.     Heart sounds: Normal heart sounds. No murmur heard.    No gallop.  Pulmonary:     Effort: Pulmonary effort is normal. No respiratory distress.     Breath sounds: Normal breath sounds. No wheezing or rales.  Skin:    General: Skin is warm.  Neurological:     Mental Status: He is alert and oriented to person, place, and time.  Psychiatric:        Judgment: Judgment normal.     BP 132/80   Pulse 95   Temp 98 F (36.7 C) (Oral)   Resp 16   Ht 5\' 9"  (1.753 m)   Wt 238 lb 9.6 oz (108.2 kg)   SpO2 96%   BMI 35.24 kg/m  Wt Readings from Last 3 Encounters:  12/22/22 238 lb 9.6 oz (108.2 kg)  09/16/22 249 lb 3.2 oz (113 kg)  02/25/22 232 lb 6.4 oz (105.4 kg)       Assessment & Plan:  Type 2 diabetes mellitus with obesity (HCC) Assessment & Plan: hgba1c acceptable, minimize simple carbs. Increase exercise as tolerated. Continue current meds on Mounjaro 10 mg weekly  Orders: -     Hemoglobin A1c  COPD mixed type (HCC) Assessment & Plan: No recent exacerbation   Primary hypertension Assessment & Plan: Well controlled, no changes to meds. Encouraged heart healthy diet such as the DASH diet and exercise as tolerated. Improved on recheck, reports good control at home. Check weekly at rest  Orders: -     CBC with Differential/Platelet -     Comprehensive metabolic panel -     TSH  Mixed hyperlipidemia Assessment & Plan: Encourage heart healthy diet such as MIND or DASH diet, increase exercise, avoid trans fats, simple carbohydrates  and processed foods, consider a krill or fish or flaxseed oil cap daily. Tolerating Simvastatin  Orders: -     Lipid panel  Testosterone deficiency in male Assessment & Plan: Supplement and monitor   Orders: -     Testosterone  Vitamin D deficiency -     VITAMIN D 25 Hydroxy (Vit-D Deficiency, Fractures)  Obesity due to excess calories with serious comorbidity, unspecified classification Assessment & Plan: Encouraged DASH or MIND diet, decrease po intake and increase exercise as tolerated. Needs 7-8 hours of sleep nightly. Avoid trans fats, eat small, frequent meals every 4-5 hours with lean proteins, complex carbs and healthy fats. Minimize simple carbs, high fat foods and processed foods good weight loss with Mounjaro   Other orders -     Metoprolol Succinate ER; Take 1 tablet (200 mg total) by mouth daily.  Dispense: 90 tablet; Refill: 1 -     Simvastatin; Take 1 tablet (20 mg total) by mouth daily at 6 PM.  Dispense: 90 tablet; Refill: 1    I, Danise Edge, MD, personally preformed the services described in this documentation.  All medical record entries made by the scribe were at my direction and in my presence.  I have reviewed the chart and discharge instructions (  if applicable) and agree that the record reflects my personal performance and is accurate and complete. 12/22/2022  Danise Edge, MD  Mercer Pod as a scribe for Danise Edge, MD.,have documented all relevant documentation on the behalf of Danise Edge, MD,as directed by  Danise Edge, MD while in the presence of Danise Edge, MD.

## 2022-12-24 ENCOUNTER — Other Ambulatory Visit: Payer: Self-pay | Admitting: Family Medicine

## 2022-12-24 ENCOUNTER — Other Ambulatory Visit: Payer: Self-pay

## 2022-12-24 MED ORDER — LISINOPRIL 20 MG PO TABS: 20.0000 mg | ORAL_TABLET | Freq: Two times a day (BID) | ORAL | 2 refills | Status: DC

## 2022-12-24 MED ORDER — TIRZEPATIDE 10 MG/0.5ML ~~LOC~~ SOAJ: 10.0000 mg | SUBCUTANEOUS | 0 refills | Status: DC

## 2022-12-24 NOTE — Telephone Encounter (Signed)
Pharmacy comment: INS WONT COVER 2 TABLETS, DO YOU WANT TO CHANGE TO LISINOPRIL 40MG  1QD? PLEASE ADVISE

## 2022-12-25 ENCOUNTER — Other Ambulatory Visit: Payer: Self-pay

## 2022-12-25 MED ORDER — LISINOPRIL 40 MG PO TABS: 40.0000 mg | ORAL_TABLET | Freq: Every day | ORAL | 3 refills | Status: DC

## 2022-12-29 ENCOUNTER — Other Ambulatory Visit: Payer: Self-pay | Admitting: Family Medicine

## 2022-12-30 ENCOUNTER — Other Ambulatory Visit (HOSPITAL_COMMUNITY): Payer: Self-pay

## 2022-12-30 ENCOUNTER — Encounter: Payer: Self-pay | Admitting: Family Medicine

## 2022-12-30 ENCOUNTER — Other Ambulatory Visit: Payer: Self-pay

## 2022-12-30 MED ORDER — LISINOPRIL 40 MG PO TABS
40.0000 mg | ORAL_TABLET | Freq: Every day | ORAL | 3 refills | Status: DC
  Filled 2022-12-30: qty 90, 90d supply, fill #0

## 2022-12-30 MED ORDER — TIRZEPATIDE 10 MG/0.5ML ~~LOC~~ SOAJ
10.0000 mg | SUBCUTANEOUS | 0 refills | Status: DC
  Filled 2022-12-30: qty 6, 84d supply, fill #0

## 2023-01-02 ENCOUNTER — Other Ambulatory Visit: Payer: Self-pay | Admitting: Family Medicine

## 2023-01-02 ENCOUNTER — Other Ambulatory Visit (HOSPITAL_COMMUNITY): Payer: Self-pay

## 2023-03-18 ENCOUNTER — Encounter: Payer: Self-pay | Admitting: Family Medicine

## 2023-03-24 ENCOUNTER — Other Ambulatory Visit: Payer: Self-pay

## 2023-03-24 MED ORDER — SIMVASTATIN 20 MG PO TABS: 20.0000 mg | ORAL_TABLET | Freq: Every day | ORAL | 1 refills | Status: DC

## 2023-03-24 MED ORDER — TIRZEPATIDE 10 MG/0.5ML ~~LOC~~ SOAJ: 10.0000 mg | SUBCUTANEOUS | 0 refills | Status: DC

## 2023-03-24 MED ORDER — METFORMIN HCL 1000 MG PO TABS: ORAL_TABLET | ORAL | 1 refills | Status: DC

## 2023-03-24 MED ORDER — METOPROLOL SUCCINATE ER 200 MG PO TB24: 200.0000 mg | ORAL_TABLET | Freq: Every day | ORAL | 1 refills | Status: DC

## 2023-03-24 MED ORDER — GLIMEPIRIDE 1 MG PO TABS: ORAL_TABLET | ORAL | 1 refills | Status: DC

## 2023-04-02 ENCOUNTER — Encounter (INDEPENDENT_AMBULATORY_CARE_PROVIDER_SITE_OTHER): Payer: Self-pay

## 2023-04-28 ENCOUNTER — Ambulatory Visit (INDEPENDENT_AMBULATORY_CARE_PROVIDER_SITE_OTHER): Payer: BC Managed Care – PPO | Admitting: Family

## 2023-04-28 ENCOUNTER — Ambulatory Visit: Payer: BC Managed Care – PPO | Admitting: Family Medicine

## 2023-04-28 VITALS — BP 160/72 | HR 88 | Temp 98.4°F | Resp 16 | Wt 244.0 lb

## 2023-04-28 DIAGNOSIS — R7989 Other specified abnormal findings of blood chemistry: Secondary | ICD-10-CM | POA: Diagnosis not present

## 2023-04-28 DIAGNOSIS — E669 Obesity, unspecified: Secondary | ICD-10-CM | POA: Diagnosis not present

## 2023-04-28 DIAGNOSIS — J45909 Unspecified asthma, uncomplicated: Secondary | ICD-10-CM | POA: Insufficient documentation

## 2023-04-28 DIAGNOSIS — Z1211 Encounter for screening for malignant neoplasm of colon: Secondary | ICD-10-CM

## 2023-04-28 DIAGNOSIS — E1169 Type 2 diabetes mellitus with other specified complication: Secondary | ICD-10-CM

## 2023-04-28 DIAGNOSIS — J449 Chronic obstructive pulmonary disease, unspecified: Secondary | ICD-10-CM

## 2023-04-28 DIAGNOSIS — Z23 Encounter for immunization: Secondary | ICD-10-CM | POA: Diagnosis not present

## 2023-04-28 DIAGNOSIS — Z7985 Long-term (current) use of injectable non-insulin antidiabetic drugs: Secondary | ICD-10-CM | POA: Diagnosis not present

## 2023-04-28 DIAGNOSIS — I10 Essential (primary) hypertension: Secondary | ICD-10-CM

## 2023-04-28 DIAGNOSIS — E782 Mixed hyperlipidemia: Secondary | ICD-10-CM | POA: Diagnosis not present

## 2023-04-28 DIAGNOSIS — Z87891 Personal history of nicotine dependence: Secondary | ICD-10-CM

## 2023-04-28 DIAGNOSIS — Z7185 Encounter for immunization safety counseling: Secondary | ICD-10-CM

## 2023-04-28 LAB — HEMOGLOBIN A1C: Hgb A1c MFr Bld: 6.8 % — ABNORMAL HIGH (ref 4.6–6.5)

## 2023-04-28 MED ORDER — ALBUTEROL SULFATE HFA 108 (90 BASE) MCG/ACT IN AERS
2.0000 | INHALATION_SPRAY | Freq: Four times a day (QID) | RESPIRATORY_TRACT | 12 refills | Status: DC | PRN
Start: 2023-04-28 — End: 2023-12-01

## 2023-04-28 MED ORDER — CARVEDILOL 25 MG PO TABS: 25.0000 mg | ORAL_TABLET | Freq: Two times a day (BID) | ORAL | 3 refills | Status: DC

## 2023-04-28 MED ORDER — FREESTYLE LITE TEST VI STRP
ORAL_STRIP | 0 refills | Status: AC
Start: 2023-04-28 — End: ?

## 2023-04-28 MED ORDER — TIRZEPATIDE 10 MG/0.5ML ~~LOC~~ SOAJ: 10.0000 mg | SUBCUTANEOUS | 0 refills | Status: DC

## 2023-04-28 MED ORDER — FREESTYLE LANCETS MISC
6 refills | Status: AC
Start: 2023-04-28 — End: ?

## 2023-04-28 NOTE — Assessment & Plan Note (Addendum)
BP Readings from Last 3 Encounters:  04/28/23 (!) 157/77  12/22/22 132/80  09/16/22 130/74    Blood pressure elevated at today's visit (162/70) despite current regimen of Lisinopril 40mg  and Metoprolol 200mg . -Switch Metoprolol to Carvedilol, a similar medication with better blood pressure control.

## 2023-04-28 NOTE — Progress Notes (Signed)
Subjective:     Patient ID: Phillip Doyle, male    DOB: 30-Dec-1955, 67 y.o.   MRN: [id number]  Chief Complaint  Patient presents with   Hypertension    Here for follow up   Diabetes    Here for follow up    HPI  Discussed the use of AI scribe software for clinical note transcription with the patient, who gave verbal consent to proceed.  History of Present Illness   The patient, with a history of diabetes and low testosterone, presents for a regular follow-up. He reports that he has been off his diabetes medication, Mounjaro, for two weeks due to needing a new prescription. He denies any side effects from the medication and notes that it suppresses his appetite. He has lost weight since starting the medication and his blood sugar levels have been between 132 and 168.  Regarding his low testosterone, he reports that his insurance does not cover the gel medication. He has tried injections in the past but did not notice any change. He declines a referral to urology at this time or retrial of testosterone injections.  The patient also has high cholesterol, which is well-managed with simvastatin. He has had a sleep study in the remote past and was told that he did not need a cpap. He is 30 pounds lighter now than when the study was conducted.    His blood pressure was high at this visit, despite taking lisinopril and metoprolol.  He has had a colonoscopy in the past and is due for another one. He has occasional breathing issues, which are triggered by pollen and managed with albuterol. He declines a lung cancer screening at this time. He is up to date on his flu shot.      Wt Readings from Last 3 Encounters:  04/28/23 244 lb (110.7 kg)  12/22/22 238 lb 9.6 oz (108.2 kg)  09/16/22 249 lb 3.2 oz (113 kg)    Lab Results  Component Value Date   HGBA1C 6.8 (H) 04/28/2023   HGBA1C 7.1 (H) 12/22/2022   HGBA1C 7.9 (H) 09/16/2022   Lab Results  Component Value Date   MICROALBUR 9.7 (H)  11/19/2021   LDLCALC 40 12/22/2022   CREATININE 0.90 12/22/2022       Health Maintenance Due  Topic Date Due   Lung Cancer Screening  Never done   Colonoscopy  04/12/2022   FOOT EXAM  05/21/2022   Diabetic kidney evaluation - Urine ACR  11/20/2022   COVID-19 Vaccine (5 - 2023-24 season) 04/19/2023    Past Medical History:  Diagnosis Date   Alcohol abuse 11/28/2013   ALLERGIC RHINITIS 02/15/2007   Allergy    rhinitis   Bronchitis 07/14/2012   Diabetes mellitus    DM 10/24/2010   ED (erectile dysfunction) 02/17/2012   EXOGENOUS OBESITY 02/15/2007   H/O alcohol abuse 11/28/2013   H/O tobacco use, presenting hazards to health 07/04/2010   Qualifier: Diagnosis of  By: Abner Greenspan MD, Misty Stanley  1/2 ppd    HTN (hypertension) 08/20/2011   Hypertension    HYPERTENSION 02/15/2007   Impaired glucose tolerance    Low testosterone in male 08/01/2016   Low testosterone level in male 08/01/2016   LUMBAR STRAIN 10/17/2010   Mixed hyperlipidemia 07/04/2010   NISSEN FUNDOPLICATION, HX OF 02/15/2007   OA (osteoarthritis) of knee 02/17/2012   Obesity    PEPTIC ULCER DISEASE 02/15/2007   Personal history of colonic polyps 01/17/2013   colonoscoppy 2013, benign polyp advised  repeat in 10 years   Preventative health care 07/19/2012   Restless sleeper 10/30/2016   TOBACCO ABUSE 07/04/2010   Ulcer 1982   peptic ulcer disease    Past Surgical History:  Procedure Laterality Date   COLONOSCOPY  03/2012   Several hyperplastic polyps   fundiplication for [facility] and reflux  1987   negative stress test  2008   ROTATOR CUFF REPAIR  2000   VASECTOMY  1982    Family History  Problem Relation Age of Onset   Cancer Mother        uterine    Asthma Mother    Alcohol abuse Father    Cancer Sister        Brain tumor   Hypertension Brother    Diabetes Brother    Heart disease Brother        aortic valve replaced, CAD   Cancer Brother        prostate   Stroke Maternal Grandmother        possibly   Hypertension  Maternal Grandfather    Colon cancer Neg Hx     Social History   Socioeconomic History   Marital status: Married    Spouse name: [name]   Number of children: Not on file   Years of education: Not on file   Highest education level: 12th grade  Occupational History    Employer: [employer]  Tobacco Use   Smoking status: Former    Current packs/day: 0.00    Average packs/day: 2.0 packs/day for 40.0 years (80.0 ttl pk-yrs)    Types: Cigarettes    Start date: 06/28/1974    Quit date: 06/28/2014    Years since quitting: 8.8   Smokeless tobacco: Former    Quit date: 03/18/2012   Tobacco comments:    quit 06/18/10- started back   Vaping Use   Vaping status: Not on file  Substance and Sexual Activity   Alcohol use: Yes    Alcohol/week: 3.0 - 4.0 standard drinks of alcohol    Types: 3 - 4 Cans of beer per week    Comment: 3-4 every other day   Drug use: No   Sexual activity: Yes    Partners: Female  Other Topics Concern   Not on file  Social History Narrative   Not on file   Social Determinants of Health   Financial Resource Strain: Low Risk  (04/22/2023)   Overall Financial Resource Strain (CARDIA)    Difficulty of Paying Living Expenses: Not hard at all  Food Insecurity: No Food Insecurity (04/22/2023)   Hunger Vital Sign    Worried About Running Out of Food in the Last Year: Never true    Ran Out of Food in the Last Year: Never true  Transportation Needs: No Transportation Needs (04/22/2023)   PRAPARE - Administrator, Civil Service (Medical): No    Lack of Transportation (Non-Medical): No  Physical Activity: Insufficiently Active (04/22/2023)   Exercise Vital Sign    Days of Exercise per Week: 3 days    Minutes of Exercise per Session: 30 min  Stress: No Stress Concern Present (04/22/2023)   [employer] of Occupational Health - Occupational Stress Questionnaire    Feeling of Stress : Not at all  Social Connections: Socially Isolated (04/22/2023)   Social  Connection and Isolation Panel [NHANES]    Frequency of Communication with Friends and Family: Once a week    Frequency of Social Gatherings with Friends and Family:  Once a week    Attends Religious Services: Never    Active Member of Clubs or Organizations: No    Attends Engineer, structural: Not on file    Marital Status: Married  Catering manager Violence: Not on file    Outpatient Medications Prior to Visit  Medication Sig Dispense Refill   acetaminophen (TYLENOL) 650 MG CR tablet Take 1,300 mg by mouth 2 (two) times daily as needed for pain.     glimepiride (AMARYL) 1 MG tablet TAKE 1 TABLET BY MOUTH EVERY DAY WITH BREAKFAST 90 tablet 1   lisinopril (ZESTRIL) 40 MG tablet Take 1 tablet (40 mg total) by mouth daily. 90 tablet 3   metFORMIN (GLUCOPHAGE) 1000 MG tablet TAKE 1 TABLET (1,000 MG TOTAL) BY MOUTH TWICE A DAY WITH FOOD 180 tablet 1   Multiple Vitamins-Minerals (PA MENS 50 PLUS VITAPAK PO) Take 1 capsule by mouth daily.     Oral Electrolytes (H-E-B ORAL ELECTROLYTE PO) Take by mouth.     simvastatin (ZOCOR) 20 MG tablet Take 1 tablet (20 mg total) by mouth daily at 6 PM. 90 tablet 1   albuterol (VENTOLIN HFA) 108 (90 Base) MCG/ACT inhaler TAKE 2 PUFFS INTO LUNGS EVERY 6 HOURS AS NEEDED FOR WHEEZE OR SHORTNESS OF BREATH 18 each 12   FREESTYLE LITE test strip CHECK BLOOD SUGAR TWICE DAILY. DX: E11.9**VERIFY PATIENTS METER** 100 strip 0   Lancets (FREESTYLE) lancets Check blood sugar twice daily.  DX E11.9 100 each 6   metoprolol (TOPROL-XL) 200 MG 24 hr tablet Take 1 tablet (200 mg total) by mouth daily. 90 tablet 1   testosterone (TESTIM) 50 MG/5GM (1%) GEL Place 5 g onto the skin daily. 150 g 2   tirzepatide (MOUNJARO) 10 MG/0.5ML Pen Inject 10 mg into the skin once a week. 6 mL 0   No facility-administered medications prior to visit.    No Known Allergies  ROS    See HPI Objective:    Physical Exam Constitutional:      General: He is not in acute  distress.    Appearance: He is well-developed.  HENT:     Head: Normocephalic and atraumatic.  Cardiovascular:     Rate and Rhythm: Normal rate and regular rhythm.     Heart sounds: No murmur heard. Pulmonary:     Effort: Pulmonary effort is normal. No respiratory distress.     Breath sounds: Normal breath sounds. No wheezing or rales.  Skin:    General: Skin is warm and dry.  Neurological:     Mental Status: He is alert and oriented to person, place, and time.  Psychiatric:        Behavior: Behavior normal.        Thought Content: Thought content normal.      BP (!) 160/72   Pulse 88   Temp 98.4 F (36.9 C) (Oral)   Resp 16   Wt 244 lb (110.7 kg)   SpO2 97%   BMI 36.03 kg/m  Wt Readings from Last 3 Encounters:  04/28/23 244 lb (110.7 kg)  12/22/22 238 lb 9.6 oz (108.2 kg)  09/16/22 249 lb 3.2 oz (113 kg)       Assessment & Plan:   Problem List Items Addressed This Visit       Unprioritized   Type 2 diabetes mellitus with obesity (HCC) - Primary     Patient reports good control with Mounjaro, with recent fasting blood sugars ranging from 132-168. Patient has been  off medication for two weeks due to needing a new prescription. -Renew Mounjaro prescription. -Continue self-monitoring of blood glucose.  A1C is at goal today.  Lab Results  Component Value Date   HGBA1C 6.8 (H) 04/28/2023         Relevant Medications   glucose blood (FREESTYLE LITE) test strip   Lancets (FREESTYLE) lancets   tirzepatide (MOUNJARO) 10 MG/0.5ML Pen   Other Relevant Orders   HgB A1c (Completed)   Urine Microalbumin w/creat. ratio   Mixed hyperlipidemia    Lab Results  Component Value Date   CHOL 93 12/22/2022   HDL 36.00 (L) 12/22/2022   LDLCALC 40 12/22/2022   LDLDIRECT 65.0 11/19/2021   TRIG 87.0 12/22/2022   CHOLHDL 3 12/22/2022   LDL at goal on simvastatin. Continue same.       Relevant Medications   carvedilol (COREG) 25 MG tablet   Low testosterone in male     Declines therapy- declines referral. Androgel was not covered by insurance. Offered injections- he declined.       Immunization counseling     Influenza Vaccination Due for annual influenza vaccine. -Administer influenza vaccine today.  COVID-19 Booster Patient reports plans to receive booster at local pharmacy. -Encourage patient to follow through with receiving booster.      HTN (hypertension)    BP Readings from Last 3 Encounters:  04/28/23 (!) 157/77  12/22/22 132/80  09/16/22 130/74    Blood pressure elevated at today's visit (162/70) despite current regimen of Lisinopril 40mg  and Metoprolol 200mg . -Switch Metoprolol to Carvedilol, a similar medication with better blood pressure control.       Relevant Medications   carvedilol (COREG) 25 MG tablet   H/O tobacco use, presenting hazards to health     Lung Cancer Screening Patient is a candidate for annual lung cancer screening but wishes to consider it further. -Respect patient's decision and revisit in future visits.      COPD mixed type (HCC)   Relevant Medications   albuterol (VENTOLIN HFA) 108 (90 Base) MCG/ACT inhaler   Asthma     Albuterol use as needed, primarily triggered by pollen. -Continue current management.      Relevant Medications   albuterol (VENTOLIN HFA) 108 (90 Base) MCG/ACT inhaler   Other Visit Diagnoses     Screening for colon cancer       Relevant Orders   Ambulatory referral to Gastroenterology   Needs flu shot       Relevant Orders   Flu Vaccine Trivalent High Dose (Fluad) (Completed)       I have discontinued Lawrnce A. Briere's testosterone and metoprolol. I have also changed his albuterol and FREESTYLE LITE. Additionally, I am having him start on carvedilol. Lastly, I am having him maintain his Multiple Vitamins-Minerals (PA MENS 50 PLUS VITAPAK PO), acetaminophen, Oral Electrolytes (H-E-B ORAL ELECTROLYTE PO), lisinopril, glimepiride, metFORMIN, simvastatin, freestyle, and  tirzepatide.  Meds ordered this encounter  Medications   albuterol (VENTOLIN HFA) 108 (90 Base) MCG/ACT inhaler    Sig: Inhale 2 puffs into the lungs every 6 (six) hours as needed for wheezing or shortness of breath.    Dispense:  18 each    Refill:  12   glucose blood (FREESTYLE LITE) test strip    Sig: CHECK BLOOD SUGAR TWICE DAILY. DX: E11.9**VERIFY PATIENTS METER**    Dispense:  100 strip    Refill:  0   Lancets (FREESTYLE) lancets    Sig: Check blood sugar twice daily.  DX E11.9    Dispense:  100 each    Refill:  6   tirzepatide (MOUNJARO) 10 MG/0.5ML Pen    Sig: Inject 10 mg into the skin once a week.    Dispense:  6 mL    Refill:  0   carvedilol (COREG) 25 MG tablet    Sig: Take 1 tablet (25 mg total) by mouth 2 (two) times daily with a meal.    Dispense:  60 tablet    Refill:  3    Order Specific Question:   Supervising Provider    Answer:   Danise Edge A [[id number]]

## 2023-04-28 NOTE — Assessment & Plan Note (Signed)
  Lung Cancer Screening Patient is a candidate for annual lung cancer screening but wishes to consider it further. -Respect patient's decision and revisit in future visits.

## 2023-04-28 NOTE — Assessment & Plan Note (Signed)
Lab Results  Component Value Date   CHOL 93 12/22/2022   HDL 36.00 (L) 12/22/2022   LDLCALC 40 12/22/2022   LDLDIRECT 65.0 11/19/2021   TRIG 87.0 12/22/2022   CHOLHDL 3 12/22/2022   LDL at goal on simvastatin. Continue same.

## 2023-04-28 NOTE — Patient Instructions (Signed)
VISIT SUMMARY:  During your visit, we discussed your diabetes, high blood pressure, high cholesterol, low testosterone, asthma, and the need for colon cancer screening. We also talked about your flu shot and COVID-19 booster. Your blood sugar levels have been well-controlled with Mounjaro, but you've been off this medication for two weeks. Your high blood pressure was a concern, despite your current medications. Your high cholesterol is well-managed with Simvastatin. You've had no noticeable benefit from testosterone therapy and you're managing your asthma with Albuterol. You're due for a colonoscopy and you're considering lung cancer screening.  YOUR PLAN:  -DIABETES: Diabetes is a condition where your blood sugar levels are too high. We will renew your [facility] prescription and you should continue monitoring your blood sugar levels.  -HIGH BLOOD PRESSURE: High blood pressure can lead to serious health problems. We will switch your Metoprolol to Carvedilol, which should help control your blood pressure better.  -HIGH CHOLESTEROL: High cholesterol can increase your risk of heart disease. You should continue taking Simvastatin to manage this.  -LOW TESTOSTERONE: Low testosterone can cause various symptoms, but as you've noticed no benefit from previous therapy, we will make no changes to your current management.  -ASTHMA: Asthma is a condition that affects your breathing. Continue managing this with Albuterol as needed.  -COLON CANCER SCREENING: Regular colonoscopies can help detect colon cancer early. We will refer you to a GI specialist for a colonoscopy.  -LUNG CANCER SCREENING: Lung cancer screening can help detect lung cancer early. We respect your decision to consider this further and will revisit it in future visits.  -INFLUENZA VACCINATION: The flu shot helps protect you from the flu. We will administer your flu shot today.  -COVID-19 BOOSTER: The COVID-19 booster helps enhance  protection against COVID-19. We encourage you to get your booster at your local pharmacy as planned.  INSTRUCTIONS:  Please make sure to pick up your renewed [facility] prescription and continue monitoring your blood sugar levels. Start taking Carvedilol instead of Metoprolol for your high blood pressure. Continue taking Simvastatin for your high cholesterol and Albuterol for your asthma as needed. We will refer you for a colonoscopy, and we encourage you to consider lung cancer screening. Get your flu shot today and your COVID-19 booster at your local pharmacy. We will see you again in 3 months.

## 2023-04-28 NOTE — Assessment & Plan Note (Signed)
  Albuterol use as needed, primarily triggered by pollen. -Continue current management.

## 2023-04-28 NOTE — Assessment & Plan Note (Signed)
Declines therapy- declines referral. Androgel was not covered by insurance. Offered injections- he declined.

## 2023-04-28 NOTE — Assessment & Plan Note (Signed)
  Influenza Vaccination Due for annual influenza vaccine. -Administer influenza vaccine today.  COVID-19 Booster Patient reports plans to receive booster at local pharmacy. -Encourage patient to follow through with receiving booster.

## 2023-04-28 NOTE — Assessment & Plan Note (Addendum)
  Patient reports good control with Mounjaro, with recent fasting blood sugars ranging from 132-168. Patient has been off medication for two weeks due to needing a new prescription. -Renew Mounjaro prescription. -Continue self-monitoring of blood glucose.  A1C is at goal today.  Lab Results  Component Value Date   HGBA1C 6.8 (H) 04/28/2023

## 2023-04-29 LAB — MICROALBUMIN / CREATININE URINE RATIO
Creatinine,U: 95.2 mg/dL
Microalb Creat Ratio: 34.5 mg/g — ABNORMAL HIGH (ref 0.0–30.0)
Microalb, Ur: 32.9 mg/dL — ABNORMAL HIGH (ref 0.0–1.9)

## 2023-05-05 ENCOUNTER — Encounter: Payer: Self-pay | Admitting: Family

## 2023-05-06 ENCOUNTER — Other Ambulatory Visit (HOSPITAL_COMMUNITY): Payer: Self-pay

## 2023-05-06 ENCOUNTER — Telehealth: Payer: Self-pay

## 2023-05-06 NOTE — Telephone Encounter (Signed)
Pharmacy Patient Advocate Encounter   Received notification from Patient Advice Request messages that prior authorization for Mounjaro 10MG /0.5ML pen-injectors is required/requested.   Insurance verification completed.   The patient is insured through [facility] .   Per test claim: PA required; PA submitted to [facility] via CoverMyMeds Key/confirmation #/EOC [id number] Status is pending

## 2023-05-07 NOTE — Telephone Encounter (Signed)
Pharmacy Patient Advocate Encounter  Received notification from [facility] that Prior Authorization for  Mounjaro 10MG /0.5ML pen-injectors  has been APPROVED from 05/06/23 to 05/05/26   PA #/Case ID/Reference #: [id number]

## 2023-05-14 ENCOUNTER — Encounter: Payer: Self-pay | Admitting: Family

## 2023-07-06 ENCOUNTER — Telehealth: Payer: Self-pay | Admitting: Family Medicine

## 2023-07-06 DIAGNOSIS — Z0279 Encounter for issue of other medical certificate: Secondary | ICD-10-CM

## 2023-07-06 NOTE — Telephone Encounter (Signed)
Pt's spouse dropped off document to be filled out by provider ( [facility] Physical Form) Pt's spouse would like document to be faxed when ready to [phone]. Document put at front office tray under providers name.

## 2023-07-06 NOTE — Telephone Encounter (Signed)
Forms have been placed Dr. Mariel Aloe folder

## 2023-07-07 LAB — HM DIABETES EYE EXAM

## 2023-07-08 NOTE — Telephone Encounter (Signed)
Forms have been filled out and sent to [employer]

## 2023-09-02 ENCOUNTER — Ambulatory Visit (INDEPENDENT_AMBULATORY_CARE_PROVIDER_SITE_OTHER): Payer: 59 | Admitting: Family

## 2023-09-02 VITALS — BP 148/75 | HR 84 | Temp 98.5°F | Resp 16 | Ht 69.0 in | Wt 247.0 lb

## 2023-09-02 DIAGNOSIS — E669 Obesity, unspecified: Secondary | ICD-10-CM | POA: Diagnosis not present

## 2023-09-02 DIAGNOSIS — Z7984 Long term (current) use of oral hypoglycemic drugs: Secondary | ICD-10-CM

## 2023-09-02 DIAGNOSIS — E782 Mixed hyperlipidemia: Secondary | ICD-10-CM

## 2023-09-02 DIAGNOSIS — I10 Essential (primary) hypertension: Secondary | ICD-10-CM | POA: Diagnosis not present

## 2023-09-02 DIAGNOSIS — Z1211 Encounter for screening for malignant neoplasm of colon: Secondary | ICD-10-CM

## 2023-09-02 DIAGNOSIS — E1169 Type 2 diabetes mellitus with other specified complication: Secondary | ICD-10-CM | POA: Diagnosis not present

## 2023-09-02 DIAGNOSIS — E291 Testicular hypofunction: Secondary | ICD-10-CM

## 2023-09-02 DIAGNOSIS — J449 Chronic obstructive pulmonary disease, unspecified: Secondary | ICD-10-CM

## 2023-09-02 DIAGNOSIS — J309 Allergic rhinitis, unspecified: Secondary | ICD-10-CM

## 2023-09-02 MED ORDER — CARVEDILOL 25 MG PO TABS: 25.0000 mg | ORAL_TABLET | Freq: Two times a day (BID) | ORAL | 1 refills | Status: DC

## 2023-09-02 MED ORDER — SIMVASTATIN 20 MG PO TABS: 20.0000 mg | ORAL_TABLET | Freq: Every day | ORAL | 1 refills | Status: DC

## 2023-09-02 MED ORDER — GLIMEPIRIDE 1 MG PO TABS
ORAL_TABLET | ORAL | 1 refills | Status: DC
Start: 2023-09-02 — End: 2024-03-11

## 2023-09-02 MED ORDER — METFORMIN HCL 1000 MG PO TABS: ORAL_TABLET | ORAL | 1 refills | Status: DC

## 2023-09-02 MED ORDER — LISINOPRIL 40 MG PO TABS: 40.0000 mg | ORAL_TABLET | Freq: Every day | ORAL | 3 refills | Status: DC

## 2023-09-02 MED ORDER — AMLODIPINE BESYLATE 5 MG PO TABS: 5.0000 mg | ORAL_TABLET | Freq: Every day | ORAL | 0 refills | Status: DC

## 2023-09-02 NOTE — Assessment & Plan Note (Signed)
 Fair control on claritin and flonase . Continue same.

## 2023-09-02 NOTE — Assessment & Plan Note (Signed)
 Maintained on metformin .   Lab Results  Component Value Date   HGBA1C 6.8 (H) 04/28/2023   HGBA1C 7.1 (H) 12/22/2022   HGBA1C 7.9 (H) 09/16/2022   Lab Results  Component Value Date   MICROALBUR 32.9 (H) 04/28/2023   LDLCALC 40 12/22/2022   CREATININE 0.90 12/22/2022

## 2023-09-02 NOTE — Assessment & Plan Note (Signed)
 Lab Results  Component Value Date   CHOL 93 12/22/2022   HDL 36.00 (L) 12/22/2022   LDLCALC 40 12/22/2022   LDLDIRECT 65.0 11/19/2021   TRIG 87.0 12/22/2022   CHOLHDL 3 12/22/2022  At goal, continue simvastatin .

## 2023-09-02 NOTE — Assessment & Plan Note (Signed)
 Reports breathing stable, has not needing to use albuterol .

## 2023-09-02 NOTE — Patient Instructions (Signed)
 VISIT SUMMARY:  During today's visit, we reviewed your medications and discussed your recent change in insurance, which has affected your medication coverage. We addressed your diabetes management, hypertension, hyperlipidemia, and interest in testosterone  replacement therapy. We also discussed your chronic sinus issue and reviewed your general health maintenance, including cancer screenings and a foot exam.  YOUR PLAN:  -TYPE 2 DIABETES MELLITUS: Type 2 Diabetes Mellitus is a condition where your body does not use insulin properly, leading to high blood sugar levels. We will check your A1c today, and if it is above 7, we will consider restarting Januvia , which you reported as effective in controlling your blood sugar and aiding in weight loss.  -HYPERTENSION: Hypertension, or high blood pressure, is a condition where the force of the blood against your artery walls is too high. We will add Amlodipine  5mg  daily to your current regimen and recheck your blood pressure in 1 month.  -HYPERLIPIDEMIA: Hyperlipidemia is a condition with high levels of fats (lipids) in your blood. Your last cholesterol check was good with an LDL of 40, so we will continue your current medication, Simvastatin .  -TESTOSTERONE  DEFICIENCY: Testosterone  deficiency occurs when your body does not produce enough testosterone . We will check your testosterone  level today and consider restarting testosterone  replacement therapy based on the lab results and your insurance coverage.  -COLON CANCER SCREENING: Colon cancer screening is a preventive measure to detect early signs of colon cancer. We will place a fresh referral for a colonoscopy and provide you with the number to call and schedule the appointment.  -LUNG CANCER SCREENING: Lung cancer screening is a preventive measure to detect early signs of lung cancer. Since you declined the lung cancer screening CT scan, no further action is needed at this time.  -FOOT EXAM: A foot exam  is important for detecting any issues, especially in diabetic patients. Your foot exam showed normal sensation to monofilament testing, so no further action is needed at this time.  INSTRUCTIONS:  Please follow up in 1 month. Additionally, schedule your colonoscopy using the referral number provided.

## 2023-09-02 NOTE — Assessment & Plan Note (Signed)
 Uncontrolled. Continue lisinopril , add amlodipine  5mg .

## 2023-09-02 NOTE — Progress Notes (Signed)
 Subjective:     Patient ID: Phillip Doyle, male    DOB: 06-27-1956, 68 y.o.   MRN: [id number]  Chief Complaint  Patient presents with   Hypertension    Here for follow up   Diabetes    Here for follow up    Hypertension  Diabetes    Discussed the use of AI scribe software for clinical note transcription with the patient, who gave verbal consent to proceed.  History of Present Illness    The patient, with a history of diabetes, hyperlipidemia, and hypertension, presents for a medication review and management. He reports a recent change in insurance from [employer] to [employer], which has affected his medication coverage. He was previously on a GLP-1 agonist injection for diabetes, but due to insurance issues, he was unable to continue it. He expresses a desire to return to Januvia , a DPP-4 inhibitor, which he reports was effective in controlling his blood sugars and aiding in weight loss. He also reports recent blood glucose readings ranging from 168 to 190.  The patient is also on simvastatin  for hyperlipidemia, which appears to be well controlled with a recent LDL of 40. He expresses interest in testosterone  replacement therapy, which he had been on previously but was not covered by his previous insurance. He reports feeling better while on the therapy.  The patient also has hypertension, which he reports has been running higher recently, with readings between 138/70 to 148/75. He attributes this to a current sinus issue he is experiencing. He is currently on an lisinopril , which he reports that he is tolerating.  The patient also reports a chronic sinus issue, which he has been managing with a nasal rinse, Claritin, and Flonase . He also takes a coricidin prn.         BP Readings from Last 3 Encounters:  09/02/23 (!) 148/75  04/28/23 (!) 160/72  12/22/22 132/80        Past Medical History:  Diagnosis Date   Alcohol abuse 11/28/2013   ALLERGIC RHINITIS 02/15/2007    Allergy    rhinitis   Bronchitis 07/14/2012   Diabetes mellitus    DM 10/24/2010   ED (erectile dysfunction) 02/17/2012   EXOGENOUS OBESITY 02/15/2007   H/O alcohol abuse 11/28/2013   H/O tobacco use, presenting hazards to health 07/04/2010   Qualifier: Diagnosis of  By: Rodrick Clapper MD, Ammon Bales  1/2 ppd    HTN (hypertension) 08/20/2011   Hypertension    HYPERTENSION 02/15/2007   Impaired glucose tolerance    Low testosterone  in male 08/01/2016   Low testosterone  level in male 08/01/2016   LUMBAR STRAIN 10/17/2010   Mixed hyperlipidemia 07/04/2010   NISSEN FUNDOPLICATION, HX OF 02/15/2007   OA (osteoarthritis) of knee 02/17/2012   Obesity    PEPTIC ULCER DISEASE 02/15/2007   Personal history of colonic polyps 01/17/2013   colonoscoppy 2013, benign polyp advised repeat in 10 years   Preventative health care 07/19/2012   Restless sleeper 10/30/2016   TOBACCO ABUSE 07/04/2010   Ulcer 1982   peptic ulcer disease    Past Surgical History:  Procedure Laterality Date   COLONOSCOPY  03/2012   Several hyperplastic polyps   fundiplication for [facility] and reflux  1987   negative stress test  2008   ROTATOR CUFF REPAIR  2000   VASECTOMY  1982    Family History  Problem Relation Age of Onset   Cancer Mother        uterine  Asthma Mother    Alcohol abuse Father    Cancer Sister        Brain tumor   Hypertension Brother    Diabetes Brother    Heart disease Brother        aortic valve replaced, CAD   Cancer Brother        prostate   Stroke Maternal Grandmother        possibly   Hypertension Maternal Grandfather    Colon cancer Neg Hx     Social History   Socioeconomic History   Marital status: Married    Spouse name: Demetrios Finders   Number of children: Not on file   Years of education: Not on file   Highest education level: 12th grade  Occupational History    Employer: [employer]  Tobacco Use   Smoking status: Former    Current packs/day: 0.00    Average packs/day: 2.0 packs/day for 40.0  years (80.0 ttl pk-yrs)    Types: Cigarettes    Start date: 06/28/1974    Quit date: 06/28/2014    Years since quitting: 9.1   Smokeless tobacco: Former    Quit date: 03/18/2012   Tobacco comments:    quit 06/18/10- started back   Vaping Use   Vaping status: Not on file  Substance and Sexual Activity   Alcohol use: Yes    Alcohol/week: 3.0 - 4.0 standard drinks of alcohol    Types: 3 - 4 Cans of beer per week    Comment: 3-4 every other day   Drug use: No   Sexual activity: Yes    Partners: Female  Other Topics Concern   Not on file  Social History Narrative   Not on file   Social Drivers of Health   Financial Resource Strain: Low Risk  (04/22/2023)   Overall Financial Resource Strain (CARDIA)    Difficulty of Paying Living Expenses: Not hard at all  Food Insecurity: No Food Insecurity (04/22/2023)   Hunger Vital Sign    Worried About Running Out of Food in the Last Year: Never true    Ran Out of Food in the Last Year: Never true  Transportation Needs: No Transportation Needs (04/22/2023)   PRAPARE - Administrator, Civil Service (Medical): No    Lack of Transportation (Non-Medical): No  Physical Activity: Insufficiently Active (04/22/2023)   Exercise Vital Sign    Days of Exercise per Week: 3 days    Minutes of Exercise per Session: 30 min  Stress: No Stress Concern Present (04/22/2023)   [employer] of Occupational Health - Occupational Stress Questionnaire    Feeling of Stress : Not at all  Social Connections: Socially Isolated (04/22/2023)   Social Connection and Isolation Panel [NHANES]    Frequency of Communication with Friends and Family: Once a week    Frequency of Social Gatherings with Friends and Family: Once a week    Attends Religious Services: Never    Database administrator or Organizations: No    Attends Engineer, structural: Not on file    Marital Status: Married  Catering manager Violence: Not on file    Outpatient Medications Prior  to Visit  Medication Sig Dispense Refill   acetaminophen  (TYLENOL ) 650 MG CR tablet Take 1,300 mg by mouth 2 (two) times daily as needed for pain.     albuterol  (VENTOLIN  HFA) 108 (90 Base) MCG/ACT inhaler Inhale 2 puffs into the lungs every 6 (six) hours as needed for  wheezing or shortness of breath. 18 each 12   glucose blood (FREESTYLE LITE) test strip CHECK BLOOD SUGAR TWICE DAILY. DX: E11.9**VERIFY PATIENTS METER** 100 strip 0   Lancets (FREESTYLE) lancets Check blood sugar twice daily.  DX E11.9 100 each 6   Multiple Vitamins-Minerals (PA MENS 50 PLUS VITAPAK PO) Take 1 capsule by mouth daily.     Oral Electrolytes (H-E-B ORAL ELECTROLYTE PO) Take by mouth.     carvedilol  (COREG ) 25 MG tablet Take 1 tablet (25 mg total) by mouth 2 (two) times daily with a meal. 60 tablet 3   glimepiride  (AMARYL ) 1 MG tablet TAKE 1 TABLET BY MOUTH EVERY DAY WITH BREAKFAST 90 tablet 1   lisinopril  (ZESTRIL ) 40 MG tablet Take 1 tablet (40 mg total) by mouth daily. 90 tablet 3   metFORMIN  (GLUCOPHAGE ) 1000 MG tablet TAKE 1 TABLET (1,000 MG TOTAL) BY MOUTH TWICE A DAY WITH FOOD 180 tablet 1   simvastatin  (ZOCOR ) 20 MG tablet Take 1 tablet (20 mg total) by mouth daily at 6 PM. 90 tablet 1   tirzepatide  (MOUNJARO ) 10 MG/0.5ML Pen Inject 10 mg into the skin once a week. (Patient not taking: Reported on 09/02/2023) 6 mL 0   No facility-administered medications prior to visit.    No Known Allergies  ROS See HPI    Objective:    Physical Exam Constitutional:      General: He is not in acute distress.    Appearance: He is well-developed.  HENT:     Head: Normocephalic and atraumatic.  Cardiovascular:     Rate and Rhythm: Normal rate and regular rhythm.     Heart sounds: No murmur heard. Pulmonary:     Effort: Pulmonary effort is normal. No respiratory distress.     Breath sounds: Normal breath sounds. No wheezing or rales.  Skin:    General: Skin is warm and dry.  Neurological:     Mental Status: He  is alert and oriented to person, place, and time.  Psychiatric:        Behavior: Behavior normal.        Thought Content: Thought content normal.    Diabetic Foot Exam - Simple   Simple Foot Form Visual Inspection See comments: Yes Sensation Testing Intact to touch and monofilament testing bilaterally: Yes Pulse Check Posterior Tibialis and Dorsalis pulse intact bilaterally: Yes Comments Hypertrophic toenails      BP (!) 148/75 Comment: this am at home  Pulse 84   Temp 98.5 F (36.9 C) (Oral)   Resp 16   Ht 5\' 9"  (1.753 m)   Wt 247 lb (112 kg)   SpO2 94%   BMI 36.48 kg/m  Wt Readings from Last 3 Encounters:  09/02/23 247 lb (112 kg)  04/28/23 244 lb (110.7 kg)  12/22/22 238 lb 9.6 oz (108.2 kg)       Assessment & Plan:   Problem List Items Addressed This Visit       Unprioritized   Type 2 diabetes mellitus with obesity (HCC) - Primary   Maintained on metformin .   Lab Results  Component Value Date   HGBA1C 6.8 (H) 04/28/2023   HGBA1C 7.1 (H) 12/22/2022   HGBA1C 7.9 (H) 09/16/2022   Lab Results  Component Value Date   MICROALBUR 32.9 (H) 04/28/2023   LDLCALC 40 12/22/2022   CREATININE 0.90 12/22/2022         Relevant Medications   glimepiride  (AMARYL ) 1 MG tablet   metFORMIN  (GLUCOPHAGE ) 1000 MG  tablet   simvastatin  (ZOCOR ) 20 MG tablet   lisinopril  (ZESTRIL ) 40 MG tablet   Other Relevant Orders   Basic Metabolic Panel (BMET)   HgB A1c   Testosterone  deficiency in male   Relevant Orders   Testosterone  Total,Free,Bio, Males-([facility])   Mixed hyperlipidemia   Lab Results  Component Value Date   CHOL 93 12/22/2022   HDL 36.00 (L) 12/22/2022   LDLCALC 40 12/22/2022   LDLDIRECT 65.0 11/19/2021   TRIG 87.0 12/22/2022   CHOLHDL 3 12/22/2022  At goal, continue simvastatin .        Relevant Medications   carvedilol  (COREG ) 25 MG tablet   simvastatin  (ZOCOR ) 20 MG tablet   lisinopril  (ZESTRIL ) 40 MG tablet   amLODipine  (NORVASC ) 5 MG tablet    HTN (hypertension)   Uncontrolled. Continue lisinopril , add amlodipine  5mg .       Relevant Medications   carvedilol  (COREG ) 25 MG tablet   simvastatin  (ZOCOR ) 20 MG tablet   lisinopril  (ZESTRIL ) 40 MG tablet   amLODipine  (NORVASC ) 5 MG tablet   COPD mixed type (HCC)   Reports breathing stable, has not needing to use albuterol .        Allergic rhinitis   Fair control on claritin and flonase . Continue same.       Other Visit Diagnoses       Screening for colon cancer       Relevant Orders   Ambulatory referral to Gastroenterology       I have discontinued Donavon Fudge A. Cheatum's tirzepatide . I am also having him start on amLODipine . Additionally, I am having him maintain his Multiple Vitamins-Minerals (PA MENS 50 PLUS VITAPAK PO), acetaminophen , Oral Electrolytes (H-E-B ORAL ELECTROLYTE PO), albuterol , FREESTYLE LITE, freestyle, carvedilol , glimepiride , metFORMIN , simvastatin , and lisinopril .  Meds ordered this encounter  Medications   carvedilol  (COREG ) 25 MG tablet    Sig: Take 1 tablet (25 mg total) by mouth 2 (two) times daily with a meal.    Dispense:  180 tablet    Refill:  1   glimepiride  (AMARYL ) 1 MG tablet    Sig: TAKE 1 TABLET BY MOUTH EVERY DAY WITH BREAKFAST    Dispense:  90 tablet    Refill:  1   metFORMIN  (GLUCOPHAGE ) 1000 MG tablet    Sig: TAKE 1 TABLET (1,000 MG TOTAL) BY MOUTH TWICE A DAY WITH FOOD    Dispense:  180 tablet    Refill:  1   simvastatin  (ZOCOR ) 20 MG tablet    Sig: Take 1 tablet (20 mg total) by mouth daily at 6 PM.    Dispense:  90 tablet    Refill:  1   lisinopril  (ZESTRIL ) 40 MG tablet    Sig: Take 1 tablet (40 mg total) by mouth daily.    Dispense:  90 tablet    Refill:  3   amLODipine  (NORVASC ) 5 MG tablet    Sig: Take 1 tablet (5 mg total) by mouth daily.    Dispense:  90 tablet    Refill:  0    Supervising Provider:   Randie Bustle A [[id number]]

## 2023-09-03 ENCOUNTER — Telehealth: Payer: Self-pay | Admitting: Family

## 2023-09-03 DIAGNOSIS — E291 Testicular hypofunction: Secondary | ICD-10-CM

## 2023-09-03 LAB — TESTOSTERONE TOTAL,FREE,BIO, MALES
Albumin: 4.4 g/dL (ref 3.6–5.1)
Sex Hormone Binding: 26 nmol/L (ref 22–77)
Testosterone: 211 ng/dL — ABNORMAL LOW (ref 250–827)

## 2023-09-03 LAB — BASIC METABOLIC PANEL
BUN: 14 mg/dL (ref 6–23)
CO2: 28 meq/L (ref 19–32)
Calcium: 9.7 mg/dL (ref 8.4–10.5)
Chloride: 93 meq/L — ABNORMAL LOW (ref 96–112)
Creatinine, Ser: 0.77 mg/dL (ref 0.40–1.50)
GFR: 93.46 mL/min (ref >60.00)
Glucose, Bld: 201 mg/dL — ABNORMAL HIGH (ref 70–99)
Potassium: 4.2 meq/L (ref 3.5–5.1)
Sodium: 132 meq/L — ABNORMAL LOW (ref 135–145)

## 2023-09-03 LAB — HEMOGLOBIN A1C: Hgb A1c MFr Bld: 8.1 % — ABNORMAL HIGH (ref 4.6–6.5)

## 2023-09-03 MED ORDER — SITAGLIPTIN PHOSPHATE 50 MG PO TABS: 50.0000 mg | ORAL_TABLET | Freq: Every day | ORAL | 1 refills | Status: DC

## 2023-09-03 NOTE — Telephone Encounter (Signed)
Patient notified of results, new medications, recommendations and referral

## 2023-09-03 NOTE — Telephone Encounter (Signed)
A1C has risen from 6.8 to 8.1.  Continue metformin. Add [place] 50mg  once daily and work on [employer], exercise and weight loss.  Testosterone is low. I would like to refer him to urology upstairs to help with his testosterone management.

## 2023-09-23 ENCOUNTER — Encounter: Payer: Self-pay | Admitting: Urology

## 2023-09-23 ENCOUNTER — Other Ambulatory Visit: Payer: Self-pay | Admitting: Family Medicine

## 2023-09-23 ENCOUNTER — Ambulatory Visit: Payer: 59 | Admitting: Urology

## 2023-09-23 VITALS — BP 178/77 | HR 85 | Ht 69.0 in | Wt 248.0 lb

## 2023-09-23 DIAGNOSIS — R7989 Other specified abnormal findings of blood chemistry: Secondary | ICD-10-CM

## 2023-09-23 NOTE — Progress Notes (Signed)
 Assessment: 1. Low testosterone  in male      Plan: Today had a long discussion with the patient regarding his low testosterone  and options for further evaluation.  As noted his most recent testosterone  studies which was an afternoon level was associated with a normal bioavailable level for his age.  I discussed with him further evaluation and assessment of the hypothalamic, pituitary, and testicular axis and further recommendations based on findings.  Options for TRT as well as potential adverse events and side effects were also reviewed.  Following our discussion the patient states that he does not want to pursue further evaluation or treatment at this time.  Chief Complaint: low T  History of Present Illness:  Phillip Doyle is a 68 y.o. male who is seen in consultation from Domenica Harlene DELENA, MD for evaluation of low testosterone . Patient c/o long standing low energy levels. Patient has had frequent testosterone  levels checked over the last several years which have all been low.  The highest level is 230 the lowest 150.  Some of these were early morning draws.  The patient was actually prescribed Testim  but was unable to obtain- declined by insurance due to inadequate deficiency.  Recent total testosterone  211, SHBG= 26 with bioavailable calculation = 116 which is within the normal range for his age group.  Past Medical History:  Past Medical History:  Diagnosis Date   Alcohol abuse 11/28/2013   ALLERGIC RHINITIS 02/15/2007   Allergy    rhinitis   Bronchitis 07/14/2012   Diabetes mellitus    DM 10/24/2010   ED (erectile dysfunction) 02/17/2012   EXOGENOUS OBESITY 02/15/2007   H/O alcohol abuse 11/28/2013   H/O tobacco use, presenting hazards to health 07/04/2010   Qualifier: Diagnosis of  By: Domenica MD, Harlene  1/2 ppd    HTN (hypertension) 08/20/2011   Hypertension    HYPERTENSION 02/15/2007   Impaired glucose tolerance    Low testosterone  in male 08/01/2016   Low testosterone  level in  male 08/01/2016   LUMBAR STRAIN 10/17/2010   Mixed hyperlipidemia 07/04/2010   NISSEN FUNDOPLICATION, HX OF 02/15/2007   OA (osteoarthritis) of knee 02/17/2012   Obesity    PEPTIC ULCER DISEASE 02/15/2007   Personal history of colonic polyps 01/17/2013   colonoscoppy 2013, benign polyp advised repeat in 10 years   Preventative health care 07/19/2012   Restless sleeper 10/30/2016   TOBACCO ABUSE 07/04/2010   Ulcer 1982   peptic ulcer disease    Past Surgical History:  Past Surgical History:  Procedure Laterality Date   COLONOSCOPY  03/2012   Several hyperplastic polyps   fundiplication for [facility] and reflux  1987   negative stress test  2008   ROTATOR CUFF REPAIR  2000   VASECTOMY  1982    Allergies:  No Known Allergies  Family History:  Family History  Problem Relation Age of Onset   Cancer Mother        uterine    Asthma Mother    Alcohol abuse Father    Cancer Sister        Brain tumor   Hypertension Brother    Diabetes Brother    Heart disease Brother        aortic valve replaced, CAD   Cancer Brother        prostate   Stroke Maternal Grandmother        possibly   Hypertension Maternal Grandfather    Colon cancer Neg Hx     Social  History:  Social History   Tobacco Use   Smoking status: Former    Current packs/day: 0.00    Average packs/day: 2.0 packs/day for 40.0 years (80.0 ttl pk-yrs)    Types: Cigarettes    Start date: 06/28/1974    Quit date: 06/28/2014    Years since quitting: 9.2   Smokeless tobacco: Former    Quit date: 03/18/2012   Tobacco comments:    quit 06/18/10- started back   Substance Use Topics   Alcohol use: Yes    Alcohol/week: 3.0 - 4.0 standard drinks of alcohol    Types: 3 - 4 Cans of beer per week    Comment: 3-4 every other day   Drug use: No    Review of symptoms:  Constitutional:  Negative for unexplained weight loss, night sweats, fever, chills ENT:  Negative for nose bleeds, sinus pain, painful swallowing CV:  Negative for  chest pain, shortness of breath, exercise intolerance, palpitations, loss of consciousness Resp:  Negative for cough, wheezing, shortness of breath GI:  Negative for nausea, vomiting, diarrhea, bloody stools GU:  Positives noted in HPI; otherwise negative for gross hematuria, dysuria, urinary incontinence Neuro:  Negative for seizures, poor balance, limb weakness, slurred speech Psych:  Negative for lack of energy, depression, anxiety Endocrine:  Negative for polydipsia, polyuria, symptoms of hypoglycemia (dizziness, hunger, sweating) Hematologic:  Negative for anemia, purpura, petechia, prolonged or excessive bleeding, use of anticoagulants  Allergic:  Negative for difficulty breathing or choking as a result of exposure to anything; no shellfish allergy; no allergic response (rash/itch) to materials, foods  Physical exam: Vitals:   09/23/23 0956  BP: (!) 178/77  Pulse: 85    GENERAL APPEARANCE:  Well appearing, well developed, well nourished, NAD

## 2023-10-07 ENCOUNTER — Ambulatory Visit: Payer: 59 | Admitting: Family

## 2023-12-01 ENCOUNTER — Other Ambulatory Visit: Payer: Self-pay | Admitting: Family Medicine

## 2023-12-01 ENCOUNTER — Encounter: Payer: Self-pay | Admitting: Family Medicine

## 2023-12-01 ENCOUNTER — Other Ambulatory Visit: Payer: Self-pay | Admitting: Family

## 2023-12-01 DIAGNOSIS — I10 Essential (primary) hypertension: Secondary | ICD-10-CM

## 2023-12-01 DIAGNOSIS — J449 Chronic obstructive pulmonary disease, unspecified: Secondary | ICD-10-CM

## 2023-12-01 MED ORDER — ALBUTEROL SULFATE HFA 108 (90 BASE) MCG/ACT IN AERS: 2.0000 | INHALATION_SPRAY | Freq: Four times a day (QID) | RESPIRATORY_TRACT | 12 refills | Status: AC | PRN

## 2024-02-17 ENCOUNTER — Other Ambulatory Visit: Payer: Self-pay | Admitting: Family

## 2024-02-17 ENCOUNTER — Encounter: Payer: Self-pay | Admitting: Family

## 2024-02-17 DIAGNOSIS — I10 Essential (primary) hypertension: Secondary | ICD-10-CM

## 2024-02-27 ENCOUNTER — Other Ambulatory Visit: Payer: Self-pay | Admitting: Family

## 2024-02-28 NOTE — Telephone Encounter (Signed)
 Please contact pt to schedule follow up.

## 2024-03-11 ENCOUNTER — Other Ambulatory Visit: Payer: Self-pay | Admitting: Family

## 2024-03-11 DIAGNOSIS — E1169 Type 2 diabetes mellitus with other specified complication: Secondary | ICD-10-CM

## 2024-03-15 ENCOUNTER — Other Ambulatory Visit: Payer: Self-pay | Admitting: Family

## 2024-03-15 DIAGNOSIS — E1169 Type 2 diabetes mellitus with other specified complication: Secondary | ICD-10-CM

## 2024-03-15 NOTE — Telephone Encounter (Signed)
 Please call pt to schedule a follow up visit with Dr. Domenica or myself.

## 2024-03-16 NOTE — Telephone Encounter (Signed)
 Pt scheduled

## 2024-03-23 ENCOUNTER — Encounter: Payer: Self-pay | Admitting: Family

## 2024-03-30 ENCOUNTER — Ambulatory Visit (INDEPENDENT_AMBULATORY_CARE_PROVIDER_SITE_OTHER): Admitting: Family

## 2024-03-30 VITALS — BP 154/68 | HR 90 | Temp 99.0°F | Resp 16 | Ht 69.0 in | Wt 252.0 lb

## 2024-03-30 DIAGNOSIS — E669 Obesity, unspecified: Secondary | ICD-10-CM

## 2024-03-30 DIAGNOSIS — M2550 Pain in unspecified joint: Secondary | ICD-10-CM | POA: Diagnosis not present

## 2024-03-30 DIAGNOSIS — E1169 Type 2 diabetes mellitus with other specified complication: Secondary | ICD-10-CM

## 2024-03-30 DIAGNOSIS — Z7984 Long term (current) use of oral hypoglycemic drugs: Secondary | ICD-10-CM

## 2024-03-30 DIAGNOSIS — E782 Mixed hyperlipidemia: Secondary | ICD-10-CM

## 2024-03-30 DIAGNOSIS — I10 Essential (primary) hypertension: Secondary | ICD-10-CM | POA: Diagnosis not present

## 2024-03-30 DIAGNOSIS — J45909 Unspecified asthma, uncomplicated: Secondary | ICD-10-CM

## 2024-03-30 LAB — LIPID PANEL
Cholesterol: 123 mg/dL (ref 0–200)
HDL: 42.7 mg/dL (ref >39.00)
LDL Cholesterol: 49 mg/dL (ref 0–99)
NonHDL: 80.71
Total CHOL/HDL Ratio: 3
Triglycerides: 158 mg/dL — ABNORMAL HIGH (ref 0.0–149.0)
VLDL: 31.6 mg/dL (ref 0.0–40.0)

## 2024-03-30 LAB — SEDIMENTATION RATE: Sed Rate: 10 mm/h (ref 0–20)

## 2024-03-30 LAB — MICROALBUMIN / CREATININE URINE RATIO
Creatinine,U: 47.8 mg/dL
Microalb Creat Ratio: 312.4 mg/g — ABNORMAL HIGH (ref 0.0–30.0)
Microalb, Ur: 14.9 mg/dL — ABNORMAL HIGH (ref 0.0–1.9)

## 2024-03-30 LAB — HEMOGLOBIN A1C: Hgb A1c MFr Bld: 7.4 % — ABNORMAL HIGH (ref 4.6–6.5)

## 2024-03-30 MED ORDER — SITAGLIPTIN PHOSPHATE 50 MG PO TABS: 50.0000 mg | ORAL_TABLET | Freq: Every day | ORAL | 1 refills | Status: DC

## 2024-03-30 MED ORDER — SIMVASTATIN 20 MG PO TABS: 20.0000 mg | ORAL_TABLET | Freq: Every day | ORAL | 1 refills | Status: AC

## 2024-03-30 MED ORDER — METFORMIN HCL 1000 MG PO TABS: ORAL_TABLET | ORAL | 1 refills | Status: AC

## 2024-03-30 MED ORDER — MELOXICAM 7.5 MG PO TABS: 7.5000 mg | ORAL_TABLET | Freq: Every day | ORAL | 0 refills | Status: DC

## 2024-03-30 MED ORDER — CARVEDILOL 25 MG PO TABS: 25.0000 mg | ORAL_TABLET | Freq: Two times a day (BID) | ORAL | 0 refills | Status: DC

## 2024-03-30 MED ORDER — AMLODIPINE BESYLATE 10 MG PO TABS
10.0000 mg | ORAL_TABLET | Freq: Every day | ORAL | 0 refills | Status: DC
Start: 2024-03-30 — End: 2024-06-12

## 2024-03-30 MED ORDER — GLIMEPIRIDE 1 MG PO TABS: 1.0000 mg | ORAL_TABLET | Freq: Every day | ORAL | 1 refills | Status: AC

## 2024-03-30 NOTE — Assessment & Plan Note (Signed)
 BP Readings from Last 3 Encounters:  03/30/24 (!) 154/68  09/23/23 (!) 178/77  09/02/23 (!) 148/75   Uncontrolled. Increase amlodipine  from 5mg  to 10mg .

## 2024-03-30 NOTE — Progress Notes (Signed)
 Subjective:     Patient ID: Phillip Doyle, male    DOB: 25-Aug-1955, 68 y.o.   MRN: [id number]  Chief Complaint  Patient presents with   Diabetes    Here for follow up   Hypertension    Here for follow up    HPI  Discussed the use of A0I scribe software for clinical note transcription with the patient, who gave verbal consent to proceed.  History of Present Illness  Phillip Doyle is a 68 year old male with hypertension who presents for a routine follow-up and reports joint pain.  He experiences joint pain in his knees and left hip, described as 'compressing' and affecting multiple joints. He believes that amlodipine  and carvedilol  are the cause of his joint pain.  The pain is severe and impacts daily activities. He is concerned it is atypical for arthritis. He has been on carvedilol  for almost a year and amlodipine  for a few months.  His home blood pressure readings are around 140 mmHg. He takes carvedilol , lisinopril , and amlodipine  5 mg daily. He is also on simvastatin  20 mg for cholesterol management.  He has a history of low testosterone  levels but states it is not affecting his daily activities. He saw Urology about this. His asthma has not been problematic since the pollen season ended. He has a new inhaler and an additional one at home. He has completed the shingles vaccination series. He takes simvastatin , Januvia , and metformin  for his medical conditions and has an ample supply of lancets and strips from [facility].     Health Maintenance Due  Topic Date Due   Diabetic kidney evaluation - Urine ACR  Never done   Colonoscopy  04/12/2022   COVID-19 Vaccine (5 - 2024-25 season) 04/19/2023   Zoster Vaccines- Shingrix (2 of 2) 05/24/2023   HEMOGLOBIN A1C  03/01/2024   INFLUENZA VACCINE  03/18/2024    Past Medical History:  Diagnosis Date   Alcohol abuse 11/28/2013   ALLERGIC RHINITIS 02/15/2007   Allergy    rhinitis   Bronchitis 07/14/2012   Diabetes mellitus    DM 10/24/2010    ED (erectile dysfunction) 02/17/2012   EXOGENOUS OBESITY 02/15/2007   H/O alcohol abuse 11/28/2013   H/O tobacco use, presenting hazards to health 07/04/2010   Qualifier: Diagnosis of  By: Domenica MD, Harlene  1/2 ppd    HTN (hypertension) 08/20/2011   Hypertension    HYPERTENSION 02/15/2007   Impaired glucose tolerance    Low testosterone  in male 08/01/2016   Low testosterone  level in male 08/01/2016   LUMBAR STRAIN 10/17/2010   Mixed hyperlipidemia 07/04/2010   NISSEN FUNDOPLICATION, HX OF 02/15/2007   OA (osteoarthritis) of knee 02/17/2012   Obesity    PEPTIC ULCER DISEASE 02/15/2007   Personal history of colonic polyps 01/17/2013   colonoscoppy 2013, benign polyp advised repeat in 10 years   Preventative health care 07/19/2012   Restless sleeper 10/30/2016   TOBACCO ABUSE 07/04/2010   Ulcer 1982   peptic ulcer disease    Past Surgical History:  Procedure Laterality Date   COLONOSCOPY  03/2012   Several hyperplastic polyps   fundiplication for [facility] and reflux  1987   negative stress test  2008   ROTATOR CUFF REPAIR  2000   VASECTOMY  1982    Family History  Problem Relation Age of Onset   Cancer Mother        uterine    Asthma Mother    Alcohol abuse Father  Cancer Sister        Brain tumor   Hypertension Brother    Diabetes Brother    Heart disease Brother        aortic valve replaced, CAD   Cancer Brother        prostate   Stroke Maternal Grandmother        possibly   Hypertension Maternal Grandfather    Colon cancer Neg Hx     Social History   Socioeconomic History   Marital status: Married    Spouse name: Stacey   Number of children: Not on file   Years of education: Not on file   Highest education level: 12th grade  Occupational History    Employer: [employer]  Tobacco Use   Smoking status: Former    Current packs/day: 0.00    Average packs/day: 2.0 packs/day for 40.0 years (80.0 ttl pk-yrs)    Types: Cigarettes    Start date: 06/28/1974    Quit date:  06/28/2014    Years since quitting: 9.7   Smokeless tobacco: Former    Quit date: 03/18/2012   Tobacco comments:    quit 06/18/10- started back   Vaping Use   Vaping status: Not on file  Substance and Sexual Activity   Alcohol use: Yes    Alcohol/week: 3.0 - 4.0 standard drinks of alcohol    Types: 3 - 4 Cans of beer per week    Comment: 3-4 every other day   Drug use: No   Sexual activity: Yes    Partners: Female  Other Topics Concern   Not on file  Social History Narrative   Not on file   Social Drivers of Health   Financial Resource Strain: Low Risk  (03/23/2024)   Overall Financial Resource Strain (CARDIA)    Difficulty of Paying Living Expenses: Not hard at all  Food Insecurity: No Food Insecurity (03/23/2024)   Hunger Vital Sign    Worried About Running Out of Food in the Last Year: Never true    Ran Out of Food in the Last Year: Never true  Transportation Needs: No Transportation Needs (03/23/2024)   PRAPARE - Administrator, Civil Service (Medical): No    Lack of Transportation (Non-Medical): No  Physical Activity: Insufficiently Active (03/23/2024)   Exercise Vital Sign    Days of Exercise per Week: 3 days    Minutes of Exercise per Session: 20 min  Stress: No Stress Concern Present (03/23/2024)   [employer] of Occupational Health - Occupational Stress Questionnaire    Feeling of Stress: Not at all  Social Connections: Moderately Isolated (03/23/2024)   Social Connection and Isolation Panel    Frequency of Communication with Friends and Family: More than three times a week    Frequency of Social Gatherings with Friends and Family: More than three times a week    Attends Religious Services: Never    Database administrator or Organizations: No    Attends Engineer, structural: Not on file    Marital Status: Married  Catering manager Violence: Not on file    Outpatient Medications Prior to Visit  Medication Sig Dispense Refill   acetaminophen   (TYLENOL ) 650 MG CR tablet Take 1,300 mg by mouth 2 (two) times daily as needed for pain.     albuterol  (VENTOLIN  HFA) 108 (90 Base) MCG/ACT inhaler Inhale 2 puffs into the lungs every 6 (six) hours as needed for wheezing or shortness of breath. 18 each  12   glucose blood (FREESTYLE LITE) test strip CHECK BLOOD SUGAR TWICE DAILY. DX: E11.9**VERIFY PATIENTS METER** 100 strip 0   Lancets (FREESTYLE) lancets Check blood sugar twice daily.  DX E11.9 100 each 6   lisinopril  (ZESTRIL ) 40 MG tablet Take 1 tablet (40 mg total) by mouth daily. 90 tablet 3   Multiple Vitamins-Minerals (PA MENS 50 PLUS VITAPAK PO) Take 1 capsule by mouth daily.     Oral Electrolytes (H-E-B ORAL ELECTROLYTE PO) Take by mouth.     amLODipine  (NORVASC ) 5 MG tablet Take 1 tablet by mouth once daily 90 tablet 0   carvedilol  (COREG ) 25 MG tablet TAKE 1 TABLET BY MOUTH TWICE DAILY WITH A MEAL 180 tablet 0   glimepiride  (AMARYL ) 1 MG tablet Take 1 tablet by mouth once daily with breakfast 90 tablet 0   metFORMIN  (GLUCOPHAGE ) 1000 MG tablet TAKE 1 TABLET (1,000 MG TOTAL) BY MOUTH TWICE A DAY WITH FOOD 180 tablet 1   simvastatin  (ZOCOR ) 20 MG tablet Take 1 tablet (20 mg total) by mouth daily at 6 PM. 90 tablet 1   sitaGLIPtin  (JANUVIA ) 50 MG tablet Take 1 tablet by mouth once daily 30 tablet 0   No facility-administered medications prior to visit.    No Known Allergies  ROS See HPI    Objective:    Physical Exam Constitutional:      General: He is not in acute distress.    Appearance: He is well-developed.  HENT:     Head: Normocephalic and atraumatic.  Cardiovascular:     Rate and Rhythm: Normal rate and regular rhythm.     Heart sounds: No murmur heard. Pulmonary:     Effort: Pulmonary effort is normal. No respiratory distress.     Breath sounds: Normal breath sounds. No wheezing or rales.  Skin:    General: Skin is warm and dry.  Neurological:     Mental Status: He is alert and oriented to person, place, and  time.  Psychiatric:        Behavior: Behavior normal.        Thought Content: Thought content normal.      BP (!) 154/68 (BP Location: Left Arm, Patient Position: Sitting, Cuff Size: Large)   Pulse 90   Temp 99 F (37.2 C) (Oral)   Resp 16   Ht 5' 9 (1.753 m)   Wt 252 lb (114.3 kg)   SpO2 95%   BMI 37.21 kg/m  Wt Readings from Last 3 Encounters:  03/30/24 252 lb (114.3 kg)  09/23/23 248 lb (112.5 kg)  09/02/23 247 lb (112 kg)       Assessment & Plan:   Problem List Items Addressed This Visit       Unprioritized   Type 2 diabetes mellitus with obesity (HCC)   Lab Results  Component Value Date   HGBA1C 8.1 (H) 09/02/2023   HGBA1C 6.8 (H) 04/28/2023   HGBA1C 7.1 (H) 12/22/2022   Lab Results  Component Value Date   LDLCALC 40 12/22/2022   CREATININE 0.77 09/02/2023   Last A1C was elevated. We added Januvia . Will update A1C today.       Relevant Medications   metFORMIN  (GLUCOPHAGE ) 1000 MG tablet   glimepiride  (AMARYL ) 1 MG tablet   simvastatin  (ZOCOR ) 20 MG tablet   sitaGLIPtin  (JANUVIA ) 50 MG tablet   Other Relevant Orders   HgB A1c   Urine Microalbumin w/creat. ratio   Mixed hyperlipidemia   Lab Results  Component Value Date  CHOL 93 12/22/2022   HDL 36.00 (L) 12/22/2022   LDLCALC 40 12/22/2022   LDLDIRECT 65.0 11/19/2021   TRIG 87.0 12/22/2022   CHOLHDL 3 12/22/2022   Due for update.  Maintained on simvastatin .       Relevant Medications   simvastatin  (ZOCOR ) 20 MG tablet   amLODipine  (NORVASC ) 10 MG tablet   carvedilol  (COREG ) 25 MG tablet   Other Relevant Orders   Lipid panel   HTN (hypertension)   BP Readings from Last 3 Encounters:  03/30/24 (!) 154/68  09/23/23 (!) 178/77  09/02/23 (!) 148/75   Uncontrolled. Increase amlodipine  from 5mg  to 10mg .       Relevant Medications   simvastatin  (ZOCOR ) 20 MG tablet   amLODipine  (NORVASC ) 10 MG tablet   carvedilol  (COREG ) 25 MG tablet   Asthma   Reports stable now that pollen is  improved.  Has albuterol  for prn use.      Arthralgia - Primary   Reviewed side effect profile of amlodipine  (<1 % reported arthralgia), Carvedilol  2-6% reported arthralgia.  He has been on carvedilol  for 1 year and joint pain just started. Advised pt that it is highly unlikely that either of these meds are the cause for his knee pain and left sided hip pain.  Will evaluate for underlying autoimmune etiology- but most likely cause is osteoarthritis. Recommend tylenol  prn mild pain, meloxicam  once daily prn for more severe pain.       Relevant Medications   meloxicam  (MOBIC ) 7.5 MG tablet   Other Relevant Orders   Antinuclear Antib (ANA)   Rheumatoid Factor   Sedimentation rate    I have discontinued Phillip Doyle's amLODipine . I have changed his Januvia  to sitaGLIPtin . I have also changed his glimepiride  and carvedilol . Additionally, I am having him start on meloxicam  and amLODipine . Lastly, I am having him maintain his Multiple Vitamins-Minerals (PA MENS 50 PLUS VITAPAK PO), acetaminophen , Oral Electrolytes (H-E-B ORAL ELECTROLYTE PO), FREESTYLE LITE, freestyle, lisinopril , albuterol , metFORMIN , and simvastatin .  Meds ordered this encounter  Medications   metFORMIN  (GLUCOPHAGE ) 1000 MG tablet    Sig: TAKE 1 TABLET (1,000 MG TOTAL) BY MOUTH TWICE A DAY WITH FOOD    Dispense:  180 tablet    Refill:  1   glimepiride  (AMARYL ) 1 MG tablet    Sig: Take 1 tablet (1 mg total) by mouth daily with breakfast.    Dispense:  90 tablet    Refill:  1   simvastatin  (ZOCOR ) 20 MG tablet    Sig: Take 1 tablet (20 mg total) by mouth daily at 6 PM.    Dispense:  90 tablet    Refill:  1   sitaGLIPtin  (JANUVIA ) 50 MG tablet    Sig: Take 1 tablet (50 mg total) by mouth daily.    Dispense:  90 tablet    Refill:  1   meloxicam  (MOBIC ) 7.5 MG tablet    Sig: Take 1 tablet (7.5 mg total) by mouth daily.    Dispense:  14 tablet    Refill:  0    Supervising Provider:   DOMENICA BLACKBIRD A [[id number]]   amLODipine   (NORVASC ) 10 MG tablet    Sig: Take 1 tablet (10 mg total) by mouth daily.    Dispense:  90 tablet    Refill:  0    Supervising Provider:   DOMENICA BLACKBIRD A [[id number]]   carvedilol  (COREG ) 25 MG tablet    Sig: Take 1 tablet (25 mg total) by mouth 2 (  two) times daily with a meal.    Dispense:  180 tablet    Refill:  0    Supervising Provider:   DOMENICA BLACKBIRD A [[id number]]

## 2024-03-30 NOTE — Patient Instructions (Addendum)
 VISIT SUMMARY:  During your visit, we discussed your joint pain, blood pressure management, and general health maintenance. We also reviewed your medications and made some adjustments to better manage your conditions.  YOUR PLAN:  OSTEOARTHRITIS WITH POLYARTICULAR JOINT PAIN: You have been experiencing severe joint pain in your knees and left hip, which is affecting your daily activities. -We prescribed meloxicam  once daily for your joint pain to be taken as needed. -You can also use acetaminophen  for mild pain. -We will order tests to check for autoimmune conditions like rheumatoid arthritis.  HYPERTENSION: Your blood pressure readings are around 140 mmHg at home, which is higher than desired. -We have increased your amlodipine  dose from 5 mg to 10 mg daily. -Please schedule a follow-up with the nurse in two weeks to recheck your blood pressure.  TYPE 2 DIABETES MELLITUS: You are currently managing your diabetes with metformin  and Januvia .  HYPERLIPIDEMIA: Your cholesterol levels were last checked a year ago, and you are taking simvastatin  20 mg. -We will order a cholesterol panel to check your current levels. -Ensure you have an adequate supply of simvastatin .  GENERAL HEALTH MAINTENANCE: We discussed your vaccinations and general health. -Encourage getting a flu vaccination in the fall.

## 2024-03-30 NOTE — Assessment & Plan Note (Signed)
 Lab Results  Component Value Date   CHOL 93 12/22/2022   HDL 36.00 (L) 12/22/2022   LDLCALC 40 12/22/2022   LDLDIRECT 65.0 11/19/2021   TRIG 87.0 12/22/2022   CHOLHDL 3 12/22/2022   Due for update.  Maintained on simvastatin .

## 2024-03-30 NOTE — Assessment & Plan Note (Signed)
 Lab Results  Component Value Date   HGBA1C 8.1 (H) 09/02/2023   HGBA1C 6.8 (H) 04/28/2023   HGBA1C 7.1 (H) 12/22/2022   Lab Results  Component Value Date   LDLCALC 40 12/22/2022   CREATININE 0.77 09/02/2023   Last A1C was elevated. We added Januvia . Will update A1C today.

## 2024-03-30 NOTE — Assessment & Plan Note (Signed)
 Reviewed side effect profile of amlodipine  (<1 % reported arthralgia), Carvedilol  2-6% reported arthralgia.  He has been on carvedilol  for 1 year and joint pain just started. Advised pt that it is highly unlikely that either of these meds are the cause for his knee pain and left sided hip pain.  Will evaluate for underlying autoimmune etiology- but most likely cause is osteoarthritis. Recommend tylenol  prn mild pain, meloxicam  once daily prn for more severe pain.

## 2024-03-30 NOTE — Assessment & Plan Note (Addendum)
 Reports stable now that pollen is improved.  Has albuterol  for prn use.

## 2024-04-01 LAB — ANA: Anti Nuclear Antibody (ANA): POSITIVE — AB

## 2024-04-01 LAB — ANTI-NUCLEAR AB-TITER (ANA TITER): ANA Titer 1: 1:160 {titer} — ABNORMAL HIGH

## 2024-04-01 LAB — RHEUMATOID FACTOR: Rheumatoid fact SerPl-aCnc: <10 [IU]/mL (ref <14)

## 2024-04-04 ENCOUNTER — Ambulatory Visit: Payer: Self-pay | Admitting: Family

## 2024-04-04 DIAGNOSIS — E1169 Type 2 diabetes mellitus with other specified complication: Secondary | ICD-10-CM

## 2024-04-04 DIAGNOSIS — R768 Other specified abnormal immunological findings in serum: Secondary | ICD-10-CM | POA: Insufficient documentation

## 2024-04-04 MED ORDER — SITAGLIPTIN PHOSPHATE 100 MG PO TABS: 100.0000 mg | ORAL_TABLET | Freq: Every day | ORAL | 1 refills | Status: DC

## 2024-04-04 NOTE — Telephone Encounter (Signed)
 Please contact pt and let him know that some of his autoimmune testing is elevated and may be contributing to his joint pain.  I would like to refer him to rheumatology for consultation.  A1C has come down from 8 to 7.4- improving. I would like to get him <7.  Please increase januvia  from 50 mg to 100 mg.

## 2024-04-06 ENCOUNTER — Ambulatory Visit

## 2024-06-12 ENCOUNTER — Other Ambulatory Visit: Payer: Self-pay | Admitting: Family

## 2024-06-12 DIAGNOSIS — I10 Essential (primary) hypertension: Secondary | ICD-10-CM

## 2024-07-01 ENCOUNTER — Ambulatory Visit (INDEPENDENT_AMBULATORY_CARE_PROVIDER_SITE_OTHER): Admitting: Student

## 2024-07-01 ENCOUNTER — Encounter: Payer: Self-pay | Admitting: Student

## 2024-07-01 ENCOUNTER — Ambulatory Visit: Admitting: Family

## 2024-07-01 ENCOUNTER — Ambulatory Visit (HOSPITAL_BASED_OUTPATIENT_CLINIC_OR_DEPARTMENT_OTHER)
Admission: RE | Admit: 2024-07-01 | Discharge: 2024-07-01 | Disposition: A | Discharge status: Home / Self Care | Source: Ambulatory Visit | Attending: Student | Admitting: Student

## 2024-07-01 VITALS — BP 152/77 | HR 78 | Temp 98.1°F | Resp 16 | Ht 69.0 in | Wt 255.8 lb

## 2024-07-01 DIAGNOSIS — E66812 Obesity, class 2: Secondary | ICD-10-CM

## 2024-07-01 DIAGNOSIS — I10 Essential (primary) hypertension: Secondary | ICD-10-CM | POA: Diagnosis not present

## 2024-07-01 DIAGNOSIS — E669 Obesity, unspecified: Secondary | ICD-10-CM

## 2024-07-01 DIAGNOSIS — Z23 Encounter for immunization: Secondary | ICD-10-CM | POA: Diagnosis not present

## 2024-07-01 DIAGNOSIS — Z6837 Body mass index (BMI) 37.0-37.9, adult: Secondary | ICD-10-CM

## 2024-07-01 DIAGNOSIS — Z87891 Personal history of nicotine dependence: Secondary | ICD-10-CM

## 2024-07-01 DIAGNOSIS — Z7984 Long term (current) use of oral hypoglycemic drugs: Secondary | ICD-10-CM

## 2024-07-01 DIAGNOSIS — M25552 Pain in left hip: Secondary | ICD-10-CM | POA: Insufficient documentation

## 2024-07-01 DIAGNOSIS — E782 Mixed hyperlipidemia: Secondary | ICD-10-CM

## 2024-07-01 DIAGNOSIS — E559 Vitamin D deficiency, unspecified: Secondary | ICD-10-CM

## 2024-07-01 DIAGNOSIS — E119 Type 2 diabetes mellitus without complications: Secondary | ICD-10-CM

## 2024-07-01 DIAGNOSIS — J449 Chronic obstructive pulmonary disease, unspecified: Secondary | ICD-10-CM

## 2024-07-01 LAB — TSH: TSH: 1.63 u[IU]/mL (ref 0.35–5.50)

## 2024-07-01 LAB — COMPREHENSIVE METABOLIC PANEL WITH GFR
ALT: 24 U/L (ref 0–53)
AST: 13 U/L (ref 0–37)
Albumin: 4.7 g/dL (ref 3.5–5.2)
Alkaline Phosphatase: 68 U/L (ref 39–117)
BUN: 14 mg/dL (ref 6–23)
CO2: 32 meq/L (ref 19–32)
Calcium: 9.6 mg/dL (ref 8.4–10.5)
Chloride: 93 meq/L — ABNORMAL LOW (ref 96–112)
Creatinine, Ser: 0.87 mg/dL (ref 0.40–1.50)
GFR: 89.56 mL/min (ref >60.00)
Glucose, Bld: 113 mg/dL — ABNORMAL HIGH (ref 70–99)
Potassium: 4.5 meq/L (ref 3.5–5.1)
Sodium: 132 meq/L — ABNORMAL LOW (ref 135–145)
Total Bilirubin: 0.4 mg/dL (ref 0.2–1.2)
Total Protein: 7.5 g/dL (ref 6.0–8.3)

## 2024-07-01 LAB — VITAMIN D 25 HYDROXY (VIT D DEFICIENCY, FRACTURES): VITD: 35.55 ng/mL (ref 30.00–100.00)

## 2024-07-01 MED ORDER — TIRZEPATIDE 2.5 MG/0.5ML ~~LOC~~ SOAJ: 2.5000 mg | SUBCUTANEOUS | 2 refills | Status: DC

## 2024-07-01 MED ORDER — TRAMADOL HCL 50 MG PO TABS: 50.0000 mg | ORAL_TABLET | Freq: Three times a day (TID) | ORAL | 0 refills | Status: DC | PRN

## 2024-07-01 NOTE — Assessment & Plan Note (Addendum)
 Blood pressure elevated during OV. Pt reports normal readings at home. Encourage Pt to monitor BP, bring readings to FU visit.  -BP goal 130/80. If BP >140/90, return to clinic. - Monitor blood pressure at home regularly. - Continue lisinopril , amlodipine , and carvedilol . -Encouraged heart healthy diet such as the DASH diet and exercise as tolerated.

## 2024-07-01 NOTE — Assessment & Plan Note (Signed)
 Encouraged DASH or MIND diet, decrease po intake and increase exercise as tolerated. Needs 7-8 hours of sleep nightly. Avoid trans fats, eat small, frequent meals every 4-5 hours with lean proteins, complex carbs and healthy fats. Minimize simple carbs, high fat foods and processed foods. --Rx- start mounjaro  2.5 mg inj weekly

## 2024-07-01 NOTE — Assessment & Plan Note (Signed)
 Chronic pain exacerbated by activity. Meloxicam  discouraged due to elevated BP.  - left hip x-ray pending - Referred to orthopedics - Rx tramadol  for moderate to severe pain prn

## 2024-07-01 NOTE — Progress Notes (Signed)
 Subjective:     Patient ID: Phillip Doyle, male    DOB: Apr 03, 1956, 68 y.o.   MRN: [id number]  Chief Complaint  Patient presents with   Follow-up    Patient is here for his 3 month follow up   Hip Pain    Patient stated to have hip pain, left side. Would like a referral to [facility]   Sinus Problem    Patient has had increased stuffiness for about a month. OTC medications have been used, no relief    HPI  Discussed the use of AI scribe software for clinical note transcription with the patient, who gave verbal consent to proceed.  History of Present Illness Phillip Doyle is a 68 year old male with hypertension and type 2 diabetes who presents with elevated blood pressure and hip pain.  His home blood pressure readings have been in the 130s, lower than today's reading. He is on lisinopril  40 mg, amlodipine , and carvedilol , taken twice daily.   He experiences significant left hip pain, worsened by physical activity such as walking or yard work, leading to breathlessness. Pain is rated as 8/10 on workdays and 4-5/10 when not active. No history of trauma or falls.  Follows with: Urology  HTN Amlodipine  10 mg daily, lisinopril  40 mg daily, carvedilol  25 mg twice daily, BP at home:   HLD-on simvastatin  20 mg daily  Diabetes: - Checking glucose at home: yes -Home BS: 130s fasting AM, Nighttime 140s - Medications: Metformin  1000 mg twice daily, glimepiride , Januvia  100 mg daily - Compliant with medications - Denies symptoms of hypoglycemia, polyuria, polydipsia, numbness extremities, foot ulcers/trauma, visual changes, wounds that are not healing, medication side effects   Patient denies fever, chills, SOB, CP, palpitations, dyspnea, edema, HA, vision changes, N/V/D, abdominal pain, urinary symptoms, rash, weight changes, and recent illness or hospitalizations.   History of Present Illness              Health Maintenance Due  Topic Date Due   Colonoscopy  04/12/2022    COVID-19 Vaccine (6 - 2025-26 season) 04/18/2024    Past Medical History:  Diagnosis Date   Alcohol abuse 11/28/2013   ALLERGIC RHINITIS 02/15/2007   Allergy    rhinitis   Bronchitis 07/14/2012   Diabetes mellitus    DM 10/24/2010   ED (erectile dysfunction) 02/17/2012   EXOGENOUS OBESITY 02/15/2007   H/O alcohol abuse 11/28/2013   H/O tobacco use, presenting hazards to health 07/04/2010   Qualifier: Diagnosis of  By: Domenica MD, Harlene  1/2 ppd    HTN (hypertension) 08/20/2011   Hypertension    HYPERTENSION 02/15/2007   Impaired glucose tolerance    Low testosterone  in male 08/01/2016   Low testosterone  level in male 08/01/2016   LUMBAR STRAIN 10/17/2010   Mixed hyperlipidemia 07/04/2010   NISSEN FUNDOPLICATION, HX OF 02/15/2007   OA (osteoarthritis) of knee 02/17/2012   Obesity    PEPTIC ULCER DISEASE 02/15/2007   Personal history of colonic polyps 01/17/2013   colonoscoppy 2013, benign polyp advised repeat in 10 years   Preventative health care 07/19/2012   Restless sleeper 10/30/2016   TOBACCO ABUSE 07/04/2010   Ulcer 1982   peptic ulcer disease    Past Surgical History:  Procedure Laterality Date   COLONOSCOPY  03/2012   Several hyperplastic polyps   fundiplication for [facility] and reflux  1987   negative stress test  2008   ROTATOR CUFF REPAIR  2000   VASECTOMY  1982  Family History  Problem Relation Age of Onset   Cancer Mother        uterine    Asthma Mother    Alcohol abuse Father    Cancer Sister        Brain tumor   Hypertension Brother    Diabetes Brother    Heart disease Brother        aortic valve replaced, CAD   Cancer Brother        prostate   Stroke Maternal Grandmother        possibly   Hypertension Maternal Grandfather    Colon cancer Neg Hx     Social History   Socioeconomic History   Marital status: Married    Spouse name: Stacey   Number of children: Not on file   Years of education: Not on file   Highest education level: 12th grade  Occupational  History    Employer: [employer]  Tobacco Use   Smoking status: Former    Current packs/day: 0.00    Average packs/day: 2.0 packs/day for 40.0 years (80.0 ttl pk-yrs)    Types: Cigarettes    Start date: 06/28/1974    Quit date: 06/28/2014    Years since quitting: 10.0   Smokeless tobacco: Former    Quit date: 03/18/2012   Tobacco comments:    quit 06/18/10- started back   Vaping Use   Vaping status: Not on file  Substance and Sexual Activity   Alcohol use: Yes    Alcohol/week: 3.0 - 4.0 standard drinks of alcohol    Types: 3 - 4 Cans of beer per week    Comment: 3-4 every other day   Drug use: No   Sexual activity: Yes    Partners: Female  Other Topics Concern   Not on file  Social History Narrative   Not on file   Social Drivers of Health   Financial Resource Strain: Low Risk  (03/23/2024)   Overall Financial Resource Strain (CARDIA)    Difficulty of Paying Living Expenses: Not hard at all  Food Insecurity: No Food Insecurity (03/23/2024)   Hunger Vital Sign    Worried About Running Out of Food in the Last Year: Never true    Ran Out of Food in the Last Year: Never true  Transportation Needs: No Transportation Needs (03/23/2024)   PRAPARE - Administrator, Civil Service (Medical): No    Lack of Transportation (Non-Medical): No  Physical Activity: Insufficiently Active (03/23/2024)   Exercise Vital Sign    Days of Exercise per Week: 3 days    Minutes of Exercise per Session: 20 min  Stress: No Stress Concern Present (03/23/2024)   [name] Occupational [employer] - Occupational Stress Questionnaire    Feeling of Stress: Not at all  Social Connections: Moderately Isolated (03/23/2024)   Social Connection and Isolation Panel    Frequency of Communication with Friends and Family: More than three times a week    Frequency of Social Gatherings with Friends and Family: More than three times a week    Attends Religious Services: Never    Database Administrator or  Organizations: No    Attends Engineer, Structural: Not on file    Marital Status: Married  Catering Manager Violence: Not on file    Outpatient Medications Prior to Visit  Medication Sig Dispense Refill   acetaminophen  (TYLENOL ) 650 MG CR tablet Take 1,300 mg by mouth 2 (two) times daily as needed  for pain.     albuterol  (VENTOLIN  HFA) 108 (90 Base) MCG/ACT inhaler Inhale 2 puffs into the lungs every 6 (six) hours as needed for wheezing or shortness of breath. 18 each 12   amLODipine  (NORVASC ) 10 MG tablet Take 1 tablet by mouth once daily 90 tablet 0   carvedilol  (COREG ) 25 MG tablet Take 1 tablet (25 mg total) by mouth 2 (two) times daily with a meal. 180 tablet 0   glimepiride  (AMARYL ) 1 MG tablet Take 1 tablet (1 mg total) by mouth daily with breakfast. 90 tablet 1   glucose blood (FREESTYLE LITE) test strip CHECK BLOOD SUGAR TWICE DAILY. DX: E11.9**VERIFY PATIENTS METER** 100 strip 0   Lancets (FREESTYLE) lancets Check blood sugar twice daily.  DX E11.9 100 each 6   lisinopril  (ZESTRIL ) 40 MG tablet Take 1 tablet (40 mg total) by mouth daily. 90 tablet 3   metFORMIN  (GLUCOPHAGE ) 1000 MG tablet TAKE 1 TABLET (1,000 MG TOTAL) BY MOUTH TWICE A DAY WITH FOOD 180 tablet 1   Multiple Vitamins-Minerals (PA MENS 50 PLUS VITAPAK PO) Take 1 capsule by mouth daily.     Oral Electrolytes (H-E-B ORAL ELECTROLYTE PO) Take by mouth.     simvastatin  (ZOCOR ) 20 MG tablet Take 1 tablet (20 mg total) by mouth daily at 6 PM. 90 tablet 1   meloxicam  (MOBIC ) 7.5 MG tablet Take 1 tablet (7.5 mg total) by mouth daily. 14 tablet 0   sitaGLIPtin  (JANUVIA ) 100 MG tablet Take 1 tablet (100 mg total) by mouth daily. 90 tablet 1   No facility-administered medications prior to visit.    No Known Allergies  ROS    See HPI Objective:    Physical Exam Vitals reviewed.  Constitutional:      General: He is not in acute distress.    Appearance: He is obese. He is not toxic-appearing.  HENT:     Head:  Normocephalic and atraumatic.     Mouth/Throat:     Mouth: Mucous membranes are moist.     Pharynx: Oropharynx is clear.  Eyes:     Pupils: Pupils are equal, round, and reactive to light.  Cardiovascular:     Rate and Rhythm: Normal rate and regular rhythm.     Pulses: Normal pulses.     Heart sounds: Normal heart sounds. No murmur heard. Pulmonary:     Effort: Pulmonary effort is normal. No respiratory distress.     Breath sounds: Normal breath sounds. No wheezing.  Musculoskeletal:        General: No swelling.     Cervical back: Neck supple.  Skin:    General: Skin is warm and dry.  Neurological:     General: No focal deficit present.     Mental Status: He is alert and oriented to person, place, and time.  Psychiatric:        Mood and Affect: Mood normal.        Behavior: Behavior normal.        Thought Content: Thought content normal.        Judgment: Judgment normal.      BP (!) 152/77 (BP Location: Right Arm, Cuff Size: Large)   Pulse 78   Temp 98.1 F (36.7 C) (Oral)   Resp 16   Ht 5' 9 (1.753 m)   Wt 255 lb 12.8 oz (116 kg)   SpO2 97%   BMI 37.78 kg/m  Wt Readings from Last 3 Encounters:  07/01/24 255 lb 12.8 oz (116  kg)  03/30/24 252 lb (114.3 kg)  09/23/23 248 lb (112.5 kg)       Assessment & Plan:   Problem List Items Addressed This Visit     COPD mixed type (HCC)   Denies recent exacerbations.      H/O tobacco use, presenting hazards to health   Patient is candidate for lung cancer screening, denies referral at this time.  Will respect patient's decision at this time, revisit in future visits.      HTN (hypertension)   Blood pressure elevated during OV. Pt reports normal readings at home. Encourage Pt to monitor BP, bring readings to FU visit.  -BP goal 130/80. If BP >140/90, return to clinic. - Monitor blood pressure at home regularly. - Continue lisinopril , amlodipine , and carvedilol . -Encouraged heart healthy diet such as the DASH diet  and exercise as tolerated.        Relevant Orders   TSH   Left hip pain   Chronic pain exacerbated by activity. Meloxicam  discouraged due to elevated BP.  - left hip x-ray pending - Referred to orthopedics - Rx tramadol  for moderate to severe pain prn      Relevant Medications   traMADol  (ULTRAM ) 50 MG tablet   Other Relevant Orders   DG HIP UNILAT W OR W/O PELVIS 2-3 VIEWS LEFT   Ambulatory referral to Orthopedic Surgery   Mixed hyperlipidemia   Tolerating statin.Encourage heart healthy diet such as MIND or DASH diet, increase exercise, avoid trans fats, simple carbohydrates and processed foods, consider a krill or fish or flaxseed oil cap daily.        Obesity (Chronic)   Encouraged DASH or MIND diet, decrease po intake and increase exercise as tolerated. Needs 7-8 hours of sleep nightly. Avoid trans fats, eat small, frequent meals every 4-5 hours with lean proteins, complex carbs and healthy fats. Minimize simple carbs, high fat foods and processed foods. --Rx- start mounjaro  2.5 mg inj weekly      Relevant Medications   tirzepatide  (MOUNJARO ) 2.5 MG/0.5ML Pen   Type 2 diabetes mellitus in patient with obesity (HCC) - Primary   A1C goal <7% On statin  On ace-i Medications: Metformin  1000 mg twice daily, glimepiride  1 mg daily Discontinue Januvia , start Mounjaro  2.5 mg inj weekly Medication and common side effects reviewed with the patient; patient voiced understanding and had no further questions at this time.        Relevant Medications   tirzepatide  (MOUNJARO ) 2.5 MG/0.5ML Pen   Other Relevant Orders   HgB A1c   Comp Met (CMET)   Other Visit Diagnoses       Vitamin D  deficiency       Relevant Orders   Vitamin D  (25 hydroxy)   TSH     Immunization due       Relevant Orders   Flu vaccine HIGH DOSE PF(Fluzone Trivalent) (Completed)       General Health Maintenance Flu vaccination discussed. - Administered flu shot  I have discontinued Phillip Doyle's  meloxicam  and sitaGLIPtin . I am also having him start on tirzepatide  and traMADol . Additionally, I am having him maintain his Multiple Vitamins-Minerals (PA MENS 50 PLUS VITAPAK PO), acetaminophen , Oral Electrolytes (H-E-B ORAL ELECTROLYTE PO), FREESTYLE LITE, freestyle, lisinopril , albuterol , metFORMIN , glimepiride , simvastatin , carvedilol , and amLODipine .  Meds ordered this encounter  Medications   tirzepatide  (MOUNJARO ) 2.5 MG/0.5ML Pen    Sig: Inject 2.5 mg into the skin once a week.    Dispense:  2 mL  Refill:  2    Supervising Provider:   DOMENICA BLACKBIRD A [[id number]]   traMADol  (ULTRAM ) 50 MG tablet    Sig: Take 1 tablet (50 mg total) by mouth every 8 (eight) hours as needed for up to 5 days.    Dispense:  15 tablet    Refill:  0    Supervising Provider:   DOMENICA BLACKBIRD A [[id number]]

## 2024-07-01 NOTE — Assessment & Plan Note (Signed)
 Tolerating statin. Encourage heart healthy diet such as MIND or DASH diet, increase exercise, avoid trans fats, simple carbohydrates and processed foods, consider a krill or fish or flaxseed oil cap daily.

## 2024-07-01 NOTE — Assessment & Plan Note (Addendum)
 A1C goal <7% On statin  On ace-i Medications: Metformin  1000 mg twice daily, glimepiride  1 mg daily Discontinue Januvia , start Mounjaro  2.5 mg inj weekly Medication and common side effects reviewed with the patient; patient voiced understanding and had no further questions at this time.

## 2024-07-01 NOTE — Assessment & Plan Note (Signed)
 Patient is candidate for lung cancer screening, denies referral at this time.  Will respect patient's decision at this time, revisit in future visits.

## 2024-07-01 NOTE — Assessment & Plan Note (Signed)
 Denies recent exacerbations.

## 2024-07-04 ENCOUNTER — Ambulatory Visit: Payer: Self-pay | Admitting: Student

## 2024-07-04 LAB — HEMOGLOBIN A1C: Hgb A1c MFr Bld: 7.1 % — ABNORMAL HIGH (ref 4.6–6.5)

## 2024-07-05 ENCOUNTER — Other Ambulatory Visit: Payer: Self-pay

## 2024-07-05 ENCOUNTER — Telehealth: Payer: Self-pay | Admitting: Family Medicine

## 2024-07-05 ENCOUNTER — Other Ambulatory Visit

## 2024-07-05 DIAGNOSIS — I10 Essential (primary) hypertension: Secondary | ICD-10-CM

## 2024-07-05 DIAGNOSIS — E782 Mixed hyperlipidemia: Secondary | ICD-10-CM

## 2024-07-05 LAB — LIPID PANEL
Cholesterol: 123 mg/dL (ref <200)
HDL: 50 mg/dL (ref >=40)
LDL Cholesterol (Calc): 51 mg/dL
Non-HDL Cholesterol (Calc): 73 mg/dL (ref <130)
Total CHOL/HDL Ratio: 2.5 (calc) (ref <5.0)
Triglycerides: 135 mg/dL (ref <150)

## 2024-07-05 MED ORDER — VITAMIN D3 25 MCG (1000 UT) PO CAPS: 1000.0000 [IU] | ORAL_CAPSULE | Freq: Every day | ORAL | Status: AC

## 2024-07-05 NOTE — Telephone Encounter (Signed)
 Pts wife dropped off a form for her husband. She stated she had already dropped this paper off before and pcp needed another one. She said she filled out the form for dropping off papers when she dropped the last one off and did not want to fill it out again. Form placed in pcps box.

## 2024-07-06 ENCOUNTER — Ambulatory Visit: Payer: Self-pay | Admitting: Student

## 2024-07-06 NOTE — Telephone Encounter (Signed)
 Received form and it was placed in PCP box to be signed.

## 2024-08-20 ENCOUNTER — Other Ambulatory Visit: Payer: Self-pay | Admitting: Student

## 2024-08-20 ENCOUNTER — Other Ambulatory Visit: Payer: Self-pay | Admitting: Family

## 2024-08-20 DIAGNOSIS — M25552 Pain in left hip: Secondary | ICD-10-CM

## 2024-08-20 DIAGNOSIS — I10 Essential (primary) hypertension: Secondary | ICD-10-CM

## 2024-08-20 DIAGNOSIS — E669 Obesity, unspecified: Secondary | ICD-10-CM

## 2024-08-23 MED ORDER — AMLODIPINE BESYLATE 10 MG PO TABS: 10.0000 mg | ORAL_TABLET | Freq: Every day | ORAL | 0 refills | Status: AC

## 2024-08-23 NOTE — Addendum Note (Signed)
 Addended by: WHEELER HARLENE CROME on: 08/23/2024 10:47 AM   Modules accepted: Orders

## 2024-08-30 ENCOUNTER — Encounter: Payer: Self-pay | Admitting: *Deleted

## 2024-09-01 ENCOUNTER — Other Ambulatory Visit: Payer: Self-pay | Admitting: Family

## 2024-09-01 DIAGNOSIS — I10 Essential (primary) hypertension: Secondary | ICD-10-CM

## 2024-09-02 ENCOUNTER — Telehealth: Payer: Self-pay

## 2024-09-02 NOTE — Telephone Encounter (Signed)
 PA initiated via Covermymeds; KEY: [id number]. Awaiting determination.

## 2024-09-02 NOTE — Telephone Encounter (Signed)
 PA appoved. Effective 09/02/2024 - 03/02/2025

## 2024-09-07 ENCOUNTER — Other Ambulatory Visit: Payer: Self-pay | Admitting: Family

## 2024-09-07 DIAGNOSIS — I10 Essential (primary) hypertension: Secondary | ICD-10-CM

## 2024-09-15 ENCOUNTER — Other Ambulatory Visit: Payer: Self-pay | Admitting: Family

## 2024-09-15 DIAGNOSIS — I10 Essential (primary) hypertension: Secondary | ICD-10-CM

## 2024-09-18 ENCOUNTER — Other Ambulatory Visit: Payer: Self-pay | Admitting: Student

## 2024-09-18 DIAGNOSIS — E669 Obesity, unspecified: Secondary | ICD-10-CM

## 2024-10-05 ENCOUNTER — Ambulatory Visit: Admitting: Student

## 2024-10-24 ENCOUNTER — Ambulatory Visit: Admitting: Family Medicine
